# Patient Record
Sex: Male | Born: 1970
Health system: Southern US, Community
[De-identification: ages and names within clinical notes are randomized; demographics above are authoritative.]

## PROBLEM LIST (undated history)

## (undated) DIAGNOSIS — Z95810 Presence of automatic (implantable) cardiac defibrillator: Secondary | ICD-10-CM

## (undated) DIAGNOSIS — I499 Cardiac arrhythmia, unspecified: Secondary | ICD-10-CM

## (undated) DIAGNOSIS — I498 Other specified cardiac arrhythmias: Secondary | ICD-10-CM

## (undated) DIAGNOSIS — E785 Hyperlipidemia, unspecified: Secondary | ICD-10-CM

## (undated) DIAGNOSIS — R55 Syncope and collapse: Secondary | ICD-10-CM

## (undated) DIAGNOSIS — Z9289 Personal history of other medical treatment: Secondary | ICD-10-CM

## (undated) DIAGNOSIS — Z950 Presence of cardiac pacemaker: Secondary | ICD-10-CM

## (undated) DIAGNOSIS — I82409 Acute embolism and thrombosis of unspecified deep veins of unspecified lower extremity: Secondary | ICD-10-CM

## (undated) HISTORY — PX: FINGER TENDON REPAIR: SHX1640

## (undated) HISTORY — DX: Personal history of other medical treatment: Z92.89

## (undated) HISTORY — DX: Syncope and collapse: R55

## (undated) HISTORY — DX: Other specified cardiac arrhythmias: I49.8

## (undated) HISTORY — DX: Hyperlipidemia, unspecified: E78.5

## (undated) HISTORY — PX: BACK SURGERY: SHX140

---

## 1995-03-14 HISTORY — PX: LUMBAR DISC SURGERY: SHX700

## 1999-05-02 ENCOUNTER — Emergency Department (HOSPITAL_COMMUNITY): Admission: EM | Admit: 1999-05-02 | Discharge: 1999-05-02 | Payer: Self-pay

## 2000-03-29 ENCOUNTER — Ambulatory Visit (HOSPITAL_BASED_OUTPATIENT_CLINIC_OR_DEPARTMENT_OTHER): Admission: RE | Admit: 2000-03-29 | Discharge: 2000-03-29 | Payer: Self-pay | Admitting: *Deleted

## 2000-03-29 ENCOUNTER — Emergency Department (HOSPITAL_COMMUNITY): Admission: EM | Admit: 2000-03-29 | Discharge: 2000-03-29 | Payer: Self-pay | Admitting: Emergency Medicine

## 2000-03-29 ENCOUNTER — Encounter: Payer: Self-pay | Admitting: Emergency Medicine

## 2000-04-11 ENCOUNTER — Ambulatory Visit (HOSPITAL_BASED_OUTPATIENT_CLINIC_OR_DEPARTMENT_OTHER): Admission: RE | Admit: 2000-04-11 | Discharge: 2000-04-11 | Payer: Self-pay | Admitting: *Deleted

## 2000-07-16 ENCOUNTER — Emergency Department (HOSPITAL_COMMUNITY): Admission: EM | Admit: 2000-07-16 | Discharge: 2000-07-17 | Payer: Self-pay | Admitting: Emergency Medicine

## 2000-07-17 ENCOUNTER — Encounter: Payer: Self-pay | Admitting: Emergency Medicine

## 2000-08-07 ENCOUNTER — Encounter: Payer: Self-pay | Admitting: Neurosurgery

## 2000-08-07 ENCOUNTER — Ambulatory Visit (HOSPITAL_COMMUNITY): Admission: RE | Admit: 2000-08-07 | Discharge: 2000-08-07 | Payer: Self-pay | Admitting: Neurosurgery

## 2001-03-10 ENCOUNTER — Emergency Department (HOSPITAL_COMMUNITY): Admission: EM | Admit: 2001-03-10 | Discharge: 2001-03-10 | Payer: Self-pay | Admitting: Emergency Medicine

## 2001-03-10 ENCOUNTER — Encounter: Payer: Self-pay | Admitting: Emergency Medicine

## 2005-02-01 ENCOUNTER — Encounter: Admission: RE | Admit: 2005-02-01 | Discharge: 2005-05-02 | Payer: Self-pay | Admitting: Otolaryngology

## 2006-02-10 HISTORY — PX: CARDIAC DEFIBRILLATOR PLACEMENT: SHX171

## 2006-02-14 ENCOUNTER — Ambulatory Visit: Payer: Self-pay | Admitting: Cardiology

## 2006-02-15 ENCOUNTER — Ambulatory Visit: Payer: Self-pay

## 2006-02-15 ENCOUNTER — Encounter: Payer: Self-pay | Admitting: Cardiology

## 2006-02-15 ENCOUNTER — Ambulatory Visit: Payer: Self-pay | Admitting: Internal Medicine

## 2006-02-16 ENCOUNTER — Ambulatory Visit: Payer: Self-pay | Admitting: Internal Medicine

## 2006-02-16 ENCOUNTER — Inpatient Hospital Stay (HOSPITAL_COMMUNITY): Admission: RE | Admit: 2006-02-16 | Discharge: 2006-02-17 | Payer: Self-pay | Admitting: Internal Medicine

## 2006-02-27 ENCOUNTER — Ambulatory Visit: Payer: Self-pay | Admitting: Internal Medicine

## 2006-03-14 ENCOUNTER — Ambulatory Visit: Payer: Self-pay | Admitting: Internal Medicine

## 2006-04-04 ENCOUNTER — Ambulatory Visit: Payer: Self-pay | Admitting: Cardiology

## 2006-04-04 LAB — CONVERTED CEMR LAB
HCT: 42.1 % (ref 39.0–52.0)
Hemoglobin: 14.4 g/dL (ref 13.0–17.0)
MCHC: 34.2 g/dL (ref 30.0–36.0)
MCV: 92.9 fL (ref 78.0–100.0)
Platelets: 288 10*3/uL (ref 150–400)
RBC: 4.53 M/uL (ref 4.22–5.81)
RDW: 12.2 % (ref 11.5–14.6)
WBC: 7.3 10*3/uL (ref 4.5–10.5)

## 2006-05-23 ENCOUNTER — Ambulatory Visit: Payer: Self-pay | Admitting: Internal Medicine

## 2006-09-18 ENCOUNTER — Ambulatory Visit: Payer: Self-pay | Admitting: Internal Medicine

## 2006-10-16 ENCOUNTER — Ambulatory Visit: Payer: Self-pay | Admitting: Internal Medicine

## 2006-12-24 ENCOUNTER — Ambulatory Visit: Payer: Self-pay | Admitting: Internal Medicine

## 2007-03-25 ENCOUNTER — Ambulatory Visit: Payer: Self-pay | Admitting: Internal Medicine

## 2007-09-23 ENCOUNTER — Ambulatory Visit: Payer: Self-pay | Admitting: Internal Medicine

## 2007-12-23 ENCOUNTER — Ambulatory Visit: Payer: Self-pay | Admitting: Internal Medicine

## 2008-04-30 ENCOUNTER — Encounter: Payer: Self-pay | Admitting: Internal Medicine

## 2008-05-04 ENCOUNTER — Ambulatory Visit: Payer: Self-pay | Admitting: Internal Medicine

## 2008-08-03 ENCOUNTER — Ambulatory Visit: Payer: Self-pay | Admitting: Internal Medicine

## 2008-08-28 ENCOUNTER — Encounter: Payer: Self-pay | Admitting: Internal Medicine

## 2008-11-25 ENCOUNTER — Encounter: Payer: Self-pay | Admitting: Internal Medicine

## 2008-12-21 ENCOUNTER — Encounter: Payer: Self-pay | Admitting: Internal Medicine

## 2009-02-01 ENCOUNTER — Ambulatory Visit: Payer: Self-pay | Admitting: Internal Medicine

## 2009-02-01 DIAGNOSIS — Q248 Other specified congenital malformations of heart: Secondary | ICD-10-CM | POA: Insufficient documentation

## 2009-02-01 DIAGNOSIS — R55 Syncope and collapse: Secondary | ICD-10-CM | POA: Insufficient documentation

## 2009-02-22 ENCOUNTER — Telehealth: Payer: Self-pay | Admitting: Internal Medicine

## 2009-02-23 ENCOUNTER — Telehealth: Payer: Self-pay | Admitting: Internal Medicine

## 2009-03-30 ENCOUNTER — Telehealth: Payer: Self-pay | Admitting: Internal Medicine

## 2009-05-07 ENCOUNTER — Encounter: Payer: Self-pay | Admitting: Internal Medicine

## 2009-05-19 ENCOUNTER — Encounter: Payer: Self-pay | Admitting: Internal Medicine

## 2009-05-20 ENCOUNTER — Ambulatory Visit: Payer: Self-pay | Admitting: Internal Medicine

## 2009-06-02 ENCOUNTER — Telehealth: Payer: Self-pay | Admitting: Internal Medicine

## 2009-06-08 ENCOUNTER — Encounter: Payer: Self-pay | Admitting: Internal Medicine

## 2009-07-27 ENCOUNTER — Ambulatory Visit: Payer: Self-pay | Admitting: Internal Medicine

## 2009-07-27 DIAGNOSIS — Z95810 Presence of automatic (implantable) cardiac defibrillator: Secondary | ICD-10-CM | POA: Insufficient documentation

## 2009-10-29 ENCOUNTER — Encounter: Payer: Self-pay | Admitting: Internal Medicine

## 2009-11-22 ENCOUNTER — Ambulatory Visit: Payer: Self-pay | Admitting: Internal Medicine

## 2009-11-29 ENCOUNTER — Encounter: Payer: Self-pay | Admitting: Internal Medicine

## 2010-03-03 ENCOUNTER — Ambulatory Visit: Payer: Self-pay | Admitting: Internal Medicine

## 2010-03-08 ENCOUNTER — Encounter: Payer: Self-pay | Admitting: Internal Medicine

## 2010-03-22 ENCOUNTER — Encounter: Payer: Self-pay | Admitting: Internal Medicine

## 2010-04-10 ENCOUNTER — Encounter (INDEPENDENT_AMBULATORY_CARE_PROVIDER_SITE_OTHER): Payer: Self-pay | Admitting: *Deleted

## 2010-04-14 NOTE — Letter (Signed)
Summary: Device-Delinquent Phone Journalist, newspaper, Main Office  [address]   [place], [place] [address]   Phone: [phone]  Fax: [phone]     March 08, 2010 MRN: [id number]   [facility] [place] [address] [place], [place]  [address]   Dear Mr. NAZIR,  According to our records, you were scheduled for a device phone transmission on 03-03-2010.     We did not receive any results from this check.  If you transmitted on your scheduled day, please call us to help troubleshoot your system.  If you forgot to send your transmission, please send one upon receipt of this letter.  Thank you,   Architectural technologist [facility]

## 2010-04-14 NOTE — Assessment & Plan Note (Signed)
Summary: device/saf   Visit Type:  Follow-up Primary Provider:  None   History of Present Illness: Chad [name] returns today for followup.  Chad [name] is a 40 yo man with a h/o syncope and a Brugada pattern on his ECG who underwent ICD implant several years ago.  Chad [name] denies c/p, sob, or peripheral edema.  Chad [name] has had no trauma since his last clinic visit.  No intercurrent therapies from his ICD.  Chad [name] recently stopped smoking.  Current Medications (verified): 1)  Simvastatin 20 Mg Tabs (Simvastatin) .... Take One Tablet By Mouth Daily At Bedtime 2)  Omeprazole 20 Mg Cpdr (Omeprazole) .... As Needed 3)  Folic Acid 1 Mg Tabs (Folic Acid) .... Take One Tablet By Mouth Once Daily. 4)  Aspirin 81 Mg Tbec (Aspirin) .... Take One Tablet By Mouth Daily  Allergies (verified): No Known Drug Allergies  Past History:  Past Medical History: Last updated: 01/30/2008 Syncope with Brugada syndrome s/p ICD 12/07 GERD  Past Surgical History: Last updated: 01/30/2008 ICD (St. Jude Atlas) 2007  Review of Systems  Chad [name] denies chest pain, syncope, dyspnea on exertion, and peripheral edema.    Vital Signs:  [name] profile:   40 year old male Height:      67 inches Weight:      153 pounds BMI:     24.05 Pulse rate:   73 / minute BP sitting:   110 / 80  (left arm)  Vitals Entered By: Laurance Flatten CMA (Jul 27, 2009 11:51 AM)  Physical Exam  General:  Well developed, well nourished, in no acute distress.  HEENT: normal Neck: supple. No JVD. Carotids 2+ bilaterally no bruits Cor: RRR no rubs, gallops or murmur Lungs: CTA. Well healed ICD incision. Ab: soft, nontender. nondistended. No HSM. Good bowel sounds Ext: warm. no cyanosis, clubbing or edema Neuro: alert and oriented. Grossly nonfocal. affect pleasant     ICD Specifications Following MD:  Lewayne Bunting, MD     ICD Vendor:  St Jude     ICD Model Number:  [phone]     ICD Serial Number:  [id number] ICD DOI:  02/16/2006     ICD Implanting MD:   Lewayne Bunting, MD  Lead 1:    Location: RV     DOI: 02/16/2006     Model #: [id number]     Serial #: [id number]     Status: active  Indications::  BRUGADA SYNDROME   ICD Follow Up Remote Check?  No Battery Voltage:  2.97 V     Charge Time:  10.0 seconds     Underlying rhythm:  SR ICD Dependent:  No       ICD Device Measurements Right Ventricle:  Amplitude: 7.9 mV, Impedance: 465 ohms, Threshold: 4.5 V at 1.0 msec  Episodes Shock:  0     ATP:  0     Nonsustained:  0     Ventricular Pacing:  <1%  Brady Parameters Mode VVI     Lower Rate Limit:  40      Tachy Zones VF:  200     Next Remote Date:  10/28/2009     Next Cardiology Appt Due:  07/12/2010 Tech Comments:  No parameter changes.  Device function normal.  Atlas device battery 2.97.  Merlin transmissions every 3 months.  ROV 1 year with Dr. Ladona Ridgel. Altha Harm, LPN  Jul 27, 2009 11:56 AM  MD Comments:  Agree with above.  Impression & Recommendations:  Problem # 1:  AUTOMATIC  IMPLANTABLE CARDIAC DEFIBRILLATOR SITU (ICD-V45.02) His device is working normally at present.  Will recheck in several months.  Problem # 2:  OTHER SPECIFIED CONGENITAL ANOMALY HEART OTHER [id number]) Chad [name] has no ventricular arrhythmias and currently remains stable with his Brugada pattern on ECG. His updated medication list for this problem includes:    Aspirin 81 Mg Tbec (Aspirin) .Marland Kitchen... Take one tablet by mouth daily  [name] Instructions: 1)  Your physician recommends that you schedule a follow-up appointment in: 12 months with Dr Ladona Ridgel

## 2010-04-14 NOTE — Letter (Signed)
Summary: Device-Delinquent Phone Journalist, newspaper, Main Office  [address]   [place], [place] [address]   Phone: [phone]  Fax: [phone]     May 07, 2009 MRN: [id number]   [facility] [place] [address] [place], [place]  [address]   Dear Chad [name],  According to our records, you were scheduled for a device phone transmission on  May 03, 2009.     We did not receive any results from this check.  If you transmitted on your scheduled day, please call us to help troubleshoot your system.  If you forgot to send your transmission, please send one upon receipt of this letter.  Thank you,   Architectural technologist Device Clinic

## 2010-04-14 NOTE — Progress Notes (Signed)
Summary: Folic Acid refill  Phone Note Call from Patient Call back at [phone]   Caller: Patient Summary of Call: QUESTION ABOUT MEDICATION Initial call taken by: Judie Grieve,  June 02, 2009 11:45 AM    Prescriptions: FOLIC ACID 1 MG TABS (FOLIC ACID) Take one tablet by mouth once daily.  #30 x 11   Entered by:   Dennis Bast, RN, BSN   Authorized by:   Laren Boom, MD, [facility]   Signed by:   Dennis Bast, RN, BSN on 06/02/2009   Method used:   Electronically to        [facility]* (retail)       [address]       [place], [place]  [address]       Ph: [phone]       Fax: [phone]   RxID:   [id number]

## 2010-04-14 NOTE — Cardiovascular Report (Signed)
Summary: [facility]   [facility]   Imported By: Roderic Ovens 08/13/2009 15:47:33  _____________________________________________________________________  External Attachment:    Type:   Image     Comment:   External Document

## 2010-04-14 NOTE — Letter (Signed)
Summary: Device-Delinquent Phone Journalist, newspaper, Main Office  [address]   [place], [place] [address]   Phone: [phone]  Fax: [phone]     October 29, 2009 MRN: [id number]   [facility] [place] [address] [place], [place]  [address]   Dear Mr. BRUSCA,  According to our records, you were scheduled for a device phone transmission on   10-28-2009.     We did not receive any results from this check.  If you transmitted on your scheduled day, please call us to help troubleshoot your system.  If you forgot to send your transmission, please send one upon receipt of this letter.  Thank you,   Architectural technologist [facility] Clinic

## 2010-04-14 NOTE — Letter (Signed)
Summary: Remote Device Check  [employer], Main Office  [address]   [place], [place] [address]   Phone: [phone]  Fax: [phone]     June 08, 2009 MRN: [id number]   [facility] [place] [address] [place], [place]  [address]   Dear Mr. Winiarski,   Your remote transmission was recieved and reviewed by your physician.  All diagnostics were within normal limits for you.    __X____Your next office visit is scheduled for:     MAY 2011 WITH DR Ladona Ridgel. Please call our office to schedule an appointment.    Sincerely,  Proofreader

## 2010-04-14 NOTE — Letter (Signed)
Summary: Remote Device Check  [employer], Main Office  [address]   [place], [place] [address]   Phone: [phone]  Fax: [phone]     November 29, 2009 MRN: [id number]   [facility] [address] [place], [place]  [address]   Dear Chad [name],   Your remote transmission was recieved and reviewed by your physician.  All diagnostics were within normal limits for you.  __X___Your next transmission is scheduled for:  03-03-2010.  Please transmit at any time this day.  If you have a wireless device your transmission will be sent automatically.   Sincerely,  Vella Kohler

## 2010-04-14 NOTE — Cardiovascular Report (Signed)
Summary: Office Visit Remote   Office Visit Remote   Imported By: Roderic Ovens 06/10/2009 11:57:31  _____________________________________________________________________  External Attachment:    Type:   Image     Comment:   External Document

## 2010-04-14 NOTE — Progress Notes (Signed)
Summary: pt needs to pick up Rx  Phone Note Refill Request Call back at [phone] Message from:  Patient  Refills Requested: Medication #1:  FOLIC ACID 1 MG TABS Take one tablet by mouth once daily. pt needs to pick up Rx written so they can come pick it up please call whrn ready  Initial call taken by: Omer Jack,  March 30, 2009 12:11 PM Caller: Spouse    Prescriptions: FOLIC ACID 1 MG TABS (FOLIC ACID) Take one tablet by mouth once daily.  #30 x 0   Entered by:   Laurance Flatten CMA   Authorized by:   Laren Boom, MD, [facility]   Signed by:   Laurance Flatten CMA on 04/02/2009   Method used:   Electronically to        [facility]* (retail)       [address]       [place], [place]  [address]       Ph: [phone]       Fax: [phone]   RxID:   [id number]

## 2010-04-14 NOTE — Cardiovascular Report (Signed)
Summary: Office Visit Remote   Office Visit Remote   Imported By: Roderic Ovens 11/30/2009 12:44:39  _____________________________________________________________________  External Attachment:    Type:   Image     Comment:   External Document

## 2010-04-20 NOTE — Cardiovascular Report (Signed)
Summary: Office Visit Remote   Office Visit Remote   Imported By: Roderic Ovens 04/11/2010 12:20:40  _____________________________________________________________________  External Attachment:    Type:   Image     Comment:   External Document

## 2010-04-20 NOTE — Letter (Signed)
Summary: Remote Device Check  [employer], Main Office  [address]   [place], [place] [address]   Phone: [phone]  Fax: [phone]     April 10, 2010 MRN: [id number]   [facility] [place] [address] [place], [place]  [address]   Dear Mr. Cancio,   Your remote transmission was recieved and reviewed by your physician.  All diagnostics were within normal limits for you.  __X____Your next office visit is scheduled for: May 2012 with Dr Ladona Ridgel. Please call our office to schedule an appointment.    Sincerely,  Vella Kohler

## 2010-06-23 ENCOUNTER — Other Ambulatory Visit: Payer: Self-pay | Admitting: Internal Medicine

## 2010-07-26 NOTE — Assessment & Plan Note (Signed)
[facility]                         ELECTROPHYSIOLOGY OFFICE NOTE   NAME:NGOKairos, Panetta                             MRN:          [id number]  DATE:10/16/2006                            DOB:          10/07/1970    SUBJECTIVE:  Chad [name] returns today for followup.  He is a very pleasant  young man with syncope and a regatta syndrome, status post ICD  insertion.  He returns today for an unscheduled visit complaining of  feeling anxious and depressed and tired.  He denies any other specific  complaints other than the above.  He denies chest pain.  He denies  shortness of breath.  The patient states that his symptoms occur after  he has worked during the daytime, and that when he gets home he feels  fatigued and weak and has no energy.  Otherwise he had no specific  complaints today.   PHYSICAL EXAMINATION:  GENERAL:  He is a pleasant, well-appearing  [place] man, in no distress.  VITAL SIGNS:  Blood pressure 118/72, pulse 76 and regular, respirations  18, weight 168 pounds.  NECK:  No jugular venous distention.  LUNGS:  Clear bilaterally to auscultation.  No wheezes, rales or rhonchi  are present.  CARDIOVASCULAR:  A regular rate and rhythm.  Normal S1 and S2.  EXTREMITIES:  Demonstrate no edema.   Interrogation of his defibrillator demonstrates a Social research officer, government [phone].  The R-waves were 5, the impedance 415 ohm's, threshold of 1 volt at 0.5.  Battery voltage was 3.2 volts.   IMPRESSION:  1. Syncope with regatta syndrome.  2. Status post implantable cardioverter defibrillator.  3. Fatigue, weakness and feeling tired.   DISCUSSION:  I am not sure what Mr. Rosko etiology of his symptoms is  from.  I have asked that he start an exercise program, particularly when  he feels weak and tired.  He otherwise appears to be stable, and I will  plan to see him back as previously scheduled.     Doylene Canning. Ladona Ridgel, MD  Electronically Signed    GWT/MedQ  DD:  10/16/2006  DT: 10/16/2006  Job #: [id number]

## 2010-07-26 NOTE — Assessment & Plan Note (Signed)
[facility]                         ELECTROPHYSIOLOGY OFFICE NOTE   NAME:Chad [name], Chad [name]                             MRN:          [id number]  DATE:09/23/2007                            DOB:          Jan 04, 1971    Mr. Celestin returns today for followup.  He is a very pleasant [place]  male with syncope and Brugada syndrome who underwent ICD implantation  approximately 2 years ago.  He since then had no syncope.  Initially  after his device was placed, he got a shock for his sinus tachycardia.  He returns today for followup.  He denies chest pain.  He denies  shortness of breath.  Overall, he is well.   MEDICINES:  1. Aspirin 81 a day.  2. Simvastatin 20 a day.  3. Omeprazole 20 a day.  4. Folate daily.   PHYSICAL EXAMINATION:  GENERAL:  He is a pleasant well-appearing  [place] man, in no acute distress.  VITAL SIGNS:  Blood pressure was 108/80, the pulse was 85 and regular,  the respirations were 18, and the weight was 151 pounds  NECK:  No jugular venous distention.  LUNGS:  Clear bilaterally to auscultation.  No wheezes, rales, or  rhonchi were present.  CARDIOVASCULAR:  Regular rate and rhythm with normal S1 and S2.  There  were no murmurs, rubs, or gallops present.  ABDOMEN:  Soft, nontender,  and nondistended.  There was no organomegaly.  EXTREMITIES:  No edema.   Interrogation of his defibrillator demonstrates a Social research officer, government [phone].  R waves were 10, the impedance 475, a threshold of volt at 0.5  milliseconds, the battery voltage was 3.15 volts.  He was less than 1% V  paced.   IMPRESSION:  1. Probable Brugada syndrome based on EKG in a patient with a history      of syncope.  2. Status post implantable cardioverter-defibrillator insertion.   DISCUSSION:  Overall, Mr. Urenda is stable.  His defibrillator is working  normally.  He has had no intercurrent ICD therapies, and he has had no  syncope since his device was implanted.  I will plan  to see the patient  back in several months.     Doylene Canning. Ladona Ridgel, MD  Electronically Signed    GWT/MedQ  DD: 09/23/2007  DT: 09/24/2007  Job #: [id number]

## 2010-07-26 NOTE — Assessment & Plan Note (Signed)
[facility]                         ELECTROPHYSIOLOGY OFFICE NOTE   NAME:NGOHaward, [name]                             MRN:          [id number]  DATE:09/18/2006                            DOB:          1970/09/11    Chad [name] returns today for followup.  He is a very pleasant young man  with a history of syncope and Brugada EKG, who is status post ICD  implantation.  The patient is here today after receiving an ICD  discharge, which occurred several days ago while he was playing  volleyball.  He denied preceding chest pain.  He has had palpitations  previously but did not really experience any palpitations prior to this  episode.   Aspirin is his only medication.   PHYSICAL EXAMINATION:  GENERAL:  He is a pleasant young man in no acute  distress.  VITAL SIGNS:  Blood pressure was 112/78, pulse 65 and regular.  Respirations 18.  Weight was 158 pounds.  NECK:  No jugular venous distention.  LUNGS:  Clear bilaterally to auscultation.  There are no wheezes, rales  or rhonchi are present.  CARDIOVASCULAR:  Regular rate and rhythm with a normal S1 and S2.  EXTREMITIES:  No edema.   Interrogation of his defibrillator demonstrates a Social research officer, government [phone].  The R waves are 8.  The impedance is 455 ohm.  The threshold is 1 volt  at 0.5.  The battery voltage is 3.15 volts.  There was one ICD shock, as  previously noted.  He also had 26 ATPs secondary to higher rates.   Today we re-programmed his device, getting rid of the monitor zone for  VT and taking his VT rate up to 200 beats per minute.  We also increased  the number required for treatment to 16.   IMPRESSION:  1. Implantable cardioverter/defibrillator shock secondary to sinus      tachycardia.  2. Likely Brugada syndrome with syncope and a Brugada      electrocardiogram.  3. Status post implantable cardioverter/defibrillator  insertion.   Chad [name] has stable pacemaker defibrillator program.  Adjustments  have  been made, as previously outlined.  We will plan to see him back in  several months.     Doylene Canning. Ladona Ridgel, MD  Electronically Signed    GWT/MedQ  DD: 09/18/2006  DT: 09/18/2006  Job #: [id number]

## 2010-07-29 NOTE — Consult Note (Signed)
[facility]  Patient:    Chad [name], Chad [name] Visit Number: [id number] MRN: [id number]          Service Type: Attending:  Artist Pais. Mina Marble, M.D. Dictated by:   Artist Pais Mina Marble, M.D.                            Consultation Report  REQUESTING PHYSICIAN:  Osvaldo Human, M.D.  REASON FOR CONSULTATION:  Mr. 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  Got his nondominant left long finger crushed and presents today with an open injury.  He is an otherwise 40 year old male.  ALLERGIES:  No known drug allergies.  CURRENT MEDICATIONS:  None.  PAST SURGICAL HISTORY:  No recent hospitalization or surgery.  FAMILY HISTORY:  Noncontributory.  SOCIAL HISTORY:  Noncontributory.  PHYSICAL EXAMINATION  GENERAL:  Well-developed, well-nourished male, pleasant, alert and oriented x 3.  EXTREMITIES:  Long finger:  He has an obvious open injury to the distal aspect of his left long finger.  He has a fractured nail plate and open nail bed laceration and probable distal phalangeal fracture.  He has normal sensation in the other digits.  The tip of his long finger, however, is insensate secondary to the crush component.  He also has a volar laceration that extends from radial to ulnar on the distal phalanx.  He can make a composite fist.  He has 2+ radial pulse ______.  Skin is otherwise intact.  No erythema, drainage, signs of infection.  LABORATORIES:  X-rays show a nondisplaced distal phalangeal fracture.  IMPRESSION:  Approximately a 40 year old male with an open distal phalangeal fracture, nail plate fracture, nail bed laceration.  Patient was anesthetized with 1% plain lidocaine.  After adequate anesthesia was obtained he was prepped and draped in the usual sterile fashion.  The fractured nail plate was carefully removed from underneath the eponychial fold.  The open fracture  was debrided of clot and nonviable material including large amounts of grease.  It was copiously irrigated with Betadine and normal saline until the majority of the grease was removed.  At this point in time the skin was approximated using 5-0 nylon.  The nail bed was repaired with 6-0 undyed Vicryl and after this was done the patient was placed in a sterile dressing of xeroform 4 x 4 Plus and a Coban wrap as well as a volar splint.  Patient tolerated procedure well. Was discharged from the emergency department with Keflex 500 mg one p.o. q.i.d. for a week, Vicodin #30 for pain.  Follow up in my office in five to seven days. Dictated by:   Artist Pais Mina Marble, M.D. Attending:  Artist Pais Mina Marble, M.D. DD:  03/10/01 TD:  03/10/01 Job: [id number]

## 2010-07-29 NOTE — Op Note (Signed)
. [facility]  Patient:    Chad [name], Chad [name]                             MRN: [id number] Proc. Date: 03/29/00 Adm. Date:  [id number] Attending:  Kendell Bane CC:         Harrel Lemon. Merla Riches, M.D.   Operative Report  PREOPERATIVE DIAGNOSIS:  Amputation of pulp, left index finger.  POSTOPERATIVE DIAGNOSIS:  Amputation of pulp, left index finger.  PROCEDURES: 1. Cross-finger flap from left middle finger to left index finger. 2. Full-thickness skin graft from left arm to left middle finger.  SURGEON:  Lowell Bouton, M.D.  ANESTHESIA:  General.  OPERATIVE FINDINGS:  The patient had a 2 x 1.5 cm defect that included the entire pulp of the left index finger.  DESCRIPTION OF PROCEDURE:  Under general anesthesia with a tourniquet on the left arm, the left hand was prepped and draped in the usual fashion, and bleeding was controlled on the pulp with electrocautery.  After bleeding was controlled, the flap was marked out with a marking pen on the dorsum of the middle finger, and the full-thickness graft was marked out on the medial side of the distal arm.  The limb was then exsanguinated and the tourniquet inflated to 225 mmHg.  An incision was made on the dorsum of the middle finger, forming a radially-based flap between the PIP and DIP joints.  It was elevated ulnarly and brought over to cross onto the pulp of the index finger. The flap fit well and was taken down to the extensor mechanism, leaving the paratenon on the tendon.  After elevating the flap, it was wrapped in saline-soaked gauze and was perfused radially.  A full-thickness skin graft was then taken from the medial distal arm measuring 2 x 1.5 cm in an oval shape.  This was taken free-hand with a knife, and the graft was placed in a saline-soaked sponge.  The donor site on the medial arm was then undermined with a knife.  Bleeding was controlled with electrocautery.  The  wound was irrigated, and the donor site was closed with a 4-0 Vicryl in the subcutaneous tissues and a 4-0 subcuticular Prolene in the skin.  Steri-Strips were applied.  The skin graft was then divided in half and was applied to the rectangular-shaped defect on the dorsum of the middle finger middle phalanx. It was held in place with 4-0 nylon sutures, and multiple puncture wounds were made for drainage underneath the graft.  A cotton ball bolster was applied and sewn into place with 4-0 nylon sutures on the dorsum of the middle finger overlying the full-thickness skin graft.  The cross-finger flap was then attached to the tip of the index finger, connecting the two digits using a 4-0 nylon suture.  The fingers were placed in a comfortable position.  The wounds were dressed with Adaptic and sterile dressings, along with fluffs in the palm and the hand.  The hand was protected with a dorsal protective splint of plaster.  The tourniquet was released with good circulation of the hand.  The patient went to the recovery room awake and stable, in good condition. DD:  03/29/00 TD:  03/30/00 Job: [id number]

## 2010-07-29 NOTE — Assessment & Plan Note (Signed)
[facility]                            CARDIOLOGY OFFICE NOTE   Chad [name], Chad [name]                             MRN:          [id number]  DATE:02/14/2006                            DOB:          1970/04/05    The patient is a 40 year old [place] male, who I am asked to evaluate  for syncope.  The patient has no prior cardiac history by report.  However, for the past five to six years, he has had episodes of sudden  onset chest tightness, dyspnea, followed by palpitations.  There is also  dizziness.  This typically lasts approximately 10-15 minutes and  resolves spontaneously.  These are not related to exertion and can occur  at any time.  They are approximately five to six times per year.  He  recently traveled to [place] and, when he arrived in the airport there,  in early October, he had one of these episodes in the airport and had a  frank syncopal episode.  He apparently was seen and was told he had an  abnormal electrocardiogram.  He was also placed on a medication, but I  do not know what that is and he is not taking it at present.  Dr. Cleta Alberts  saw him after he returned home and noticed that he had an abnormal EKG.  We were, therefore, asked to further evaluate for his abnormal  electrocardiogram and syncope.  Note:  He otherwise does have fatigue at  times, but he typically does not have significant dyspnea on exertion,  orthopnea, PND, pedal edema or exertional chest pain.   MEDICATIONS INCLUDE:  Aspirin 81 mg p.o. daily and Metanx.   ALLERGIES:  He has no known drug allergies.   SOCIAL HISTORY:  He does smoke and he also consumes approximately six  alcoholic beverages per weekend.  He denies any drug use.   FAMILY HISTORY:  Significant for a valve problem in his mother.  However, there is no sudden death by his report and no coronary disease.   PAST MEDICAL HISTORY:  There is no diabetes mellitus or hypertension,  but there apparently has  been mild hyperlipidemia.  There is also a  history of gastroesophageal reflux disease.  He has had prior back  surgery and also hand surgery.  He had surgery on his throat for voice  problems.   REVIEW OF SYSTEMS:  He denies any headaches, fevers or chills.  There is  no productive cough or hemoptysis.  There is no dysphagia, odynophagia,  melena, or hematochezia.  There is no dysuria or hematuria.  There is no  rash or seizure activity.  There is no orthopnea, PND or pedal edema.  Remaining systems are negative, other than fatigue.   PHYSICAL EXAMINATION:  VITAL SIGNS:  His physical exam today shows a  blood pressure of 112/74 and his pulse is 75.  He weighs 156 pounds.  GENERAL:  He is well-developed, well-nourished, and in no acute  distress.  SKIN:  His skin is warm and dry.  PSYCHIATRIC:  He does not appear to be  depressed.  There is no peripheral clubbing.  His back is normal.  LYMPH:  I cannot appreciate any adenopathy on exam.  HEENT:  Unremarkable with normal eyelids.  NECK:  His neck is supple with normal upstroke bilaterally.  I cannot  appreciate bruits.  There is no jugular venous distention and no  thyromegaly is noted.  CHEST:  His chest is clear to auscultation __________  his  cardiovascular exam reveals a regular rate and rhythm, normal S1 and S2.  There are no murmurs, rubs or gallops noted.  ABDOMINAL EXAM:  Nontender, nondistended.  Positive bowel sounds, no  hepatosplenomegaly, no masses appreciated.  There is no abdominal bruit.  He has 2+ femoral pulses bilaterally, no bruits.  EXTREMITIES:  Show no edema and I can palpate no cords.  He has 2+  dorsalis pedis pulses bilaterally.  NEUROLOGIC EXAM:  Grossly intact.   His electrocardiogram shows a sinus rhythm at a rate of 75.  There is a  RSR prime in V1 and V2 with ST changes in V1 and V2.  His EKG is  concerning for Brugada syndrome.   DIAGNOSES:  1. Probable Brugada syndrome.  2. Recent syncopal  episode.  3. History of gastroesophageal reflux disease.  4. Tobacco abuse.   PLAN:  [name] presents with episodes of palpitations and presyncope and  now a syncopal episode.  His electrocardiogram is concerning for Brugada  syndrome.  We will plan to proceed with an echocardiogram to quantify  his left ventricular function, as well as a TSH, CMET and CBC.  I will  have one of our electrophysiologists evaluate the patient tomorrow  morning for consideration of ICD placement and further workup.  I have  explained this to him and his wife in detail and I have explained the  potential serious nature of this illness.  They seem to understand and  are scheduled to see Dr. Graciela Husbands tomorrow morning.     Chad Frieze Jens Som, MD, [facility]  Electronically Signed    BSC/MedQ  DD: 02/14/2006  DT: 02/14/2006  Job #: [id number]   cc:   Brett Canales A. Cleta Alberts, M.D.  Robert P. Merla Riches, M.D.

## 2010-07-29 NOTE — Assessment & Plan Note (Signed)
[facility]                         ELECTROPHYSIOLOGY OFFICE NOTE   NAME:NGOBraxley, Balandran                             MRN:          [id number]  DATE:05/23/2006                            DOB:          Sep 19, 1970    Mr. Chad [name] is a very pleasant [place] man with a history of syncope and  EKG consistent with Brugada syndrome who underwent ICD implantation back  in December.  He returns today for followup.  His only complaint today  is that of itching sensations under the device pocket itself.  He denies  palpitations, chest pain or shortness of breath.  He has had no syncope  since his defibrillator was implanted. On exam, he is a pleasant, well-  appearing young man in no distress.  Blood pressure today was 140/88,  the pulse 66 and regular, the respirations were 18, the weight was 165  pounds, up 4 pounds from his visit back in January.  When I saw him back  then, the patient complained of left arm and left shoulder discomfort.  This has resolved.  The rest of his exam is notable for a blood pressure  of 140/88, the pulse 66 and regular, the respirations were 18, the  weight was 165 pounds.  The neck revealed no jugular venous distention.  The lungs were clear bilaterally to auscultation.  There were no  wheezes, rales or rhonchi.  The cardiovascular exam revealed a regular  rate and rhythm with normal S1 and S2.  Extremities demonstrated no  cyanosis, clubbing or edema.   Interrogation of his defibrillator demonstrates a [employer] research officer, government [phone].  R waves are 7, the impedance 550 ohms, the threshold 0.075 volts at 0.5  milliseconds.  The battery voltage was 3.2 volts.   IMPRESSION:  1. Brugada syndrome.  2. Syncope.  3. Status post implantable cardioverter-defibrillator insertion.   DISCUSSION:  Overall, Mr. Chad [name] is stable.  His defibrillator is working  normally.  We will see him back in 4 months for followup.     Doylene Canning. Ladona Ridgel, MD  Electronically  Signed    GWT/MedQ  DD: 05/23/2006  DT: 05/25/2006  Job #: [id number]

## 2010-07-29 NOTE — Discharge Summary (Signed)
NAME:  Chad [name], Chad [name] NO.:  [id number]   MEDICAL RECORD NO.:  [id number]          PATIENT TYPE:  INP   LOCATION:  2041                         FACILITY:  [facility]   PHYSICIAN:  Luis Abed, MD, FACCDATE OF BIRTH:  12/17/1970   DATE OF ADMISSION:  02/16/2006  DATE OF DISCHARGE:  02/17/2006                               DISCHARGE SUMMARY   PRIMARY CARDIOLOGIST:  Dr. Lewayne Bunting.   PRINCIPAL DIAGNOSIS:  1. Recurrent syncope.      a.     Brugada syndrome.      b.     Status post St. Jude single chamber defibrillator       implantation this admission.   SECONDARY DIAGNOSES:  1. Tobacco.  2. Gastroesophageal reflux disease.   REASON FOR ADMISSION:  Chad [name] is a 40 year old male, recently referred  to Dr. Olga Millers in the clinic for further evaluation of syncope.  Electrocardiogram was concerning for Brugada syndrome, and he was  subsequently referred for electrophysiology evaluation.   HOSPITAL COURSE:  The patient was admitted for elective placement of a  defibrillator, successfully implanted by Dr. Lewayne Bunting on date of  admission, with placement of a St. Jude Atlas Plus VR Model V-193 single  chamber defibrillator.  There were no noted complications and the  patient was cleared for discharge the following morning, in  hemodynamically stable condition.   The patient is to resume previous home medication regimen.   DISCHARGE LABORATORIES:  None.   OUTSTANDING LABORATORIES:  Normal hemoglobin/hematocrit and platelets,  WBC 10.9.  Sodium 138, potassium 3.5, glucose 127, BUN 11, creatinine  0.8 with normal liver enzymes.  TSH 0.51, ALT 51 (preadmission).   POSTPROCEDURE CHEST X-RAY:  No pneumothorax.   DISCHARGE MEDICATIONS:  1. Coated aspirin 81 mg daily.  2. Metanx as previously directed.   INSTRUCTIONS:  The patient is referred to standard post-pacemaker  discharge sheet for full instructions.   FOLLOWUP:  Arrangements will be made for the  patient to follow up with  Dr. Lewayne Bunting.   DISPOSITION:  Stable.      Gene Serpe, PA-C      Luis Abed, MD, [facility]  Electronically Signed    GS/MEDQ  D:  02/17/2006  T:  02/17/2006  Job:  [id number]   cc:   Brett Canales A. Cleta Alberts, M.D.

## 2010-07-29 NOTE — Assessment & Plan Note (Signed)
[facility]                         ELECTROPHYSIOLOGY OFFICE NOTE   NAME:Chad [name], Chad [name]                             MRN:          [id number]  DATE:02/27/2006                            DOB:          April 22, 1970    Chad [name] is a very pleasant [place] male with syncope secondary to  Brugada syndrome who is status post recent ICD implantation on December  7.  He returns today for followup.  The patient's complaint today is of  left arm pain since his ICD was placed.  He states that he has had  difficulty sleeping at night time.  The pain is worse when he moves his  arm posteriorly and when he extends it.  He denies swelling at the  insertion. He denies fevers or chills.   PHYSICAL EXAMINATION:  GENERAL:  He is a pleasant, well appearing young  man in no distress.  VITAL SIGNS:  Blood pressure today 124/88, pulse 70 and regular,  respirations 18.  Weight 157 pounds.  NECK:  Revealed no jugular venous distension.  LUNGS:  Clear bilaterally to auscultation.  CARDIOVASCULAR:  Regular rate and rhythm with normal S1 and S2.  The  insertion site was healing nicely with no significant erythema.  Evaluation of the left shoulder demonstrated no crepitus. There was no  effusion noted.  There was minimal amounts of tenderness over the  trapezius muscle and over the deltoid.   Otherwise his exam was normal.   IMPRESSION:  1. Left arm pain, status post implantable cardioverter defibrillator      insertion.  Discussing the pain, it appears that of bursitis in      nature.  I have given him a prescription today for Toradol after he      comes back to see me two weeks.  I have recommended he keep moving      the arm and shoulder joint but not do any heavy lifting with it.      __________.     Doylene Canning. Ladona Ridgel, MD     GWT/MedQ  DD: 02/27/2006  DT: 02/27/2006  Job #: [id number]

## 2010-07-29 NOTE — Op Note (Signed)
[place]  Patient:    Chad [name], Chad [name]                             MRN: [id number] Proc. Date: 04/11/00 Adm. Date:  [id number] Attending:  MeyerdierksEmelda Fear                           Operative Report  PREOPERATIVE DIAGNOSIS:  Status post cross finger flap, left middle to index fingertip.  POSTOPERATIVE DIAGNOSIS:  Status post cross finger flap, left middle to index fingertip.  OPERATION PERFORMED:  Division of cross finger flap, left middle to index finger tip.  SURGEON:  Lowell Bouton, M.D.  ANESTHESIA:  General.  OPERATIVE FINDINGS:  The patient had a viable flap and the wounds closed easily after dividing it.  DESCRIPTION OF PROCEDURE:  Under general anesthesia, the left hand was prepped and draped in the usual fashion and the previous cotton bolster was removed. The full thickness skin graft appeared viable on the dorsum of the middle finger.  The flap was divided and pressure was applied to the bleeding edges. The laceration was repaired on the index finger and also on the middle finger. After closing the wounds at the edges of the flap, sterile dressings were applied.  The patient went to the recovery room awake and stable in good condition.DD:  04/11/00 TD:  04/11/00 Job: [id number]

## 2010-07-29 NOTE — Op Note (Signed)
NAME:  DARRIK, RICHMAN NO.:  [id number]   MEDICAL RECORD NO.:  [id number]          PATIENT TYPE:  INP   LOCATION:  2807                         FACILITY:  [facility]   PHYSICIAN:  Doylene Canning. Ladona Ridgel, MD    DATE OF BIRTH:  10-07-1970   DATE OF PROCEDURE:  02/16/2006  DATE OF DISCHARGE:                               OPERATIVE REPORT   PROCEDURE PERFORMED:  Implantation of a single chamber defibrillator.   INDICATION:  Syncope with Brugada syndrome.   INTRODUCTION:  The patient is a 40 year old [place] man who has had a  history of syncope in the past.  He was subsequently found at medical  attention to have any EKG diagnostic of Brugada syndrome.  Because of  syncope and documented EKG demonstrating Brugada syndrome and his marked  increased risk for sudden cardiac death the patient is referred now for  ICD implantation.   PROCEDURE:  After informed was obtained, the patient was taken to the  diagnostic EP lab in the fasting state.  After the usual preparation and  draping, intravenous fentanyl and midazolam was given for sedation.  We  infiltrated 30 mL lidocaine into the left infraclavicular region.  A 7  cm incision was carried out over this region.  Electrocautery utilized  to dissect down to the fascial plane.  We injected 10 mL of contrast  into the left upper extremity venous system demonstrating the patent  left subclavian vein.  It was subsequently punctured x1 and the St. Jude  model 7001 65 cm defibrillation lead, serial number [id number] was  advanced into the right ventricle.  Mapping was carried out in the right  ventricle and at the final site the R-waves were initially 10 mV and the  impedance was 896 ohms.  There was a nice entry current, the pacing  threshold 0.9 volts at 0.5 milliseconds.  With these satisfactory  parameters and with lead actively fixed, 10 volt pacing was carried out  demonstrating no diaphragmatic stimulation.  At this point, the  lead was  fixed to the pectoralis fascia with a figure-of-eight silk suture and  the sewing sleeve was also secured with silk suture.  At this point, the  electrocautery was used to make a subcutaneous pocket and kanamycin  irrigation was utilized to irrigate the pocket.  The St. Jude Atlas Plus  VR model V - 193 single chamber defibrillator, serial number [id number] was  connected to the defibrillation lead and placed back in the subcutaneous  pocket.  Generator was secured with silk suture.  At this point, the  patient was more deeply sedated and defibrillation threshold testing  carried out.   After the patient was more deeply sedated with fentanyl and Versed, VF  was induced with a T-wave shock and a 15 joule shock was initially  delivered which failed to terminate VF.  A second 25 joule shock was  then delivered and this terminated VF and restored sinus rhythm.  Five  minutes was allowed to elapse and the second defibrillation threshold  testing was carried out.  VF was  again induced with T-wave shock again a  25 joule shock was delivered which terminated VF and restored sinus  rhythm.  At this point, no additional defibrillation threshold testing  was carried out and the incision was closed with a layer of 2-0 Vicryl,  followed by a layer of 3-0 Vicryl, followed by a layer of 4-0 Vicryl.  Benzoin was painted on the skin and Steri-Strips were applied and  pressure dressing was placed and the patient was returned to his room in  satisfactory condition.   COMPLICATIONS:  There were no immediate procedure complications.   RESULTS:  This demonstrates successful implantation of a St. Jude single  chamber defibrillator in a patient with syncope and EKG demonstrable of  Brugada syndrome.      Doylene Canning. Ladona Ridgel, MD  Electronically Signed     GWT/MEDQ  D:  02/16/2006  T:  02/16/2006  Job:  [id number]   cc:   Jonita Albee, M.D.  Madolyn Frieze Jens Som, MD, [facility]  Duke Salvia, MD, [facility]

## 2010-07-29 NOTE — Letter (Signed)
February 15, 2006    Madolyn Frieze. Jens Som, MD, [facility]  [address]  [address]  [place], [place] [address]   RE:  PAARTH, CROPPER  MRN:  [id number]  /  DOB:  1970/09/27   Dear Arlys John:   It was a pleasure to see Mr. Aungst today in consultation regarding his  abnormal electrocardiogram and syncope.   Mr. Mantione is a 40 year old [place] man who has a recurrent history of  palpitations associated with chest tightness and shortness of breath.  He also had an episode of syncope when he arrived in [place] to visit his  family.  He went to a [place] there where they told him his  electrocardiogram was very worrisome.  He came back, sought cardiology  consultation, which prompted the referral to you and you very astutely  picked up the electrocardiogram consistent with Brugada syndrome.   His past related history is quite interesting in that he is the son of a  [place] woman and an African-American soldier.  This resulted in him  having to be raised by his aunt from a cultural point of view.   He also describes episodes of anxiety, anger, shortness of breath, and  diaphoresis when he is involved in crowds.  He is currently unemployed.   His family history, as you can gather from the above, is sparse with  only half-brothers and -sisters and there is a family history of sudden  death in one of his half-brothers that occurred in the time that he was  48, although there is some comment that he was ill around that time.   His past medical history in addition to the above is broadly negative.   His prior surgeries include index finger surgery, lumbar displacement  surgery, and throat surgery.   He smokes currently a half a pack per day.  He does use alcohol.  He  does not use recreational drugs.   He has no known drug allergies.   He is married.  He has a 56-year-old daughter.   He takes no medications.   On examination, he is a middle-aged Asian male.  His blood pressure is  102/82,  his pulse was 76, his weight was 156.  His HEENT exam  demonstrated no icterus or xanthomata.  The neck veins were flat.  The  carotids were brisk and full bilaterally without bruits.  The back was  without kyphosis or scoliosis.  The lungs were clear.  Heart sounds were  regular without murmurs or gallops.  The abdomen was soft with active  bowel sounds without midline pulsation or hepatomegaly.  Femoral pulses  were 2+, distal pulses were intact, and there was no cyanosis, clubbing  or edema.  Neurologic exam was grossly normal.   Electrocardiogram has a typical Brugada appearance of a type 1  electrocardiogram.  There is a series of electrocardiograms.  There is 2-  3 mm of ST segment elevation in leads V1 to V2 and this is noted as  variable over electrocardiograms dating back 2 years.   IMPRESSION:  1. Brugada type 1 electrocardiogram.  2. Syncope associated with the above, although I am not sure the      syncope is related.  3. Family history.  4. Anxiety disorder, question agoraphobia.   Arlys John, I think Mr. Dungan has enough to justify ICD implantation with his  family history, syncope, and his type 1 electrocardiogram.  I am not  sure that it will take care of all  of his symptoms and I will plan to  talk to Dr. Cleta Alberts about the anxiety portion of this, because I think this  is a significant portion.  I do not know whether the psychosocial stress  of his birth and having to live in the stress of his origin has an  impact or not.  They were agreeable to me addressing this with him.   In any case, I had a lengthy discussion with them also regarding ICD  therapy, the potential benefits as well as the potential risks,  including but not limited to death, perforation, infection,  lead dislodgement, and device malfunction.  They understand these risks  and would like to proceed.   This will plan to be done tomorrow by Dr. Ladona Ridgel.    Sincerely,      Duke Salvia, MD, [facility]   Electronically Signed    SCK/MedQ  DD: 02/15/2006  DT: 02/15/2006  Job #: [id number]   CC:    Harrel Lemon. Merla Riches, M.D.

## 2010-07-29 NOTE — Assessment & Plan Note (Signed)
[facility]                         ELECTROPHYSIOLOGY OFFICE NOTE   NAME:NGOJru, Pense                             MRN:          [id number]  DATE:03/14/2005                            DOB:          02-Feb-1971    Mr. Heinle is a very pleasant [place] man with a history of syncope and  Brugada syndrome who is status post ICD insertion back one month ago.  He was seen in our office on the 18th of December, complaining of left  arm soreness following his device implant.  This pain at that time was  localized to the deltoid region, and the posterior part of the scapula.  He had no significant swelling at the insertion site, and was otherwise  stable.  He was given a prescription for Toradol, which improved his  symptoms, though did not relieve them completely.  He returns today for  followup.  The patient has no fevers or chills and has otherwise been  stable.  His wife does note that he had one episode where he coughed up  a small amount of blood.  He denies fevers or chills or other symptoms.  He otherwise denies nausea or vomiting.   PHYSICAL EXAMINATION:  GENERAL:  On exam, he is a pleasant, well-  appearing young man in no distress.  VITAL SIGNS:  Blood pressure today was 100/80.  The pulse was 70 and  regular.  Respirations were 18.  NECK:  Revealed no jugular venous distention.  There is no thyromegaly.  Trachea was midline.  The lungs were clear bilaterally to auscultation.  No wheezes, rales or rhonchi.  CARDIOVASCULAR:  Revealed a regular rate and rhythm with normal S1 and  S2.  EXTREMITIES:  Demonstrated no edema.  The ICD insertion site was healing  nicely.  There was a piece of stitch hanging out from the incision, and  I clipped this.  Exam of the left shoulder demonstrates no hematomas and  no tenderness.  There is no fluctuance.  There is full range of motion  of the left shoulder joint.   IMPRESSION:  1. Left shoulder pain and left back  pain following implantable cardiac      defibrillator insertion.  2. Brugada syndrome with syncope.  3. One episode of hemoptysis in the absence of others, symptomatic and      systemic illness and symptoms.   DISCUSSION:  I have asked Mr. Czerwinski to continue his Toradol.  There is a  question about having some antibiotics prior to dental work, and I have  recommended that he go ahead and take his antibiotics (penicillin).  If  he has any recurrent hemoptysis, (he notes only a very trace amount of  blood and one episode of coughing), and he is instructed to call his  primary physician for additional evaluation and followup.     Doylene Canning. Ladona Ridgel, MD  Electronically Signed    GWT/MedQ  DD: 03/14/2006  DT: 03/14/2006  Job #: [id number]

## 2010-09-12 ENCOUNTER — Encounter: Payer: Self-pay | Admitting: Internal Medicine

## 2010-09-13 ENCOUNTER — Encounter: Payer: Self-pay | Admitting: Internal Medicine

## 2010-09-13 ENCOUNTER — Ambulatory Visit (INDEPENDENT_AMBULATORY_CARE_PROVIDER_SITE_OTHER): Payer: Commercial Managed Care - PPO | Admitting: Internal Medicine

## 2010-09-13 DIAGNOSIS — E785 Hyperlipidemia, unspecified: Secondary | ICD-10-CM | POA: Insufficient documentation

## 2010-09-13 DIAGNOSIS — E782 Mixed hyperlipidemia: Secondary | ICD-10-CM

## 2010-09-13 DIAGNOSIS — Z95810 Presence of automatic (implantable) cardiac defibrillator: Secondary | ICD-10-CM

## 2010-09-13 DIAGNOSIS — I428 Other cardiomyopathies: Secondary | ICD-10-CM

## 2010-09-13 DIAGNOSIS — Q248 Other specified congenital malformations of heart: Secondary | ICD-10-CM

## 2010-09-13 LAB — ICD DEVICE OBSERVATION
BRDY-0002RV: 40 {beats}/min
DEV-0020ICD: NEGATIVE
HV IMPEDENCE: 50 Ohm
RV LEAD IMPEDENCE ICD: 455 Ohm
TOT-0006: 20120703000000
TOT-0007: 2
TOT-0010: 32
VENTRICULAR PACING ICD: 0 pct

## 2010-09-13 MED ORDER — FOLIC ACID 1 MG PO TABS: 1.0000 mg | ORAL_TABLET | Freq: Every day | ORAL | Status: DC

## 2010-09-13 MED ORDER — SIMVASTATIN 20 MG PO TABS: 20.0000 mg | ORAL_TABLET | Freq: Every day | ORAL | Status: DC

## 2010-09-13 MED ORDER — OMEPRAZOLE 20 MG PO CPDR: 20.0000 mg | DELAYED_RELEASE_CAPSULE | ORAL | Status: DC | PRN

## 2010-09-13 NOTE — Assessment & Plan Note (Signed)
He has had no ICD discharges. His device is working normally. We'll recheck in several months.

## 2010-09-13 NOTE — Assessment & Plan Note (Signed)
He will continue his current medications. A low-fat diet is recommended.

## 2010-09-13 NOTE — Progress Notes (Signed)
HPI Chad [name] returns today for followup. He is a pleasant 40 year old man with a history of syncope and a Brugrada pattern on his EKG. He is status post ICD implantation. He denies ICD shock. He has no chest pain or shortness of breath. He has fleeting stinging sensations around his ICD site which lasts a second or 2. No Known Allergies   Current Outpatient Prescriptions  Medication Sig Dispense Refill  . aspirin 81 MG EC tablet Take 81 mg by mouth daily.        . folic acid (FOLVITE) 1 MG tablet TAKE 1 TABLET BY MOUTH EVERY DAY  30 tablet  0  . omeprazole (PRILOSEC) 20 MG capsule Take 20 mg by mouth as needed.        . simvastatin (ZOCOR) 20 MG tablet Take 20 mg by mouth at bedtime.           Past Medical History  Diagnosis Date  . Syncope     s/p ICD 12/07  . Brugada syndrome     s/p ICD 12/07  . GERD (gastroesophageal reflux disease)     ROS:   All systems reviewed and negative except as noted in the HPI.   Past Surgical History  Procedure Date  . Cardiac defibrillator placement 02/2006    St. Jude Atlas     No family history on file.   History   Social History  . Marital Status: Married    Spouse Name: N/A    Number of Children: N/A  . Years of Education: N/A   Occupational History  . Not on file.   Social History Main Topics  . Smoking status: Smoker, Current Status Unknown  . Smokeless tobacco: Not on file   Comment: Ongoing tobacco abuse  . Alcohol Use: No  . Drug Use: Not on file  . Sexually Active: Not on file   Other Topics Concern  . Not on file   Social History Narrative   Married     BP 110/78  Pulse 70  Ht 5\' 7"  (1.702 m)  Wt 163 lb (73.936 kg)  BMI 25.53 kg/m2  Physical Exam:  Well appearing NAD HEENT: Unremarkable Neck:  No JVD, no thyromegally Lymphatics:  No adenopathy Back:  No CVA tenderness Lungs:  Clear. Well-healed ICD incision. HEART:  Regular rate rhythm, no murmurs, no rubs, no clicks Abd:  Flat, positive bowel  sounds, no organomegally, no rebound, no guarding Ext:  2 plus pulses, no edema, no cyanosis, no clubbing Skin:  No rashes no nodules Neuro:  CN II through XII intact, motor grossly intact  DEVICE  Normal device function.  See PaceArt for details.   Assess/Plan:

## 2010-09-13 NOTE — Patient Instructions (Signed)
Your physician wants you to follow-up in:12 months with Dr.taylor You will receive a reminder letter in the mail two months in advance. If you don't receive a letter, please call our office to schedule the follow-up appointment.

## 2010-09-13 NOTE — Assessment & Plan Note (Signed)
His Brugada syndrome is asymptomatic at this point in time. He will call us if he has syncope or an ICD shock.

## 2010-11-25 ENCOUNTER — Telehealth: Payer: Self-pay | Admitting: *Deleted

## 2010-11-25 DIAGNOSIS — T82198A Other mechanical complication of other cardiac electronic device, initial encounter: Secondary | ICD-10-CM

## 2010-11-25 NOTE — Telephone Encounter (Signed)
Checking lead 

## 2010-12-15 ENCOUNTER — Encounter: Payer: Commercial Managed Care - PPO | Admitting: *Deleted

## 2010-12-19 ENCOUNTER — Encounter: Payer: Self-pay | Admitting: *Deleted

## 2010-12-30 ENCOUNTER — Ambulatory Visit (INDEPENDENT_AMBULATORY_CARE_PROVIDER_SITE_OTHER)
Admission: RE | Admit: 2010-12-30 | Discharge: 2010-12-30 | Disposition: A | Discharge status: Home / Self Care | Payer: Commercial Managed Care - PPO | Source: Ambulatory Visit | Attending: Internal Medicine | Admitting: Internal Medicine

## 2010-12-30 DIAGNOSIS — T82198A Other mechanical complication of other cardiac electronic device, initial encounter: Secondary | ICD-10-CM

## 2011-02-09 ENCOUNTER — Ambulatory Visit (INDEPENDENT_AMBULATORY_CARE_PROVIDER_SITE_OTHER): Payer: Commercial Managed Care - PPO | Admitting: *Deleted

## 2011-02-09 DIAGNOSIS — Z95810 Presence of automatic (implantable) cardiac defibrillator: Secondary | ICD-10-CM

## 2011-02-09 DIAGNOSIS — I428 Other cardiomyopathies: Secondary | ICD-10-CM

## 2011-02-10 ENCOUNTER — Encounter: Payer: Self-pay | Admitting: Internal Medicine

## 2011-02-10 ENCOUNTER — Other Ambulatory Visit: Payer: Self-pay | Admitting: Internal Medicine

## 2011-02-10 LAB — REMOTE ICD DEVICE
BRDY-0002RV: 40 {beats}/min
DEV-0020ICD: NEGATIVE
RV LEAD AMPLITUDE: 10.9 mv

## 2011-02-14 NOTE — Progress Notes (Signed)
Remote icd check  

## 2011-02-17 ENCOUNTER — Encounter: Payer: Self-pay | Admitting: *Deleted

## 2011-05-18 ENCOUNTER — Encounter: Payer: Commercial Managed Care - PPO | Admitting: *Deleted

## 2011-05-22 ENCOUNTER — Encounter: Payer: Self-pay | Admitting: *Deleted

## 2011-06-01 ENCOUNTER — Encounter: Payer: Commercial Managed Care - PPO | Admitting: *Deleted

## 2011-06-01 ENCOUNTER — Encounter: Payer: Self-pay | Admitting: Internal Medicine

## 2011-06-01 DIAGNOSIS — R55 Syncope and collapse: Secondary | ICD-10-CM

## 2011-06-02 LAB — REMOTE ICD DEVICE
BATTERY VOLTAGE: 2.59 V
DEV-0020ICD: NEGATIVE
RV LEAD AMPLITUDE: 9.7 mv
RV LEAD IMPEDENCE ICD: 450 Ohm

## 2011-06-12 ENCOUNTER — Encounter: Payer: Self-pay | Admitting: *Deleted

## 2011-10-31 ENCOUNTER — Encounter: Payer: Commercial Managed Care - PPO | Admitting: Internal Medicine

## 2011-12-12 ENCOUNTER — Ambulatory Visit (INDEPENDENT_AMBULATORY_CARE_PROVIDER_SITE_OTHER): Payer: Commercial Managed Care - PPO | Admitting: Internal Medicine

## 2011-12-12 ENCOUNTER — Encounter: Payer: Self-pay | Admitting: Internal Medicine

## 2011-12-12 VITALS — BP 123/81 | HR 71 | Ht 67.0 in | Wt 171.0 lb

## 2011-12-12 DIAGNOSIS — Z95810 Presence of automatic (implantable) cardiac defibrillator: Secondary | ICD-10-CM

## 2011-12-12 DIAGNOSIS — Q248 Other specified congenital malformations of heart: Secondary | ICD-10-CM

## 2011-12-12 DIAGNOSIS — E782 Mixed hyperlipidemia: Secondary | ICD-10-CM

## 2011-12-12 DIAGNOSIS — E785 Hyperlipidemia, unspecified: Secondary | ICD-10-CM

## 2011-12-12 DIAGNOSIS — R55 Syncope and collapse: Secondary | ICD-10-CM

## 2011-12-12 LAB — ICD DEVICE OBSERVATION
BATTERY VOLTAGE: 2.57 V
BRDY-0002RV: 40 {beats}/min
CHARGE TIME: 12 s
DEV-0020ICD: NEGATIVE
DEVICE MODEL ICD: 317209
RV LEAD IMPEDENCE ICD: 410 Ohm

## 2011-12-12 MED ORDER — SIMVASTATIN 20 MG PO TABS: 20.0000 mg | ORAL_TABLET | Freq: Every day | ORAL | Status: DC

## 2011-12-12 MED ORDER — OMEPRAZOLE 20 MG PO CPDR: 20.0000 mg | DELAYED_RELEASE_CAPSULE | Freq: Every day | ORAL | Status: DC

## 2011-12-12 NOTE — Addendum Note (Signed)
Addended by: Dennis Bast F on: 12/12/2011 05:35 PM   Modules accepted: Orders

## 2011-12-12 NOTE — Assessment & Plan Note (Signed)
He has had no recurrent syncope since his device was placed.

## 2011-12-12 NOTE — Progress Notes (Signed)
HPI Mr. Casebolt returns today for followup. He is a very pleasant 41 year old man with a history of syncope and EKG consistent with Brugada syndrome, who underwent insertion of a prophylactic defibrillator in 2007. Since then, he has done well. He has had no ICD shock. He denies chest pain or shortness of breath. No syncope. No Known Allergies   Current Outpatient Prescriptions  Medication Sig Dispense Refill  . aspirin 81 MG EC tablet Take 81 mg by mouth daily.        . folic acid (FOLVITE) 1 MG tablet Take 1 tablet (1 mg total) by mouth daily.  90 tablet  11  . omeprazole (PRILOSEC) 20 MG capsule Take 1 capsule (20 mg total) by mouth daily.  90 capsule  3  . simvastatin (ZOCOR) 20 MG tablet Take 1 tablet (20 mg total) by mouth at bedtime.  90 tablet  3  . DISCONTD: omeprazole (PRILOSEC) 20 MG capsule Take 1 capsule (20 mg total) by mouth as needed.  90 capsule  3  . DISCONTD: omeprazole (PRILOSEC) 20 MG capsule Take 20 mg by mouth daily.      Marland Kitchen DISCONTD: simvastatin (ZOCOR) 20 MG tablet Take 1 tablet (20 mg total) by mouth at bedtime.  90 tablet  3     Past Medical History  Diagnosis Date  . Syncope     s/p ICD 12/07  . Brugada syndrome     s/p ICD 12/07  . GERD (gastroesophageal reflux disease)     ROS:   All systems reviewed and negative except as noted in the HPI.   Past Surgical History  Procedure Date  . Cardiac defibrillator placement 02/2006    St. Jude Atlas     No family history on file.   History   Social History  . Marital Status: Married    Spouse Name: N/A    Number of Children: N/A  . Years of Education: N/A   Occupational History  . Not on file.   Social History Main Topics  . Smoking status: Former Games developer  . Smokeless tobacco: Not on file   Comment: Ongoing tobacco abuse  . Alcohol Use: No  . Drug Use: Not on file  . Sexually Active: Not on file   Other Topics Concern  . Not on file   Social History Narrative   Married     BP 123/81   Pulse 71  Ht 5\' 7"  (1.702 m)  Wt 171 lb (77.565 kg)  BMI 26.78 kg/m2  SpO2 100%  Physical Exam:  Well appearing 41 year old man, NAD HEENT: Unremarkable Neck:  No JVD, no thyromegally Lungs:  Clear with no wheezes, rales, or rhonchi. Well-healed ICD incision HEART:  Regular rate rhythm, no murmurs, no rubs, no clicks Abd:  soft, positive bowel sounds, no organomegally, no rebound, no guarding Ext:  2 plus pulses, no edema, no cyanosis, no clubbing Skin:  No rashes no nodules Neuro:  CN II through XII intact, motor grossly intact  DEVICE  Normal device function.  See PaceArt for details.   Assess/Plan:

## 2011-12-12 NOTE — Assessment & Plan Note (Signed)
His device is working normally. He is approaching elective replacement may well be scheduled 2 years before he reaches ERI.

## 2011-12-12 NOTE — Assessment & Plan Note (Signed)
He will continue simvastatin and a low-fat diet.

## 2011-12-19 ENCOUNTER — Ambulatory Visit (INDEPENDENT_AMBULATORY_CARE_PROVIDER_SITE_OTHER): Payer: Commercial Managed Care - PPO | Admitting: Physician Assistant

## 2011-12-19 VITALS — BP 101/75 | HR 81 | Temp 98.7°F | Resp 18 | Wt 172.0 lb

## 2011-12-19 DIAGNOSIS — E785 Hyperlipidemia, unspecified: Secondary | ICD-10-CM

## 2011-12-19 DIAGNOSIS — Z23 Encounter for immunization: Secondary | ICD-10-CM

## 2011-12-19 LAB — POCT CBC
Granulocyte percent: 62.1 %G (ref 37–80)
HCT, POC: 48.1 % (ref 43.5–53.7)
Hemoglobin: 14.9 g/dL (ref 14.1–18.1)
Lymph, poc: 2.9 (ref 0.6–3.4)
MCH, POC: 30.4 pg (ref 27–31.2)
MCHC: 31 g/dL — AB (ref 31.8–35.4)
MCV: 98.1 fL — AB (ref 80–97)
MID (cbc): 0.6 (ref 0–0.9)
MPV: 8.8 fL (ref 0–99.8)
POC Granulocyte: 5.7 (ref 2–6.9)
POC LYMPH PERCENT: 31.8 %L (ref 10–50)
POC MID %: 6.1 %M (ref 0–12)
Platelet Count, POC: 381 10*3/uL (ref 142–424)
RBC: 4.9 M/uL (ref 4.69–6.13)
RDW, POC: 12.6 %
WBC: 9.2 10*3/uL (ref 4.6–10.2)

## 2011-12-19 MED ORDER — FOLIC ACID 1 MG PO TABS: 1.0000 mg | ORAL_TABLET | Freq: Every day | ORAL | Status: DC

## 2011-12-19 MED ORDER — L-METHYLFOLATE-B6-B12 2.8-25-2 MG PO TABS: 1.0000 | ORAL_TABLET | Freq: Every day | ORAL | Status: DC

## 2011-12-19 NOTE — Progress Notes (Signed)
  Subjective:    Patient ID: Chad [name], male    DOB: 10/18/1970, 41 y.o.   MRN: [id number]  HPI 41 year old male presents for hyperlipidemia recheck and flu shot. Admits that he does need a refill on his medications.  No complaints today.  Does admit that he went to see his Cardiologist last week and had some medications filled at that time. Unsure of what was done and which medications he is supposed to be on.  Also requesting refills on the vitamins that he has been on in the past, but not sure of the names. States they are for his heart.     Review of Systems  Respiratory: Negative for cough and shortness of breath.   Cardiovascular: Negative for chest pain.  Gastrointestinal: Negative for abdominal pain.  Neurological: Negative for dizziness.  All other systems reviewed and are negative.       Objective:   Physical Exam  Constitutional: He is oriented to person, place, and time. He appears well-developed and well-nourished.  HENT:  Head: Normocephalic and atraumatic.  Right Ear: External ear normal.  Left Ear: External ear normal.  Eyes: Conjunctivae normal are normal.  Neck: Normal range of motion.  Cardiovascular: Normal rate and normal heart sounds.   Pulmonary/Chest: Effort normal and breath sounds normal.  Neurological: He is alert and oriented to person, place, and time.  Psychiatric: He has a normal mood and affect. His behavior is normal. Judgment and thought content normal.     Results for orders placed in visit on 12/19/11  POCT CBC      Component Value Range   WBC 9.2  4.6 - 10.2 K/uL   Lymph, poc 2.9  0.6 - 3.4   POC LYMPH PERCENT 31.8  10 - 50 %L   MID (cbc) 0.6  0 - 0.9   POC MID % 6.1  0 - 12 %M   POC Granulocyte 5.7  2 - 6.9   Granulocyte percent 62.1  37 - 80 %G   RBC 4.90  4.69 - 6.13 M/uL   Hemoglobin 14.9  14.1 - 18.1 g/dL   HCT, POC 11.9  14.7 - 53.7 %   MCV 98.1 (*) 80 - 97 fL   MCH, POC 30.4  27 - 31.2 pg   MCHC 31.0 (*) 31.8 - 35.4 g/dL   RDW, POC 82.9     Platelet Count, POC 381  142 - 424 K/uL   MPV 8.8  0 - 99.8 fL        Assessment & Plan:   1. Hyperlipidemia  POCT CBC, Comprehensive metabolic panel, Lipid panel  2. Need for influenza vaccination  Flu vaccine greater than or equal to 3yo preservative free IM   Simvastatin 20 mg tablet refilled #90 with 3 refills on 10/1 by his Cardiologist D/C atorvastatin.   Refilled Metanx and Folic acid Labs ordered.  Follow up in 6 months

## 2011-12-20 ENCOUNTER — Other Ambulatory Visit: Payer: Self-pay | Admitting: *Deleted

## 2011-12-20 LAB — COMPREHENSIVE METABOLIC PANEL
ALT: 61 U/L — ABNORMAL HIGH (ref 0–53)
AST: 37 U/L (ref 0–37)
Albumin: 4.7 g/dL (ref 3.5–5.2)
Alkaline Phosphatase: 51 U/L (ref 39–117)
BUN: 17 mg/dL (ref 6–23)
CO2: 29 mEq/L (ref 19–32)
Calcium: 9.9 mg/dL (ref 8.4–10.5)
Chloride: 103 mEq/L (ref 96–112)
Creat: 0.81 mg/dL (ref 0.50–1.35)
Glucose, Bld: 92 mg/dL (ref 70–99)
Potassium: 4.9 mEq/L (ref 3.5–5.3)
Sodium: 139 mEq/L (ref 135–145)
Total Bilirubin: 0.7 mg/dL (ref 0.3–1.2)
Total Protein: 7.5 g/dL (ref 6.0–8.3)

## 2011-12-20 LAB — LIPID PANEL
Cholesterol: 175 mg/dL (ref 0–200)
HDL: 44 mg/dL (ref >39)
LDL Cholesterol: 62 mg/dL (ref 0–99)
Total CHOL/HDL Ratio: 4 Ratio
Triglycerides: 347 mg/dL — ABNORMAL HIGH (ref <150)
VLDL: 69 mg/dL — ABNORMAL HIGH (ref 0–40)

## 2011-12-20 MED ORDER — L-METHYLFOLATE-B6-B12 2.8-25-2 MG PO TABS: 1.0000 | ORAL_TABLET | Freq: Every day | ORAL | Status: DC

## 2011-12-21 ENCOUNTER — Other Ambulatory Visit: Payer: Self-pay | Admitting: *Deleted

## 2011-12-21 ENCOUNTER — Other Ambulatory Visit: Payer: Self-pay | Admitting: Physician Assistant

## 2011-12-21 MED ORDER — NIACIN 250 MG PO TABS: 250.0000 mg | ORAL_TABLET | Freq: Every day | ORAL | Status: DC

## 2011-12-21 MED ORDER — L-METHYLFOLATE-B6-B12 2.8-25-2 MG PO TABS: 1.0000 | ORAL_TABLET | Freq: Every day | ORAL | Status: DC

## 2011-12-22 ENCOUNTER — Other Ambulatory Visit: Payer: Self-pay | Admitting: *Deleted

## 2011-12-22 MED ORDER — L-METHYLFOLATE-B6-B12 2.8-25-2 MG PO TABS: 1.0000 | ORAL_TABLET | Freq: Every day | ORAL | Status: DC

## 2012-03-18 ENCOUNTER — Encounter: Payer: Commercial Managed Care - PPO | Admitting: *Deleted

## 2012-03-18 ENCOUNTER — Telehealth: Payer: Self-pay | Admitting: Internal Medicine

## 2012-03-18 NOTE — Telephone Encounter (Signed)
Called pt re not doing remote today, pt overseas and will be gone a month, will do when he gets back

## 2012-03-20 ENCOUNTER — Encounter: Payer: Self-pay | Admitting: *Deleted

## 2012-06-18 ENCOUNTER — Ambulatory Visit (INDEPENDENT_AMBULATORY_CARE_PROVIDER_SITE_OTHER): Payer: Commercial Managed Care - PPO | Admitting: *Deleted

## 2012-06-18 ENCOUNTER — Other Ambulatory Visit: Payer: Self-pay | Admitting: Internal Medicine

## 2012-06-18 DIAGNOSIS — I428 Other cardiomyopathies: Secondary | ICD-10-CM

## 2012-06-18 DIAGNOSIS — Z95810 Presence of automatic (implantable) cardiac defibrillator: Secondary | ICD-10-CM

## 2012-06-18 LAB — REMOTE ICD DEVICE
BATTERY VOLTAGE: 2.55 V
RV LEAD AMPLITUDE: 11 mv
RV LEAD IMPEDENCE ICD: 430 Ohm
VENTRICULAR PACING ICD: 1 pct

## 2012-07-09 ENCOUNTER — Encounter: Payer: Self-pay | Admitting: *Deleted

## 2012-07-12 ENCOUNTER — Encounter: Payer: Self-pay | Admitting: Internal Medicine

## 2012-07-22 ENCOUNTER — Ambulatory Visit: Payer: Commercial Managed Care - PPO | Admitting: *Deleted

## 2012-07-23 ENCOUNTER — Encounter: Payer: Self-pay | Admitting: Internal Medicine

## 2012-07-24 LAB — REMOTE ICD DEVICE
DEV-0020ICD: NEGATIVE
DEVICE MODEL ICD: 317209
RV LEAD AMPLITUDE: 11.6 mv

## 2012-08-01 ENCOUNTER — Encounter: Payer: Self-pay | Admitting: *Deleted

## 2012-09-01 ENCOUNTER — Ambulatory Visit (INDEPENDENT_AMBULATORY_CARE_PROVIDER_SITE_OTHER): Payer: Commercial Managed Care - PPO | Admitting: Emergency Medicine

## 2012-09-01 VITALS — BP 118/74 | HR 78 | Temp 97.9°F | Resp 18 | Ht 67.0 in | Wt 170.0 lb

## 2012-09-01 DIAGNOSIS — E782 Mixed hyperlipidemia: Secondary | ICD-10-CM

## 2012-09-01 DIAGNOSIS — Q249 Congenital malformation of heart, unspecified: Secondary | ICD-10-CM

## 2012-09-01 DIAGNOSIS — Z79899 Other long term (current) drug therapy: Secondary | ICD-10-CM

## 2012-09-01 DIAGNOSIS — I498 Other specified cardiac arrhythmias: Secondary | ICD-10-CM | POA: Insufficient documentation

## 2012-09-01 DIAGNOSIS — R251 Tremor, unspecified: Secondary | ICD-10-CM

## 2012-09-01 DIAGNOSIS — R259 Unspecified abnormal involuntary movements: Secondary | ICD-10-CM

## 2012-09-01 DIAGNOSIS — E785 Hyperlipidemia, unspecified: Secondary | ICD-10-CM

## 2012-09-01 LAB — LIPID PANEL
HDL: 42 mg/dL (ref >39)
Total CHOL/HDL Ratio: 5 Ratio

## 2012-09-01 LAB — POCT CBC
Granulocyte percent: 64.2 %G (ref 37–80)
HCT, POC: 41.7 % — AB (ref 43.5–53.7)
MCV: 98.2 fL — AB (ref 80–97)
POC LYMPH PERCENT: 30.1 %L (ref 10–50)
RDW, POC: 12.4 %

## 2012-09-01 LAB — POCT UA - MICROSCOPIC ONLY: Yeast, UA: NEGATIVE

## 2012-09-01 LAB — POCT URINALYSIS DIPSTICK
Bilirubin, UA: NEGATIVE
Glucose, UA: NEGATIVE
Leukocytes, UA: NEGATIVE
Nitrite, UA: NEGATIVE

## 2012-09-01 LAB — COMPREHENSIVE METABOLIC PANEL
ALT: 44 U/L (ref 0–53)
AST: 29 U/L (ref 0–37)
Calcium: 9.7 mg/dL (ref 8.4–10.5)
Chloride: 103 mEq/L (ref 96–112)
Creat: 0.94 mg/dL (ref 0.50–1.35)
Total Bilirubin: 0.7 mg/dL (ref 0.3–1.2)

## 2012-09-01 MED ORDER — SIMVASTATIN 20 MG PO TABS: 20.0000 mg | ORAL_TABLET | Freq: Every day | ORAL | Status: DC

## 2012-09-01 NOTE — Progress Notes (Signed)
Subjective:    Patient ID: Chad [name], male    DOB: [birth year], 42 y.o.   MRN: [id number]  HPI Pt states he is here for a check up on his cholesterol. Also wants his sugar checked. He is on simvastatin 20mg . Also states his head has been "shaking". This has been going on 5-6 years now, but is worsening now. He says his mother does not have this. He has a history of heart attack. He has a Armed forces technical officer. He still sees the heart doctor. Has been doing fine since then.    Review of Systems     Objective:   Physical Exam patient is alert and cooperative in no distress. His neck is supple. His chest is clear. Cardiac reveals a regular rate and rhythm. There is a defibrillator in the left anterior chest. Abdomen is soft nontender.  Results for orders placed in visit on 09/01/12  POCT CBC      Result Value Range   WBC 8.9  4.6 - 10.2 K/uL   Lymph, poc 2.7  0.6 - 3.4   POC LYMPH PERCENT 30.1  10 - 50 %L   MID (cbc) 0.5  0 - 0.9   POC MID % 5.7  0 - 12 %M   POC Granulocyte 5.7  2 - 6.9   Granulocyte percent 64.2  37 - 80 %G   RBC 4.25 (*) 4.69 - 6.13 M/uL   Hemoglobin 13.1 (*) 14.1 - 18.1 g/dL   HCT, POC 40.9 (*) 81.1 - 53.7 %   MCV 98.2 (*) 80 - 97 fL   MCH, POC 30.8  27 - 31.2 pg   MCHC 31.4 (*) 31.8 - 35.4 g/dL   RDW, POC 91.4     Platelet Count, POC 261  142 - 424 K/uL   MPV 8.7  0 - 99.8 fL   Results for orders placed in visit on 09/01/12  POCT CBC      Result Value Range   WBC 8.9  4.6 - 10.2 K/uL   Lymph, poc 2.7  0.6 - 3.4   POC LYMPH PERCENT 30.1  10 - 50 %L   MID (cbc) 0.5  0 - 0.9   POC MID % 5.7  0 - 12 %M   POC Granulocyte 5.7  2 - 6.9   Granulocyte percent 64.2  37 - 80 %G   RBC 4.25 (*) 4.69 - 6.13 M/uL   Hemoglobin 13.1 (*) 14.1 - 18.1 g/dL   HCT, POC 78.2 (*) 95.6 - 53.7 %   MCV 98.2 (*) 80 - 97 fL   MCH, POC 30.8  27 - 31.2 pg   MCHC 31.4 (*) 31.8 - 35.4 g/dL   RDW, POC 21.3     Platelet Count, POC 261  142 - 424 K/uL   MPV 8.7  0 - 99.8 fL  POCT UA -  MICROSCOPIC ONLY      Result Value Range   WBC, Ur, HPF, POC 0-2     RBC, urine, microscopic 0-1     Bacteria, U Microscopic trace     Mucus, UA neg     Epithelial cells, urine per micros 1-2     Crystals, Ur, HPF, POC neg     Casts, Ur, LPF, POC neg     Yeast, UA neg    POCT URINALYSIS DIPSTICK      Result Value Range   Color, UA yellow     Clarity, UA clear     Glucose, UA  neg     Bilirubin, UA neg     Ketones, UA neg     Spec Grav, UA 1.025     Blood, UA neg     pH, UA 6.5     Protein, UA neg     Urobilinogen, UA 0.2     Nitrite, UA neg     Leukocytes, UA Negative         Assessment & Plan:  Patient referred to neurology. I refilled his cholesterol medications. Routine labs were done to. He has a healthy lifestyle I and cursed him to continue exercise and

## 2012-09-05 ENCOUNTER — Telehealth: Payer: Self-pay

## 2012-09-05 NOTE — Telephone Encounter (Signed)
Pt states he called yesterday ( i dont see a record of that). States theres something wrong with his cholesterol rx. Language barrier so difficult to understand the issue pt was trying to explain.   [facility] [address]  Please call pt to clarify  bf

## 2012-09-06 MED ORDER — ATORVASTATIN CALCIUM 20 MG PO TABS: 20.0000 mg | ORAL_TABLET | Freq: Every day | ORAL | Status: DC

## 2012-09-06 NOTE — Telephone Encounter (Signed)
zocor 20 mg. Changed to lipitor 20, but lipitor not sent, have sent. [name]

## 2012-09-19 ENCOUNTER — Ambulatory Visit (INDEPENDENT_AMBULATORY_CARE_PROVIDER_SITE_OTHER): Payer: Commercial Managed Care - PPO | Admitting: Neurology

## 2012-09-19 ENCOUNTER — Encounter: Payer: Self-pay | Admitting: Neurology

## 2012-09-19 VITALS — BP 119/90 | HR 63 | Ht 66.5 in | Wt 168.5 lb

## 2012-09-19 DIAGNOSIS — G250 Essential tremor: Secondary | ICD-10-CM

## 2012-09-19 NOTE — Progress Notes (Signed)
[facility]  PATIENT: Chad [name] DOB: 1970/07/23  HISTORICAL  Chad [name] is a 42 years old right-handed [place] male, referred by his primary care physician Dr. Elgie Congo for evaluation of head shaking,  He had past medical history of low back surgery, hyperlipidemia, since 2007, he began to notice head titubation, he had no-no head shaking, there was no significant cane streaking, he noticed his head shaking when he leans backwards,  there was no limitation on his daily function.  He denies gait difficulty, no weakness, there was no significant family history of similar disease    REVIEW OF SYSTEMS: Full 14 system review of systems performed and notable only for memory loss, snoring,   ALLERGIES: No Known Allergies  HOME MEDICATIONS: Outpatient Prescriptions Prior to Visit  Medication Sig Dispense Refill  . atorvastatin (LIPITOR) 20 MG tablet Take 1 tablet (20 mg total) by mouth daily.  90 tablet  3  . folic acid (FOLVITE) 1 MG tablet Take 1 tablet (1 mg total) by mouth daily.  90 tablet  11  . niacin 250 MG tablet Take 1 tablet (250 mg total) by mouth daily with breakfast.  90 tablet  0  . aspirin 81 MG EC tablet Take 81 mg by mouth daily.        . folic acid (FOLVITE) 1 MG tablet Take 1 tablet (1 mg total) by mouth daily.  90 tablet  1  . L-Methylfolate-B6-B12 2.8-25-2 MG TABS Take 1 tablet by mouth daily.  90 each  1  . omeprazole (PRILOSEC) 20 MG capsule Take 1 capsule (20 mg total) by mouth daily.  90 capsule  3   No facility-administered medications prior to visit.    PAST MEDICAL HISTORY: Past Medical History  Diagnosis Date  . Syncope     s/p ICD 12/07  . Brugada syndrome     s/p ICD 12/07  . GERD (gastroesophageal reflux disease)   . Hyperlipidemia     PAST SURGICAL HISTORY: Past Surgical History  Procedure Laterality Date  . Cardiac defibrillator placement  02/2006    St. Jude Atlas    FAMILY HISTORY: Family History  Problem  Relation Age of Onset  . Hyperlipidemia Mother   . Heart disease Mother   . Diabetes Mother     SOCIAL HISTORY:  History   Social History  . Marital Status: Married    Spouse Name: N/A    Number of Children: 2, age 65, 46  . Years of Education: GED   Occupational History  . Nail technician    Social History Main Topics  . Smoking status: Former Games developer  . Smokeless tobacco: Not on file     Comment: Ongoing tobacco abuse,quit smoking 2013  . Alcohol Use: Yes     Comment: 12 weekly  . Drug Use: No  . Sexually Active: Not on file    PHYSICAL EXAM  Filed Vitals:   09/19/12 0917  BP: 119/90  Pulse: 63  Height: 5' 6.5" (1.689 m)  Weight: 168 lb 8 oz (76.431 kg)   Body mass index is 26.79 kg/(m^2).   Generalized: In no acute distress  Neck: Supple, no carotid bruits   Cardiac: Regular rate rhythm  Pulmonary: Clear to auscultation bilaterally  Musculoskeletal: No deformity  Neurological examination  Mentation: Alert oriented to time, place, history taking, and causual conversation  Cranial nerve II-XII: Pupils were equal round reactive to light extraocular movements were full, visual field were full on confrontational test. facial  sensation and strength were normal. hearing was intact to finger rubbing bilaterally. Uvula tongue midline.  head turning and shoulder shrug and were normal and symmetric.Tongue protrusion into cheek strength was normal.  Motor: normal tone, bulk and strength.  Sensory: Intact to fine touch, pinprick, preserved vibratory sensation, and proprioception at toes.  Coordination: Normal finger to nose, heel-to-shin bilaterally there was no truncal ataxia  Gait: Rising up from seated position without assistance, normal stance, without trunk ataxia, moderate stride, good arm swing, smooth turning, able to perform tiptoe, and heel walking without difficulty.   Romberg signs: Negative  Deep tendon reflexes: Brachioradialis 2/2, biceps 2/2,  triceps 2/2, patellar 2/2, Achilles 2/2, plantar responses were flexor bilaterally.   DIAGNOSTIC DATA (LABS, IMAGING, TESTING) - I reviewed patient records, labs, notes, testing and imaging myself where available.  Lab Results  Component Value Date   WBC 8.9 09/01/2012   HGB 13.1* 09/01/2012   HCT 41.7* 09/01/2012   MCV 98.2* 09/01/2012   PLT 288 04/04/2006      Component Value Date/Time   NA 137 09/01/2012 0816   K 4.0 09/01/2012 0816   CL 103 09/01/2012 0816   CO2 29 09/01/2012 0816   GLUCOSE 95 09/01/2012 0816   BUN 16 09/01/2012 0816   CREATININE 0.94 09/01/2012 0816   CALCIUM 9.7 09/01/2012 0816   PROT 6.9 09/01/2012 0816   ALBUMIN 4.3 09/01/2012 0816   AST 29 09/01/2012 0816   ALT 44 09/01/2012 0816   ALKPHOS 45 09/01/2012 0816   BILITOT 0.7 09/01/2012 0816   Lab Results  Component Value Date   CHOL 212* 09/01/2012   HDL 42 09/01/2012   LDLCALC Comment:   Not calculated due to Triglyceride >400. Suggest ordering Direct LDL (Unit Code: 09811).   Total Cholesterol/HDL Ratio:CHD Risk                        Coronary Heart Disease Risk Table                                        Men       Women          1/2 Average Risk              3.4        3.3              Average Risk              5.0        4.4           2X Average Risk              9.6        7.1           3X Average Risk             23.4       11.0 Use the calculated Patient Ratio above and the CHD Risk table  to determine the patient's CHD Risk. ATP III Classification (LDL):       < 100        mg/dL         Optimal      914 - 129     mg/dL         Near or Above Optimal      130 -  159     mg/dL         Borderline High      160 - 189     mg/dL         High       > 161        mg/dL         Very High   0/96/0454   TRIG 412* 09/01/2012   CHOLHDL 5.0 09/01/2012    Lab Results  Component Value Date   TSH 1.894 09/01/2012    ASSESSMENT AND PLAN 42 years old Caucasian male, with occasionally head titubation, consistent with essential tremor,  return to clinic as needed,   Levert Feinstein, M.D. Ph.D.  [facility] [address], [address] [place], [place] [address] [phone]

## 2012-09-23 ENCOUNTER — Encounter: Payer: Commercial Managed Care - PPO | Admitting: *Deleted

## 2012-09-23 ENCOUNTER — Encounter: Payer: Self-pay | Admitting: Neurology

## 2012-09-24 ENCOUNTER — Encounter: Payer: Self-pay | Admitting: *Deleted

## 2012-10-01 ENCOUNTER — Ambulatory Visit (INDEPENDENT_AMBULATORY_CARE_PROVIDER_SITE_OTHER): Payer: Commercial Managed Care - PPO | Admitting: *Deleted

## 2012-10-01 DIAGNOSIS — I428 Other cardiomyopathies: Secondary | ICD-10-CM

## 2012-10-03 LAB — REMOTE ICD DEVICE
BATTERY VOLTAGE: 2.55 V
BRDY-0002RV: 40 {beats}/min
DEVICE MODEL ICD: 317209
RV LEAD IMPEDENCE ICD: 450 Ohm
VENTRICULAR PACING ICD: 1 pct

## 2012-10-14 NOTE — Addendum Note (Signed)
Addended by: Marily Lente on: 10/14/2012 08:37 AM   Modules accepted: Level of Service

## 2012-11-01 ENCOUNTER — Encounter: Payer: Self-pay | Admitting: *Deleted

## 2012-11-04 ENCOUNTER — Encounter: Payer: Commercial Managed Care - PPO | Admitting: *Deleted

## 2012-11-06 ENCOUNTER — Encounter: Payer: Self-pay | Admitting: *Deleted

## 2012-11-14 ENCOUNTER — Encounter: Payer: Commercial Managed Care - PPO | Admitting: *Deleted

## 2012-11-14 LAB — REMOTE ICD DEVICE
BATTERY VOLTAGE: 2.55 V
BRDY-0002RV: 40 {beats}/min
DEV-0020ICD: NEGATIVE
DEVICE MODEL ICD: 317209

## 2012-12-04 ENCOUNTER — Encounter: Payer: Self-pay | Admitting: *Deleted

## 2012-12-13 ENCOUNTER — Encounter: Payer: Self-pay | Admitting: Internal Medicine

## 2012-12-16 ENCOUNTER — Encounter: Payer: Self-pay | Admitting: Internal Medicine

## 2012-12-23 ENCOUNTER — Ambulatory Visit (INDEPENDENT_AMBULATORY_CARE_PROVIDER_SITE_OTHER): Payer: Commercial Managed Care - PPO | Admitting: Physician Assistant

## 2012-12-23 VITALS — BP 122/74 | HR 62 | Temp 98.0°F | Resp 18 | Ht 67.5 in | Wt 163.0 lb

## 2012-12-23 DIAGNOSIS — E781 Pure hyperglyceridemia: Secondary | ICD-10-CM

## 2012-12-23 DIAGNOSIS — E785 Hyperlipidemia, unspecified: Secondary | ICD-10-CM

## 2012-12-23 LAB — POCT CBC
Granulocyte percent: 60.8 %G (ref 37–80)
MCV: 98.5 fL — AB (ref 80–97)
MID (cbc): 0.4 (ref 0–0.9)
POC Granulocyte: 4.1 (ref 2–6.9)
POC LYMPH PERCENT: 33 %L (ref 10–50)
Platelet Count, POC: 299 10*3/uL (ref 142–424)
RDW, POC: 12.5 %

## 2012-12-23 LAB — LIPID PANEL
Cholesterol: 171 mg/dL (ref 0–200)
LDL Cholesterol: 77 mg/dL (ref 0–99)
Triglycerides: 175 mg/dL — ABNORMAL HIGH (ref <150)

## 2012-12-23 NOTE — Progress Notes (Signed)
  Subjective:    Patient ID: Chad [name], male    DOB: 12-10-1970, 43 y.o.   MRN: [id number]  HPI   Chad [name] is a pleasant 42 yr old male here for recheck of cholesterol.  Last lipid panel in June 2014 with elevated trigs, unable to calculated LDL.  Pt was taking atorvastatin 20mg , but has run out in the last two days.  Taking left over simvastatin instead.  Pt rx'd niacin at some point, but unclear if he is actually taking it.  Reports that he eats lots of steamed rice, chicken, vegetables.  Occ fast food -fried chicken.  Has been exercising for 2-3 months now, works out at home, lifting weights, 3-4x/wk.  He takes several otc supplements, one of which is fish oil.    Of note, pt with ICD in 2006 for brugada.  Follow with Dr. Ladona Ridgel   Review of Systems  Constitutional: Negative.   HENT: Negative.   Respiratory: Negative.   Cardiovascular: Negative.   Gastrointestinal: Negative.   Musculoskeletal: Negative.   Skin: Negative.   Neurological: Negative.        Objective:   Physical Exam  Vitals reviewed. Constitutional: He is oriented to person, place, and time. He appears well-developed and well-nourished. No distress.  HENT:  Head: Normocephalic and atraumatic.  Eyes: Conjunctivae are normal. No scleral icterus.  Cardiovascular: Normal rate, regular rhythm and normal heart sounds.   Pulmonary/Chest: Effort normal and breath sounds normal. He has no wheezes. He has no rales.  Neurological: He is alert and oriented to person, place, and time.  Skin: Skin is warm and dry.  Psychiatric: He has a normal mood and affect. His behavior is normal.        Assessment & Plan:  Hyperlipidemia - Plan: POCT CBC, Lipid panel, LDL Cholesterol, Direct  Hypertriglyceridemia - Plan: POCT CBC, Lipid panel, LDL Cholesterol, Direct   Chad [name] is a very pleasant 42 yr old male here for follow up on cholesterol, triglycerides.  Fasting labs drawn today.  Will follow up on results and send meds at that  time.  Suspect we may need to change his current regimen.  Discussed dietary choices.  Encouraged him to continue exercising.  Will contact pt tomorrow about labs and plan for him to follow up in about 3 months

## 2012-12-23 NOTE — Patient Instructions (Signed)
I will let you know when your labs are back and will refill your medicines at that time.  We may need to add a medicine or increase the dose to get your triglycerides under control.  Continue exercising as much as possible.  Limit alcohol.  Limit carbohydrates and foods that contain high fructose corn syrup.  Make sure your are eating plenty of vegetables, whole grains, lean meats, and fish.    Continue taking the two pill supplements.  Do NOT use the powder supplement.   Hypertriglyceridemia  Diet for High blood levels of Triglycerides Most fats in food are triglycerides. Triglycerides in your blood are stored as fat in your body. High levels of triglycerides in your blood may put you at a greater risk for heart disease and stroke.  Normal triglyceride levels are less than 150 mg/dL. Borderline high levels are 150-199 mg/dl. High levels are 200 - 499 mg/dL, and very high triglyceride levels are greater than 500 mg/dL. The decision to treat high triglycerides is generally based on the level. For people with borderline or high triglyceride levels, treatment includes weight loss and exercise. Drugs are recommended for people with very high triglyceride levels. Many people who need treatment for high triglyceride levels have metabolic syndrome. This syndrome is a collection of disorders that often include: insulin resistance, high blood pressure, blood clotting problems, high cholesterol and triglycerides. TESTING PROCEDURE FOR TRIGLYCERIDES  You should not eat 4 hours before getting your triglycerides measured. The normal range of triglycerides is between 10 and 250 milligrams per deciliter (mg/dl). Some people may have extreme levels (1000 or above), but your triglyceride level may be too high if it is above 150 mg/dl, depending on what other risk factors you have for heart disease.  People with high blood triglycerides may also have high blood cholesterol levels. If you have high blood cholesterol as  well as high blood triglycerides, your risk for heart disease is probably greater than if you only had high triglycerides. High blood cholesterol is one of the main risk factors for heart disease. CHANGING YOUR DIET  Your weight can affect your blood triglyceride level. If you are more than 20% above your ideal body weight, you may be able to lower your blood triglycerides by losing weight. Eating less and exercising regularly is the best way to combat this. Fat provides more calories than any other food. The best way to lose weight is to eat less fat. Only 30% of your total calories should come from fat. Less than 7% of your diet should come from saturated fat. A diet low in fat and saturated fat is the same as a diet to decrease blood cholesterol. By eating a diet lower in fat, you may lose weight, lower your blood cholesterol, and lower your blood triglyceride level.  Eating a diet low in fat, especially saturated fat, may also help you lower your blood triglyceride level. Ask your dietitian to help you figure how much fat you can eat based on the number of calories your caregiver has prescribed for you.  Exercise, in addition to helping with weight loss may also help lower triglyceride levels.   Alcohol can increase blood triglycerides. You may need to stop drinking alcoholic beverages.  Too much carbohydrate in your diet may also increase your blood triglycerides. Some complex carbohydrates are necessary in your diet. These may include bread, rice, potatoes, other starchy vegetables and cereals.  Reduce "simple" carbohydrates. These may include pure sugars, candy, honey, and jelly  without losing other nutrients. If you have the kind of high blood triglycerides that is affected by the amount of carbohydrates in your diet, you will need to eat less sugar and less high-sugar foods. Your caregiver can help you with this.  Adding 2-4 grams of fish oil (EPA+ DHA) may also help lower triglycerides. Speak  with your caregiver before adding any supplements to your regimen. Following the Diet  Maintain your ideal weight. Your caregivers can help you with a diet. Generally, eating less food and getting more exercise will help you lose weight. Joining a weight control group may also help. Ask your caregivers for a good weight control group in your area.  Eat low-fat foods instead of high-fat foods. This can help you lose weight too.  These foods are lower in fat. Eat MORE of these:   Dried beans, peas, and lentils.  Egg whites.  Low-fat cottage cheese.  Fish.  Lean cuts of meat, such as round, sirloin, rump, and flank (cut extra fat off meat you fix).  Whole grain breads, cereals and pasta.  Skim and nonfat dry milk.  Low-fat yogurt.  Poultry without the skin.  Cheese made with skim or part-skim milk, such as mozzarella, parmesan, farmers', ricotta, or pot cheese. These are higher fat foods. Eat LESS of these:   Whole milk and foods made from whole milk, such as American, blue, cheddar, monterey jack, and swiss cheese  High-fat meats, such as luncheon meats, sausages, knockwurst, bratwurst, hot dogs, ribs, corned beef, ground pork, and regular ground beef.  Fried foods. Limit saturated fats in your diet. Substituting unsaturated fat for saturated fat may decrease your blood triglyceride level. You will need to read package labels to know which products contain saturated fats.  These foods are high in saturated fat. Eat LESS of these:   Fried pork skins.  Whole milk.  Skin and fat from poultry.  Palm oil.  Butter.  Shortening.  Cream cheese.  Tomasa Blase.  Margarines and baked goods made from listed oils.  Vegetable shortenings.  Chitterlings.  Fat from meats.  Coconut oil.  Palm kernel oil.  Lard.  Cream.  Sour cream.  Fatback.  Coffee whiteners and non-dairy creamers made with these oils.  Cheese made from whole milk. Use unsaturated fats (both  polyunsaturated and monounsaturated) moderately. Remember, even though unsaturated fats are better than saturated fats; you still want a diet low in total fat.  These foods are high in unsaturated fat:   Canola oil.  Sunflower oil.  Mayonnaise.  Almonds.  Peanuts.  Pine nuts.  Margarines made with these oils.  Safflower oil.  Olive oil.  Avocados.  Cashews.  Peanut butter.  Sunflower seeds.  Soybean oil.  Peanut oil.  Olives.  Pecans.  Walnuts.  Pumpkin seeds. Avoid sugar and other high-sugar foods. This will decrease carbohydrates without decreasing other nutrients. Sugar in your food goes rapidly to your blood. When there is excess sugar in your blood, your liver may use it to make more triglycerides. Sugar also contains calories without other important nutrients.  Eat LESS of these:   Sugar, brown sugar, powdered sugar, jam, jelly, preserves, honey, syrup, molasses, pies, candy, cakes, cookies, frosting, pastries, colas, soft drinks, punches, fruit drinks, and regular gelatin.  Avoid alcohol. Alcohol, even more than sugar, may increase blood triglycerides. In addition, alcohol is high in calories and low in nutrients. Ask for sparkling water, or a diet soft drink instead of an alcoholic beverage. Suggestions for planning and  preparing meals   Bake, broil, grill or roast meats instead of frying.  Remove fat from meats and skin from poultry before cooking.  Add spices, herbs, lemon juice or vinegar to vegetables instead of salt, rich sauces or gravies.  Use a non-stick skillet without fat or use no-stick sprays.  Cool and refrigerate stews and broth. Then remove the hardened fat floating on the surface before serving.  Refrigerate meat drippings and skim off fat to make low-fat gravies.  Serve more fish.  Use less butter, margarine and other high-fat spreads on bread or vegetables.  Use skim or reconstituted non-fat dry milk for cooking.  Cook with  low-fat cheeses.  Substitute low-fat yogurt or cottage cheese for all or part of the sour cream in recipes for sauces, dips or congealed salads.  Use half yogurt/half mayonnaise in salad recipes.  Substitute evaporated skim milk for cream. Evaporated skim milk or reconstituted non-fat dry milk can be whipped and substituted for whipped cream in certain recipes.  Choose fresh fruits for dessert instead of high-fat foods such as pies or cakes. Fruits are naturally low in fat. When Dining Out   Order low-fat appetizers such as fruit or vegetable juice, pasta with vegetables or tomato sauce.  Select clear, rather than cream soups.  Ask that dressings and gravies be served on the side. Then use less of them.  Order foods that are baked, broiled, poached, steamed, stir-fried, or roasted.  Ask for margarine instead of butter, and use only a small amount.  Drink sparkling water, unsweetened tea or coffee, or diet soft drinks instead of alcohol or other sweet beverages. QUESTIONS AND ANSWERS ABOUT OTHER FATS IN THE BLOOD: SATURATED FAT, TRANS FAT, AND CHOLESTEROL What is trans fat? Trans fat is a type of fat that is formed when vegetable oil is hardened through a process called hydrogenation. This process helps makes foods more solid, gives them shape, and prolongs their shelf life. Trans fats are also called hydrogenated or partially hydrogenated oils.  What do saturated fat, trans fat, and cholesterol in foods have to do with heart disease? Saturated fat, trans fat, and cholesterol in the diet all raise the level of LDL "bad" cholesterol in the blood. The higher the LDL cholesterol, the greater the risk for coronary heart disease (CHD). Saturated fat and trans fat raise LDL similarly.  What foods contain saturated fat, trans fat, and cholesterol? High amounts of saturated fat are found in animal products, such as fatty cuts of meat, chicken skin, and full-fat dairy products like butter, whole  milk, cream, and cheese, and in tropical vegetable oils such as palm, palm kernel, and coconut oil. Trans fat is found in some of the same foods as saturated fat, such as vegetable shortening, some margarines (especially hard or stick margarine), crackers, cookies, baked goods, fried foods, salad dressings, and other processed foods made with partially hydrogenated vegetable oils. Small amounts of trans fat also occur naturally in some animal products, such as milk products, beef, and lamb. Foods high in cholesterol include liver, other organ meats, egg yolks, shrimp, and full-fat dairy products. How can I use the new food label to make heart-healthy food choices? Check the Nutrition Facts panel of the food label. Choose foods lower in saturated fat, trans fat, and cholesterol. For saturated fat and cholesterol, you can also use the Percent Daily Value (%DV): 5% DV or less is low, and 20% DV or more is high. (There is no %DV for trans fat.) Use  the Nutrition Facts panel to choose foods low in saturated fat and cholesterol, and if the trans fat is not listed, read the ingredients and limit products that list shortening or hydrogenated or partially hydrogenated vegetable oil, which tend to be high in trans fat. POINTS TO REMEMBER:   Discuss your risk for heart disease with your caregivers, and take steps to reduce risk factors.  Change your diet. Choose foods that are low in saturated fat, trans fat, and cholesterol.  Add exercise to your daily routine if it is not already being done. Participate in physical activity of moderate intensity, like brisk walking, for at least 30 minutes on most, and preferably all days of the week. No time? Break the 30 minutes into three, 10-minute segments during the day.  Stop smoking. If you do smoke, contact your caregiver to discuss ways in which they can help you quit.  Do not use street drugs.  Maintain a normal weight.  Maintain a healthy blood pressure.  Keep  up with your blood work for checking the fats in your blood as directed by your caregiver. Document Released: 12/16/2003 Document Revised: 08/29/2011 Document Reviewed: 07/13/2008 [facility] Patient Information 2014 [place] [facility] [place], [place].

## 2012-12-24 ENCOUNTER — Other Ambulatory Visit: Payer: Self-pay | Admitting: Physician Assistant

## 2012-12-24 MED ORDER — ATORVASTATIN CALCIUM 20 MG PO TABS: 20.0000 mg | ORAL_TABLET | Freq: Every day | ORAL | Status: DC

## 2012-12-27 ENCOUNTER — Encounter: Payer: Self-pay | Admitting: Internal Medicine

## 2012-12-27 ENCOUNTER — Ambulatory Visit (INDEPENDENT_AMBULATORY_CARE_PROVIDER_SITE_OTHER): Payer: Commercial Managed Care - PPO | Admitting: Internal Medicine

## 2012-12-27 VITALS — BP 109/70 | HR 55 | Ht 67.5 in | Wt 163.4 lb

## 2012-12-27 DIAGNOSIS — I498 Other specified cardiac arrhythmias: Secondary | ICD-10-CM

## 2012-12-27 DIAGNOSIS — Q248 Other specified congenital malformations of heart: Secondary | ICD-10-CM

## 2012-12-27 DIAGNOSIS — R55 Syncope and collapse: Secondary | ICD-10-CM

## 2012-12-27 DIAGNOSIS — Z95810 Presence of automatic (implantable) cardiac defibrillator: Secondary | ICD-10-CM

## 2012-12-27 LAB — ICD DEVICE OBSERVATION
BRDY-0002RV: 40 {beats}/min
CHARGE TIME: 12.3 s
DEVICE MODEL ICD: 317209
RV LEAD AMPLITUDE: 9.6 mv
RV LEAD THRESHOLD: 3.5 V
VENTRICULAR PACING ICD: 1 pct

## 2012-12-27 MED ORDER — FOLIC ACID 1 MG PO TABS: 1.0000 mg | ORAL_TABLET | Freq: Every day | ORAL | Status: DC

## 2012-12-27 NOTE — Assessment & Plan Note (Signed)
His device is still not at elective replacement. We discussed the indications for placement new device versus removing the device and removed and the leads. He will reflect on all the above. I suspect she will not have a new device placed. He is considering having his lead removed as well.

## 2012-12-27 NOTE — Patient Instructions (Signed)
Your physician wants you to follow-up in: 12 months with Dr Court Joy will receive a reminder letter in the mail two months in advance. If you don't receive a letter, please call our office to schedule the follow-up appointment.   Remote monitoring is used to monitor your Pacemaker orICD from home. This monitoring reduces the number of office visits required to check your device to one time per year. It allows [place] to keep an eye on the functioning of your device to ensure it is working properly. You are scheduled for a device check from home on 03/31/13. You may send your transmission at any time that day. If you have a wireless device, the transmission will be sent automatically. After your physician reviews your transmission, you will receive a postcard with your next transmission date.

## 2012-12-27 NOTE — Progress Notes (Signed)
HPI Mr. Domeier returns today for followup. He is a very pleasant 42 year-old man with a history of syncope and EKG consistent with Brugada syndrome, who underwent insertion of a prophylactic defibrillator in 2007. Since then, he has done well. He has had no ICD shock. He denies chest pain or shortness of breath. No syncope. He appears to be over a year out from the need for ICD generator change. We discussed whether or not this would be necessary as he is gone 7 years with no ICD therapies, and no ventricular arrhythmias. While he has previously had an electrocardiogram consistent with Brugada syndrome, this diagnosis is in doubt.  No Known Allergies   Current Outpatient Prescriptions  Medication Sig Dispense Refill  . atorvastatin (LIPITOR) 20 MG tablet Take 1 tablet (20 mg total) by mouth daily.  90 tablet  1  . folic acid (FOLVITE) 1 MG tablet Take 1 tablet (1 mg total) by mouth daily.  90 tablet  3  . omeprazole (PRILOSEC) 20 MG capsule Take 20 mg by mouth daily. As needed      . OVER THE COUNTER MEDICATION gnc mega men joint      . OVER THE COUNTER MEDICATION gnc amp men powered       No current facility-administered medications for this visit.     Past Medical History  Diagnosis Date  . Syncope     s/p ICD 12/07  . Brugada syndrome     s/p ICD 12/07  . GERD (gastroesophageal reflux disease)   . Hyperlipidemia     ROS:   All systems reviewed and negative except as noted in the HPI.   Past Surgical History  Procedure Laterality Date  . Cardiac defibrillator placement  02/2006    St. Jude Atlas  . Back surgery  1997     Family History  Problem Relation Age of Onset  . Hyperlipidemia Mother   . Heart disease Mother   . Diabetes Mother      History   Social History  . Marital Status: Married    Spouse Name: N/A    Number of Children: 2  . Years of Education: GED   Occupational History  .      [employer]   Social History Main Topics  . Smoking status: Former  Games developer  . Smokeless tobacco: Not on file     Comment: Ongoing tobacco abuse,quit smoking 2013  . Alcohol Use: Yes     Comment: 12 weekly  . Drug Use: No  . Sexual Activity: Not on file   Other Topics Concern  . Not on file   Social History Narrative   Married     BP 109/70  Pulse 55  Ht 5' 7.5" (1.715 m)  Wt 163 lb 6.4 oz (74.118 kg)  BMI 25.2 kg/m2  Physical Exam:  Well appearing 42 year old man, NAD HEENT: Unremarkable Neck:  No JVD, no thyromegally Lungs:  Clear with no wheezes, rales, or rhonchi. Well-healed ICD incision HEART:  Regular rate rhythm, no murmurs, no rubs, no clicks Abd:  soft, positive bowel sounds, no organomegally, no rebound, no guarding Ext:  2 plus pulses, no edema, no cyanosis, no clubbing Skin:  No rashes no nodules Neuro:  CN II through XII intact, motor grossly intact  DEVICE  Normal device function.  See PaceArt for details.   Assess/Plan:

## 2012-12-27 NOTE — Assessment & Plan Note (Signed)
He has had no recurrent syncope in 7 years. He will undergo watchful waiting.

## 2013-01-07 ENCOUNTER — Encounter: Payer: Self-pay | Admitting: Internal Medicine

## 2013-03-23 ENCOUNTER — Ambulatory Visit (INDEPENDENT_AMBULATORY_CARE_PROVIDER_SITE_OTHER): Payer: Commercial Managed Care - PPO | Admitting: *Deleted

## 2013-03-23 DIAGNOSIS — Z23 Encounter for immunization: Secondary | ICD-10-CM

## 2013-03-31 ENCOUNTER — Encounter: Payer: Commercial Managed Care - PPO | Admitting: *Deleted

## 2013-04-08 ENCOUNTER — Encounter: Payer: Self-pay | Admitting: *Deleted

## 2013-06-17 ENCOUNTER — Encounter: Payer: Self-pay | Admitting: *Deleted

## 2013-06-23 ENCOUNTER — Other Ambulatory Visit: Payer: Self-pay | Admitting: Physician Assistant

## 2013-08-12 ENCOUNTER — Encounter: Payer: Self-pay | Admitting: Cardiology

## 2013-09-23 ENCOUNTER — Ambulatory Visit (INDEPENDENT_AMBULATORY_CARE_PROVIDER_SITE_OTHER): Payer: No Typology Code available for payment source | Admitting: Family Medicine

## 2013-09-23 VITALS — BP 118/90 | HR 63 | Temp 97.7°F | Resp 18 | Ht 67.5 in | Wt 162.0 lb

## 2013-09-23 DIAGNOSIS — Q248 Other specified congenital malformations of heart: Secondary | ICD-10-CM

## 2013-09-23 DIAGNOSIS — I498 Other specified cardiac arrhythmias: Secondary | ICD-10-CM

## 2013-09-23 DIAGNOSIS — E785 Hyperlipidemia, unspecified: Secondary | ICD-10-CM

## 2013-09-23 DIAGNOSIS — R259 Unspecified abnormal involuntary movements: Secondary | ICD-10-CM

## 2013-09-23 DIAGNOSIS — R251 Tremor, unspecified: Secondary | ICD-10-CM

## 2013-09-23 DIAGNOSIS — Z0000 Encounter for general adult medical examination without abnormal findings: Secondary | ICD-10-CM

## 2013-09-23 LAB — POCT URINALYSIS DIPSTICK
Bilirubin, UA: NEGATIVE
Glucose, UA: NEGATIVE
Ketones, UA: NEGATIVE
Leukocytes, UA: NEGATIVE
Nitrite, UA: NEGATIVE
Protein, UA: NEGATIVE
Spec Grav, UA: 1.02
Urobilinogen, UA: 0.2
pH, UA: 5.5

## 2013-09-23 LAB — COMPLETE METABOLIC PANEL WITHOUT GFR
ALT: 65 U/L — ABNORMAL HIGH (ref 0–53)
Alkaline Phosphatase: 49 U/L (ref 39–117)
Creat: 0.86 mg/dL (ref 0.50–1.35)
GFR, Est Non African American: >89 mL/min
Total Bilirubin: 1 mg/dL (ref 0.2–1.2)
Total Protein: 7.6 g/dL (ref 6.0–8.3)

## 2013-09-23 LAB — LIPID PANEL
Cholesterol: 193 mg/dL (ref 0–200)
HDL: 70 mg/dL (ref >39)
LDL Cholesterol: 82 mg/dL (ref 0–99)
Total CHOL/HDL Ratio: 2.8 ratio
Triglycerides: 207 mg/dL — ABNORMAL HIGH (ref <150)
VLDL: 41 mg/dL — ABNORMAL HIGH (ref 0–40)

## 2013-09-23 LAB — CBC
HCT: 45.1 % (ref 39.0–52.0)
Hemoglobin: 15.6 g/dL (ref 13.0–17.0)
MCH: 32.2 pg (ref 26.0–34.0)
MCHC: 34.6 g/dL (ref 30.0–36.0)
MCV: 93 fL (ref 78.0–100.0)
Platelets: 255 10*3/uL (ref 150–400)
RBC: 4.85 MIL/uL (ref 4.22–5.81)
RDW: 13.2 % (ref 11.5–15.5)
WBC: 7.7 10*3/uL (ref 4.0–10.5)

## 2013-09-23 LAB — COMPLETE METABOLIC PANEL WITH GFR
AST: 46 U/L — ABNORMAL HIGH (ref 0–37)
Albumin: 4.9 g/dL (ref 3.5–5.2)
BUN: 12 mg/dL (ref 6–23)
CO2: 30 mEq/L (ref 19–32)
Calcium: 9.7 mg/dL (ref 8.4–10.5)
Chloride: 100 mEq/L (ref 96–112)
GFR, Est African American: >89 mL/min
Glucose, Bld: 100 mg/dL — ABNORMAL HIGH (ref 70–99)
Potassium: 4.2 mEq/L (ref 3.5–5.3)
Sodium: 140 mEq/L (ref 135–145)

## 2013-09-23 LAB — TSH: TSH: 0.938 u[IU]/mL (ref 0.350–4.500)

## 2013-09-23 MED ORDER — OMEPRAZOLE 20 MG PO CPDR: 20.0000 mg | DELAYED_RELEASE_CAPSULE | Freq: Every day | ORAL | Status: DC

## 2013-09-23 MED ORDER — ATORVASTATIN CALCIUM 20 MG PO TABS: 20.0000 mg | ORAL_TABLET | Freq: Every day | ORAL | Status: DC

## 2013-09-23 NOTE — Progress Notes (Signed)
Chief Complaint:  Chief Complaint  Patient presents with  . Annual Exam    HPI: Chad [name] is a 43 y.o. male who is here for annual exam. He has a h/o hyperlipidemia, h. Brugada syndrom on ekg and underwent a defribrillator placement in 2007, and also has intermittent tremors of his head which he has been seen by neurology for one time.  He has had a history of twtiching of his head , tremors in his neck/head and has increased in the last year, he feels it most when his head is still ie when he is talking to someone or when he is watchin TV or sleeping.  No shakes of his hands or feet or a family history of essential tremors. HE was evaluated by [facility] neurology Dr Chad [name] and would like a second opinion.  Please Dr Zannie Cove in epic from 09/2012, she attributed his tremors to essential tremors and told him to follw-up prn.   No prior neck injuries. No family hisotry of Parkinsons or MS.  No HA or dizziness Would like blood work for annual exam. HE ahs not eaten.   Last OV from  Dr Ladona Ridgel, cardiology was in 12/2012:  Chad [name] returns today for followup. He is a very pleasant 43 year-old man with a history of syncope and EKG consistent with Brugada syndrome, who underwent insertion of a prophylactic defibrillator in 2007. Since then, he has done well. He has had no ICD shock. He denies chest pain or shortness of breath. No syncope. He appears to be over a year out from the need for ICD generator change. We discussed whether or not this would be necessary as he is gone 7 years with no ICD therapies, and no ventricular arrhythmias. While he has previously had an electrocardiogram consistent with Brugada syndrome, this diagnosis is in doubt.  No Known Allergies   Past Medical History  Diagnosis Date  . Syncope     s/p ICD 12/07  . Brugada syndrome     s/p ICD 12/07  . GERD (gastroesophageal reflux disease)   . Hyperlipidemia    Past Surgical History  Procedure Laterality Date  . Cardiac  defibrillator placement  02/2006    St. Jude Atlas  . Back surgery  1997   History   Social History  . Marital Status: Married    Spouse Name: N/A    Number of Children: 2  . Years of Education: GED   Occupational History  .      [employer]   Social History Main Topics  . Smoking status: Former Games developer  . Smokeless tobacco: None     Comment: Ongoing tobacco abuse,quit smoking 2013  . Alcohol Use: Yes     Comment: 12 weekly  . Drug Use: No  . Sexual Activity: None   Other Topics Concern  . None   Social History Narrative   Married   Family History  Problem Relation Age of Onset  . Hyperlipidemia Mother   . Heart disease Mother   . Diabetes Mother    No Known Allergies Prior to Admission medications   Medication Sig Start Date End Date Taking? Authorizing Provider  atorvastatin (LIPITOR) 20 MG tablet Take 1 tablet (20 mg total) by mouth daily. 12/24/12  Yes Godfrey Pick, PA-C  folic acid (FOLVITE) 1 MG tablet Take 1 tablet (1 mg total) by mouth daily. 12/27/12  Yes Marinus Maw, MD  omeprazole (PRILOSEC) 20 MG capsule Take 20 mg  by mouth daily. As needed   Yes Historical Provider, MD     ROS: The patient denies fevers, chills, night sweats, unintentional weight loss, chest pain, palpitations, wheezing, dyspnea on exertion, nausea, vomiting, abdominal pain, dysuria, hematuria, melena, numbness, weakness, or tingling. + head tremors  All other systems have been reviewed and were otherwise negative with the exception of those mentioned in the HPI and as above.    PHYSICAL EXAM: Filed Vitals:   09/23/13 1001  BP: 118/90  Pulse: 63  Temp: 97.7 F (36.5 C)  Resp: 18   Filed Vitals:   09/23/13 1001  Height: 5' 7.5" (1.715 m)  Weight: 162 lb (73.483 kg)   Body mass index is 24.98 kg/(m^2).  General: Alert, no acute distress HEENT:  Normocephalic, atraumatic, oropharynx patent. EOMI, PERRLA, TM nl, fundo exam normal Cardiovascular:  Regular rate and rhythm,  no rubs murmurs or gallops.  No Carotid bruits, radial pulse intact. No pedal edema.  Respiratory: Clear to auscultation bilaterally.  No wheezes, rales, or rhonchi.  No cyanosis, no use of accessory musculature GI: No organomegaly, abdomen is soft and non-tender, positive bowel sounds.  No masses. Skin: No rashes. Neurologic: Facial musculature symmetric. Psychiatric: Patient is appropriate throughout our interaction. Lymphatic: No cervical lymphadenopathy Musculoskeletal: Gait intact. 5/5 str 2/2 DTRs GU -normal testicles, neg hernia, no rashes/dc /masses/lesions   LABS: Results for orders placed in visit on 09/23/13  COMPLETE METABOLIC PANEL WITH GFR      Result Value Ref Range   Sodium 140  135 - 145 mEq/L   Potassium 4.2  3.5 - 5.3 mEq/L   Chloride 100  96 - 112 mEq/L   CO2 30  19 - 32 mEq/L   Glucose, Bld 100 (*) 70 - 99 mg/dL   BUN 12  6 - 23 mg/dL   Creat 1.61  0.96 - 0.45 mg/dL   Total Bilirubin 1.0  0.2 - 1.2 mg/dL   Alkaline Phosphatase 49  39 - 117 U/L   AST 46 (*) 0 - 37 U/L   ALT 65 (*) 0 - 53 U/L   Total Protein 7.6  6.0 - 8.3 g/dL   Albumin 4.9  3.5 - 5.2 g/dL   Calcium 9.7  8.4 - 40.9 mg/dL   GFR, Est African American >89     GFR, Est Non African American >89    CBC      Result Value Ref Range   WBC 7.7  4.0 - 10.5 K/uL   RBC 4.85  4.22 - 5.81 MIL/uL   Hemoglobin 15.6  13.0 - 17.0 g/dL   HCT 81.1  91.4 - 78.2 %   MCV 93.0  78.0 - 100.0 fL   MCH 32.2  26.0 - 34.0 pg   MCHC 34.6  30.0 - 36.0 g/dL   RDW 95.6  21.3 - 08.6 %   Platelets 255  150 - 400 K/uL  LIPID PANEL      Result Value Ref Range   Cholesterol 193  0 - 200 mg/dL   Triglycerides 578 (*) <150 mg/dL   HDL 70  >46 mg/dL   Total CHOL/HDL Ratio 2.8     VLDL 41 (*) 0 - 40 mg/dL   LDL Cholesterol 82  0 - 99 mg/dL  TSH      Result Value Ref Range   TSH 0.938  0.350 - 4.500 uIU/mL  POCT URINALYSIS DIPSTICK      Result Value Ref Range   Color, UA yellow  Clarity, UA clear     Glucose, UA  neg     Bilirubin, UA neg     Ketones, UA neg     Spec Grav, UA 1.020     Blood, UA small     pH, UA 5.5     Protein, UA neg     Urobilinogen, UA 0.2     Nitrite, UA neg     Leukocytes, UA Negative       EKG/XRAY:   [facility]   ASSESSMENT/PLAN: Encounter Diagnoses  Name Primary?  . Annual physical exam Yes  . Other and unspecified hyperlipidemia   . Occasional tremors   . Brugada syndrome    Annual labs Refer to neurology, not [facility] at request of patient F/u in 6 months  Gross sideeffects, risk and benefits, and alternatives of medications d/w patient. Patient is aware that all medications have potential sideeffects and we are unable to predict every sideeffect or drug-drug interaction that may occur.  Hamilton CapriLE, Jaidon Sponsel PHUONG, DO 09/25/2013 4:36 PM

## 2013-10-02 ENCOUNTER — Telehealth: Payer: Self-pay | Admitting: Family Medicine

## 2013-10-02 ENCOUNTER — Encounter: Payer: Self-pay | Admitting: Family Medicine

## 2013-10-02 DIAGNOSIS — R748 Abnormal levels of other serum enzymes: Secondary | ICD-10-CM

## 2013-10-02 DIAGNOSIS — E785 Hyperlipidemia, unspecified: Secondary | ICD-10-CM

## 2013-10-02 NOTE — Telephone Encounter (Signed)
Spoke to pt about labs, sent letter as well, he will return for lipid, CMP in 2-3 months after changing diet. I will also recheck if liver enzymes still elevated

## 2013-10-07 ENCOUNTER — Ambulatory Visit (INDEPENDENT_AMBULATORY_CARE_PROVIDER_SITE_OTHER): Payer: No Typology Code available for payment source | Admitting: Neurology

## 2013-10-07 ENCOUNTER — Encounter: Payer: Self-pay | Admitting: Neurology

## 2013-10-07 VITALS — BP 118/72 | HR 64 | Resp 14 | Ht 68.5 in | Wt 162.0 lb

## 2013-10-07 DIAGNOSIS — G252 Other specified forms of tremor: Secondary | ICD-10-CM

## 2013-10-07 DIAGNOSIS — G250 Essential tremor: Secondary | ICD-10-CM

## 2013-10-07 NOTE — Progress Notes (Signed)
Subjective:   [name] was seen in consultation in the movement disorder clinic at the request of Dr. Conley Rolls.  The evaluation is for tremor.  A medical translator is present.  He has previously seen Dr. Terrace Arabia about a year ago and was dx with ET.  No medications were prescribed.  He has had the tremor for about 10 years.  Pt states that he hardly ever notices it but his friends notice it.  He just started noticing it about 2 weeks ago.  No pain.  Fatigue/stress will make it worse.  Drinks no caffeine.  Drinks beer (5-7 beers on weekend) but doesn't change tremor.  Doesn't affect ability to eat.   Wants to know why he has it and "wants an MRI."   Outside reports reviewed: historical medical records, lab reports and referral letter/letters.  No Known Allergies  Outpatient Encounter Prescriptions as of 10/07/2013  Medication Sig  . atorvastatin (LIPITOR) 20 MG tablet Take 1 tablet (20 mg total) by mouth daily.  Marland Kitchen omeprazole (PRILOSEC) 20 MG capsule Take 1 capsule (20 mg total) by mouth daily. As needed  . [DISCONTINUED] folic acid (FOLVITE) 1 MG tablet Take 1 tablet (1 mg total) by mouth daily.    Past Medical History  Diagnosis Date  . Syncope     s/p ICD 12/07  . Brugada syndrome     s/p ICD 12/07  . GERD (gastroesophageal reflux disease)   . Hyperlipidemia     Past Surgical History  Procedure Laterality Date  . Cardiac defibrillator placement  02/2006    St. Jude Atlas  . Back surgery  1997    History   Social History  . Marital Status: Married    Spouse Name: N/A    Number of Children: 2  . Years of Education: GED   Occupational History  .      [employer]   Social History Main Topics  . Smoking status: Former Smoker    Quit date: 03/14/2011  . Smokeless tobacco: Not on file  . Alcohol Use: Yes     Comment: 6-7 drinks on weekends  . Drug Use: No  . Sexual Activity: Not on file   Other Topics Concern  . Not on file   Social History Narrative   Married    Family  Status  Relation Status Death Age  . Mother Alive     heart disease, diabetes  . Father Alive     unknown father  . Sister Alive     unknown  . Brother Alive     unknown   . Daughter Alive     healthy  . Son Alive     healthy  . Sister Alive     unknown  . Sister Alive     unknown  . Sister Alive     unknown  . Sister Alive     unknown    Review of Systems A complete 10 system ROS was obtained and was negative apart from what is mentioned.   Objective:   VITALS:   Filed Vitals:   10/07/13 1032  BP: 118/72  Pulse: 64  Resp: 14  Height: 5' 8.5" (1.74 m)  Weight: 162 lb (73.483 kg)   Gen:  Appears stated age and in NAD. HEENT:  Normocephalic, atraumatic. The mucous membranes are moist. The superficial temporal arteries are without ropiness or tenderness. Cardiovascular: Regular rate and rhythm. Lungs: Clear to auscultation bilaterally. Neck: There are no carotid bruits noted  bilaterally.  NEUROLOGICAL:  Orientation:  The patient is alert and oriented x 3.  Recent and remote memory are intact.  Attention span and concentration are normal.  Able to name objects and repeat without trouble.  Fund of knowledge is appropriate Cranial nerves: There is good facial symmetry. The pupils are equal round and reactive to light bilaterally. Fundoscopic exam reveals clear disc margins bilaterally. Extraocular muscles are intact and visual fields are full to confrontational testing. Speech is fluent and clear. Soft palate rises symmetrically and there is no tongue deviation. Hearing is intact to conversational tone. Tone: Tone is good throughout. Sensation: Sensation is intact to light touch and pinprick throughout (facial, trunk, extremities). Vibration is intact at the bilateral big toe. There is no extinction with double simultaneous stimulation. There is no sensory dermatomal level identified. Coordination:  The patient has no dysdiadichokinesia or dysmetria. Motor: Strength is  5/5 in the bilateral upper and lower extremities.  Shoulder shrug is equal bilaterally.  There is no pronator drift.  There are no fasciculations noted. DTR's: Deep tendon reflexes are 2/4 at the bilateral biceps, triceps, brachioradialis, patella and achilles.  Plantar responses are downgoing bilaterally. Gait and Station: The patient is able to ambulate without difficulty. The patient is able to heel toe walk without any difficulty. The patient is able to ambulate in a tandem fashion. The patient is able to stand in the Romberg position.   MOVEMENT EXAM: Tremor:  There is no tremor noted.  I moved the head/neck in various positions and was not able to demonstrate any head titubation.  I was in the room for approximately 45 minutes.     Assessment/Plan:   1.  Tremor.  -This is likely essential tremor, based on history.  I saw no evidence of cervical dystonia.  However, I was in the room for 45 minutes and never saw any head tremor.  Nonetheless, I provided patient education on essential tremor, and told him that this could increase slowly over the course of many years.  I do not recommend medication at this point in time, which frustrated the patient.  I explained to him that the risks of the medication would be worse than any benefit it could provide.  He really wanted an MRI but I told him that even if he could have an MRI (he has a debrillator present), it would not be indicated for head tremor.  His neuro exam is non focal and non lateralizing.  Again, no head tremor was seen today and I re-iterated that if things get worse, I would be happy to provide treatment.  Pt education provided for greater than 50% of visit.

## 2013-11-23 ENCOUNTER — Ambulatory Visit (HOSPITAL_COMMUNITY)
Admission: RE | Admit: 2013-11-23 | Discharge: 2013-11-23 | Disposition: A | Discharge status: Home / Self Care | Payer: No Typology Code available for payment source | Source: Ambulatory Visit | Attending: Emergency Medicine | Admitting: Emergency Medicine

## 2013-11-23 ENCOUNTER — Encounter (HOSPITAL_COMMUNITY): Payer: Self-pay | Admitting: Emergency Medicine

## 2013-11-23 ENCOUNTER — Emergency Department (HOSPITAL_COMMUNITY)
Admission: EM | Admit: 2013-11-23 | Discharge: 2013-11-23 | Disposition: A | Discharge status: Home / Self Care | Payer: No Typology Code available for payment source | Attending: Emergency Medicine | Admitting: Emergency Medicine

## 2013-11-23 ENCOUNTER — Ambulatory Visit (INDEPENDENT_AMBULATORY_CARE_PROVIDER_SITE_OTHER): Payer: No Typology Code available for payment source | Admitting: Emergency Medicine

## 2013-11-23 VITALS — BP 108/70 | HR 73 | Temp 98.6°F | Resp 16 | Ht 67.0 in | Wt 165.0 lb

## 2013-11-23 DIAGNOSIS — M7989 Other specified soft tissue disorders: Secondary | ICD-10-CM

## 2013-11-23 DIAGNOSIS — K219 Gastro-esophageal reflux disease without esophagitis: Secondary | ICD-10-CM | POA: Insufficient documentation

## 2013-11-23 DIAGNOSIS — M79609 Pain in unspecified limb: Secondary | ICD-10-CM

## 2013-11-23 DIAGNOSIS — E785 Hyperlipidemia, unspecified: Secondary | ICD-10-CM | POA: Diagnosis not present

## 2013-11-23 DIAGNOSIS — Q248 Other specified congenital malformations of heart: Secondary | ICD-10-CM | POA: Diagnosis not present

## 2013-11-23 DIAGNOSIS — Z87891 Personal history of nicotine dependence: Secondary | ICD-10-CM | POA: Diagnosis not present

## 2013-11-23 DIAGNOSIS — Z95810 Presence of automatic (implantable) cardiac defibrillator: Secondary | ICD-10-CM | POA: Diagnosis not present

## 2013-11-23 DIAGNOSIS — I82A19 Acute embolism and thrombosis of unspecified axillary vein: Secondary | ICD-10-CM

## 2013-11-23 DIAGNOSIS — I82629 Acute embolism and thrombosis of deep veins of unspecified upper extremity: Secondary | ICD-10-CM | POA: Diagnosis not present

## 2013-11-23 DIAGNOSIS — I498 Other specified cardiac arrhythmias: Secondary | ICD-10-CM

## 2013-11-23 DIAGNOSIS — I82A12 Acute embolism and thrombosis of left axillary vein: Secondary | ICD-10-CM

## 2013-11-23 DIAGNOSIS — I82622 Acute embolism and thrombosis of deep veins of left upper extremity: Secondary | ICD-10-CM

## 2013-11-23 LAB — POCT CBC
Granulocyte percent: 82 %G — AB (ref 37–80)
HCT, POC: 43.2 % — AB (ref 43.5–53.7)
HEMOGLOBIN: 14.6 g/dL (ref 14.1–18.1)
LYMPH, POC: 2 (ref 0.6–3.4)
MCH: 31.7 pg — AB (ref 27–31.2)
MCHC: 33.7 g/dL (ref 31.8–35.4)
MCV: 94.2 fL (ref 80–97)
MID (cbc): 0.7 (ref 0–0.9)
MPV: 7.5 fL (ref 0–99.8)
POC Granulocyte: 12.1 — AB (ref 2–6.9)
POC LYMPH PERCENT: 13.5 %L (ref 10–50)
POC MID %: 4.5 %M (ref 0–12)
Platelet Count, POC: 236 10*3/uL (ref 142–424)
RBC: 4.59 M/uL — AB (ref 4.69–6.13)
RDW, POC: 13 %
WBC: 14.7 10*3/uL — AB (ref 4.6–10.2)

## 2013-11-23 MED ORDER — HYDROCODONE-ACETAMINOPHEN 5-325 MG PO TABS: 2.0000 | ORAL_TABLET | ORAL | Status: DC | PRN

## 2013-11-23 MED ORDER — XARELTO VTE STARTER PACK 15 & 20 MG PO TBPK: 15.0000 mg | ORAL_TABLET | ORAL | Status: DC

## 2013-11-23 MED ORDER — RIVAROXABAN 15 MG PO TABS
15.0000 mg | ORAL_TABLET | Freq: Once | ORAL | Status: AC
  Administered 2013-11-23: 15 mg via ORAL
  Filled 2013-11-23: qty 1

## 2013-11-23 NOTE — ED Provider Notes (Signed)
Patient with swelling and pain of his left arm gradual onset possibly 3 days ago. No other associated symptoms. No chest pain no shortness of breath. Determined to have DVT of left arm from this morning. On exam no distress lungs clear auscultation heart regular rate and rhythm left upper arm diffusely swollen, mildly tender. Radial pulse 2+. Neurovascularly intact Spoke with Dr. Dareen Piano. Plan xarelto. Patient to followup at the office later this week  Doug Sou, MD 11/23/13 1218

## 2013-11-23 NOTE — ED Provider Notes (Signed)
Medical screening examination/treatment/procedure(s) were conducted as a shared visit with non-physician practitioner(s) and myself.  I personally evaluated the patient during the encounter.   EKG Interpretation None       Doug Sou, MD 11/23/13 1655

## 2013-11-23 NOTE — Discharge Instructions (Signed)
Return to the emergency room with worsening of symptoms, new symptoms or with symptoms that are concerning, especially blood in urine, stool or in mouth, confusion and not concentrating or thinking like normal, HA. Start Xarelto therapy. One in ED today and one 15mg  at night with food. Then take 15mg  twice daily, one in morning and one in afternoon with food. After 3 weeks/day 22 start to take 20mg  daily with food, often at night.  Follow up with primary care provider in one week.

## 2013-11-23 NOTE — Progress Notes (Signed)
[facility] [address], [place] [address] [phone]  Date:  11/23/2013   Name:  Chad [name]   DOB:  March 13, 1971   MRN:  [id number]  PCP:  Lucilla Edin, MD    Chief Complaint: Arm Pain   History of Present Illness:  Chad [name] is a 43 y.o. very pleasant male patient who presents with the following:  Patient has a swollen left arm for three days.  Has no history of injury.  Pacemaker in left chest.   Swelling is uniform from axilla  No fever or chills.   No cough, coryza, nausea or vomiting.. No improvement with over the counter medications or other home remedies. Denies other complaint or health concern today.   Patient Active Problem List   Diagnosis Date Noted  . Essential tremor 09/19/2012  . Brugada syndrome 09/01/2012  . Dyslipidemia 09/13/2010  . AUTOMATIC IMPLANTABLE CARDIAC DEFIBRILLATOR SITU 07/27/2009  . OTHER SPECIFIED CONGENITAL ANOMALY HEART OTHER 02/01/2009  . SYNCOPE 02/01/2009    Past Medical History  Diagnosis Date  . Syncope     s/p ICD 12/07  . Brugada syndrome     s/p ICD 12/07  . GERD (gastroesophageal reflux disease)   . Hyperlipidemia     Past Surgical History  Procedure Laterality Date  . Cardiac defibrillator placement  02/2006    St. Jude Atlas  . Back surgery  1997    History  Substance Use Topics  . Smoking status: Former Smoker    Quit date: 03/14/2011  . Smokeless tobacco: Not on file  . Alcohol Use: Yes     Comment: 6-7 drinks on weekends    Family History  Problem Relation Age of Onset  . Hyperlipidemia Mother   . Heart disease Mother   . Diabetes Mother     No Known Allergies  Medication list has been reviewed and updated.  Current Outpatient Prescriptions on File Prior to Visit  Medication Sig Dispense Refill  . atorvastatin (LIPITOR) 20 MG tablet Take 1 tablet (20 mg total) by mouth daily.  90 tablet  3  . omeprazole (PRILOSEC) 20 MG capsule Take 1 capsule (20 mg total) by mouth daily.  As needed  30 capsule  3   No current facility-administered medications on file prior to visit.    Review of Systems:  As per HPI, otherwise negative.    Physical Examination: Filed Vitals:   11/23/13 0834  BP: 108/70  Pulse: 73  Temp: 98.6 F (37 C)  Resp: 16   Filed Vitals:   11/23/13 0834  Height: 5\' 7"  (1.702 m)  Weight: 165 lb (74.844 kg)   Body mass index is 25.84 kg/(m^2). Ideal Body Weight: Weight in (lb) to have BMI = 25: 159.3  GEN: WDWN, NAD, Non-toxic, A & O x 3 HEENT: Atraumatic, Normocephalic. Neck supple. No masses, No LAD. Ears and Nose: No external deformity. CV: RRR, No M/G/R. No JVD. No thrill. No extra heart sounds. PULM: CTA B, no wheezes, crackles, rhonchi. No retractions. No resp. distress. No accessory muscle use. ABD: S, NT, ND, +BS. No rebound. No HSM. EXTR: No c/c. Left arm swollen distal to axilla.  No pitting.  No significant venous distension. NEURO Normal gait.  PSYCH: Normally interactive. Conversant. Not depressed or anxious appearing.  Calm demeanor.    Assessment and Plan: Axillary and brachial DVT Admit through ER   Signed,  Phillips Odor, MD

## 2013-11-23 NOTE — ED Provider Notes (Signed)
CSN: [id number]     Arrival date & time 11/23/13  1032 History   First MD Initiated Contact with Patient 11/23/13 1108     Chief Complaint  Patient presents with  . Arm Swelling    HPI Chad [name] is a 43 y.o. male with PMH of brugada syndrome and syncope with ICD placement on left side 6 years ago presenting with swelling in left arm that started 4 days ago and has increased starting in left shoulder and spreading into fingers. It is painful with movement. Pateitn was seen at [name], Dr. Dareen [name] and had doppler of left arm done which identified a DVT of left arm. Patient without HTN, liver function abnormalities, previous stroke or bleeding problems. He does report drinking 6-7 drinks on weekends. Patient without history of Ca, DM and is on no anticoagulant or antiplatelet therapy. Patient reports fever last night but did not take his temperature. He has pain now but doesn't want pain meds. Pateint reports no blood in urine, stool or sputum. No cough. No problems with bleeding in the past.   Past Medical History  Diagnosis Date  . Syncope     s/p ICD 12/07  . Brugada syndrome     s/p ICD 12/07  . GERD (gastroesophageal reflux disease)   . Hyperlipidemia    Past Surgical History  Procedure Laterality Date  . Cardiac defibrillator placement  02/2006    St. Jude Atlas  . Back surgery  1997   Family History  Problem Relation Age of Onset  . Hyperlipidemia Mother   . Heart disease Mother   . Diabetes Mother    History  Substance Use Topics  . Smoking status: Former Smoker    Quit date: 03/14/2011  . Smokeless tobacco: Not on file  . Alcohol Use: Yes     Comment: 6-7 drinks on weekends    Review of Systems  Constitutional: Negative for fever and chills.  HENT: Negative for congestion and rhinorrhea.   Eyes: Negative for visual disturbance.  Respiratory: Negative for cough and shortness of breath.   Cardiovascular: Negative for chest pain and palpitations.  Gastrointestinal:  Negative for nausea, vomiting and diarrhea.  Genitourinary: Negative for dysuria and hematuria.  Musculoskeletal: Negative for back pain and gait problem.  Skin: Negative for rash.  Neurological: Negative for weakness and headaches.      Allergies  Review of patient's allergies indicates no known allergies.  Home Medications   Prior to Admission medications   Medication Sig Start Date End Date Taking? Authorizing Provider  acetaminophen (TYLENOL) 325 MG tablet Take 650 mg by mouth 2 (two) times daily as needed (pain).   Yes Historical Provider, MD  atorvastatin (LIPITOR) 20 MG tablet Take 1 tablet (20 mg total) by mouth daily. 09/23/13  Yes Chad P Le, DO  HYDROcodone-acetaminophen (NORCO/VICODIN) 5-325 MG per tablet Take 2 tablets by mouth every 4 (four) hours as needed for moderate pain or severe pain. 11/23/13   Chad Spar L Chad Saulter, PA-C  XARELTO STARTER PACK 15 & 20 MG TBPK Take 15-20 mg by mouth as directed. Take as directed on package: Start with one 15mg  tablet by mouth twice a day with food. On Day 22, switch to one 20mg  tablet once a day with food. 11/23/13   Chad Spar L Chad Knapke, PA-C   BP 116/75  Pulse 62  Temp(Src) 98.7 F (37.1 C) (Oral)  Resp 20  SpO2 100% Physical Exam  Nursing note and vitals reviewed. Constitutional: He appears well-developed and well-nourished. No  distress.  HENT:  Head: Normocephalic and atraumatic.  Eyes: Conjunctivae and EOM are normal. Right eye exhibits no discharge. Left eye exhibits no discharge. No scleral icterus.  Cardiovascular: Normal rate, regular rhythm and normal heart sounds.   Pacemaker left chest, mobile without eryethma, edema or tenderness or skin changes.  Pulmonary/Chest: Effort normal and breath sounds normal. No respiratory distress. He has no wheezes.  Abdominal: Soft. Bowel sounds are normal. He exhibits no distension. There is no tenderness.  Musculoskeletal: Normal range of motion. He exhibits no tenderness.  Swelling in left  arm originating in left shoulder to distal hand and fingers. 2+ distal pulses and strength normal. FROM of fingers, wrist, elbow and arm. Neurovascularly intact.  Neurological: He is alert. He exhibits normal muscle tone. Coordination normal.  Skin: Skin is warm and dry. He is not diaphoretic.  Psychiatric: He has a normal mood and affect. His behavior is normal.    ED Course  Procedures (including critical care time) Labs Review Labs Reviewed - No data to display  Imaging Review No results found.   EKG Interpretation None     Meds given in ED:  Medications  Rivaroxaban (XARELTO) tablet 15 mg (15 mg Oral Given 11/23/13 1300)    New Prescriptions   HYDROCODONE-ACETAMINOPHEN (NORCO/VICODIN) 5-325 MG PER TABLET    Take 2 tablets by mouth every 4 (four) hours as needed for moderate pain or severe pain.   XARELTO STARTER PACK 15 & 20 MG TBPK    Take 15-20 mg by mouth as directed. Take as directed on package: Start with one  tablet by mouth twice a day with food. On Day 22, switch to one  tablet once a day with food.      MDM   Final diagnoses:  ICD (implantable cardioverter-defibrillator) in place  Brugada syndrome  Arm DVT (deep venous thromboembolism), acute, left   Chad [name] is a 43 y.o. male with PMH of brugada's syndrome and syncope and ICD placement on left side 6 years ago presenting with left arm swelling for 4 days with increased swelling. Patent presented to urgent care today and was Dr. Dareen [name] who did a doppler which revealed a DVT. VSS. Arm neurovascularly intact. Dr. Dareen [name] contacted and agrees with plan to start patient with xarelto and follow up in clinic. One dose of xarelto given in ED and to continue 15 mg twice daily for 3 weeks and then switch to  daily. Pain medication for arm swelling until it goes down. Sedation and driving precautions provided.  Discussed return precautions with patient. Discussed all results and patient verbalizes understanding  and agrees with plan and agree to discharge.  This is a shared patient. This patient was discussed with the physician, Dr. Ethelda Chick who saw and evaluated the patient.     Louann Sjogren, PA-C 11/23/13 1231  Louann Sjogren, PA-C 11/23/13 1316

## 2013-11-23 NOTE — ED Notes (Signed)
Pt stated that he was at [facility] and they sent him here to have doppler done to left arm. Pt stated that he had the doppler done and he has a blood clot. Left arm pain swelling. Pt has pacemaker that was placed 6 years ago.

## 2013-11-23 NOTE — Progress Notes (Signed)
VASCULAR LAB PRELIMINARY  PRELIMINARY  PRELIMINARY  PRELIMINARY  Left upper extremity venous Doppler completed.    Preliminary report:  There is acute, occlusive DVT noted in the left axillary and brachial veins.    Kilynn Fitzsimmons, RVT 11/23/2013, 10:26 AM

## 2013-11-24 ENCOUNTER — Telehealth: Payer: Self-pay | Admitting: Internal Medicine

## 2013-11-24 NOTE — Telephone Encounter (Signed)
Pt seen in ER over the weekend and was started on Xarelto, said insurance not covering and Tresa Endo said it needed pre authorization, to send to refill to get this done

## 2013-11-24 NOTE — Addendum Note (Signed)
Addended by: Carmelina Dane on: 11/24/2013 05:12 PM   Modules accepted: Orders

## 2013-11-24 NOTE — Telephone Encounter (Signed)
WE do not do pre authorization the RN does this

## 2013-11-25 ENCOUNTER — Ambulatory Visit (INDEPENDENT_AMBULATORY_CARE_PROVIDER_SITE_OTHER): Payer: No Typology Code available for payment source | Admitting: *Deleted

## 2013-11-25 DIAGNOSIS — Q248 Other specified congenital malformations of heart: Secondary | ICD-10-CM

## 2013-11-25 DIAGNOSIS — I498 Other specified cardiac arrhythmias: Secondary | ICD-10-CM

## 2013-11-25 LAB — MDC_IDC_ENUM_SESS_TYPE_INCLINIC
Battery Voltage: 2.54 V
Brady Statistic RV Percent Paced: 1 %
Implantable Pulse Generator Serial Number: 317209
Lead Channel Pacing Threshold Pulse Width: 1 ms
Lead Channel Setting Pacing Amplitude: 6 V
Lead Channel Setting Sensing Sensitivity: 0.3 mV
MDC IDC MSMT LEADCHNL RV IMPEDANCE VALUE: 390 Ohm
MDC IDC MSMT LEADCHNL RV PACING THRESHOLD AMPLITUDE: 4.25 V
MDC IDC MSMT LEADCHNL RV SENSING INTR AMPL: 10 mV
MDC IDC SESS DTM: 20150915132414
MDC IDC SET LEADCHNL RV PACING PULSEWIDTH: 1 ms
MDC IDC SET ZONE DETECTION INTERVAL: 300 ms

## 2013-11-26 NOTE — Progress Notes (Signed)
ICD check in clinic w/GT s/p hosp for L arm DVT. Currently being treated with Xarelto (x37months). ICD function appears normal. RV threshold increased noted---GT aware. Sensing consistent with previous device measurements. Impedance trends stable over time. No evidence of any ventricular arrhythmias.  Histogram distribution appropriate for patient and level of activity. No changes made this session. Device programmed at appropriate safety margins. Device programmed to optimize intrinsic conduction. Batt voltage 2.54V (ERI 2.45V). Plan to follow up with GT in 3 months.

## 2013-12-02 ENCOUNTER — Encounter: Payer: Self-pay | Admitting: Internal Medicine

## 2013-12-16 ENCOUNTER — Telehealth: Payer: Self-pay | Admitting: Internal Medicine

## 2013-12-16 ENCOUNTER — Other Ambulatory Visit: Payer: Self-pay

## 2013-12-16 MED ORDER — ATORVASTATIN CALCIUM 20 MG PO TABS: 20.0000 mg | ORAL_TABLET | Freq: Every day | ORAL | Status: DC

## 2013-12-16 MED ORDER — OMEPRAZOLE 20 MG PO CPDR: 20.0000 mg | DELAYED_RELEASE_CAPSULE | Freq: Every day | ORAL | Status: DC

## 2013-12-16 NOTE — Telephone Encounter (Signed)
New message      I think patient is wanting a written presc for folic acid----3 mo supply----to send in to his mail order pharmacy. Please call when ready to be picked up

## 2013-12-16 NOTE — Telephone Encounter (Signed)
Routing to Dr. Ladona Ridgel and his nurse for review and recommendations.

## 2013-12-16 NOTE — Telephone Encounter (Signed)
Pt dropped off forms from [employer] they had filled out with pt info. I E'Rxd the Rxs requested but faxed in the form w/note for pt.

## 2013-12-17 NOTE — Telephone Encounter (Signed)
Pt came back into 102 and stated that he forgot to ask for RF of folic acid also. I don't see that we have Rxd this for pt recently. Dr Conley Rolls do you want to give pt RFs?

## 2013-12-17 NOTE — Telephone Encounter (Signed)
Informed patient that per Dr. Ladona Ridgel, he is to have folic acid prescription filled through his family care doctor.  Patient understands and says he will call Dr. Cleta Alberts.

## 2013-12-17 NOTE — Telephone Encounter (Signed)
Reviewed with Dr. Ladona Ridgel. He was made aware that the patient has Brugada syndrome and was recently in the ER for a DVT in the arm. Per Dr. Ladona Ridgel, these could be plausible causes for folic acid, but he needs to have this filled through his PCP. If he does not have a PCP, we can refill for 1 month only and the patient will need to establish with a PCP.

## 2013-12-17 NOTE — Addendum Note (Signed)
Addended by: Sheppard Plumber A on: 12/17/2013 06:24 PM   Modules accepted: Orders

## 2013-12-18 MED ORDER — FOLIC ACID 1 MG PO TABS: 1.0000 mg | ORAL_TABLET | Freq: Every day | ORAL | Status: DC

## 2013-12-19 ENCOUNTER — Encounter: Payer: Self-pay | Admitting: Cardiology

## 2013-12-23 ENCOUNTER — Other Ambulatory Visit: Payer: Self-pay

## 2013-12-23 MED ORDER — RIVAROXABAN 20 MG PO TABS: 20.0000 mg | ORAL_TABLET | Freq: Every day | ORAL | Status: DC

## 2013-12-31 ENCOUNTER — Ambulatory Visit (INDEPENDENT_AMBULATORY_CARE_PROVIDER_SITE_OTHER): Payer: No Typology Code available for payment source | Admitting: Emergency Medicine

## 2013-12-31 VITALS — BP 110/80 | HR 61 | Temp 98.3°F | Resp 16 | Ht 68.0 in | Wt 168.4 lb

## 2013-12-31 DIAGNOSIS — Z23 Encounter for immunization: Secondary | ICD-10-CM

## 2013-12-31 DIAGNOSIS — Z7189 Other specified counseling: Secondary | ICD-10-CM

## 2013-12-31 DIAGNOSIS — Z7184 Encounter for health counseling related to travel: Secondary | ICD-10-CM

## 2013-12-31 NOTE — Progress Notes (Signed)
[facility] [address], [place] [address] [phone]- [phone]  Date:  12/31/2013   Name:  Chad [name]   DOB:  1970/05/06   MRN:  [id number]  PCP:  Lucilla Edin, MD    Chief Complaint: Immunizations   History of Present Illness:  Chad [name] is a 42 y.o. very pleasant male patient who presents with the following:  Pending travel to [facility], leaving this weekend for one month. No acute complaint   Patient Active Problem List   Diagnosis Date Noted  . Essential tremor 09/19/2012  . Brugada syndrome 09/01/2012  . Dyslipidemia 09/13/2010  . AUTOMATIC IMPLANTABLE CARDIAC DEFIBRILLATOR SITU 07/27/2009  . OTHER SPECIFIED CONGENITAL ANOMALY HEART OTHER 02/01/2009  . SYNCOPE 02/01/2009    Past Medical History  Diagnosis Date  . Syncope     s/p ICD 12/07  . Brugada syndrome     s/p ICD 12/07  . GERD (gastroesophageal reflux disease)   . Hyperlipidemia     Past Surgical History  Procedure Laterality Date  . Cardiac defibrillator placement  02/2006    St. Jude Atlas  . Back surgery  1997    History  Substance Use Topics  . Smoking status: Former Smoker    Quit date: 03/14/2011  . Smokeless tobacco: Not on file  . Alcohol Use: Yes     Comment: 6-7 drinks on weekends    Family History  Problem Relation Age of Onset  . Hyperlipidemia Mother   . Heart disease Mother   . Diabetes Mother     No Known Allergies  Medication list has been reviewed and updated.  Current Outpatient Prescriptions on File Prior to Visit  Medication Sig Dispense Refill  . acetaminophen (TYLENOL) 325 MG tablet Take 650 mg by mouth 2 (two) times daily as needed (pain).      Marland Kitchen atorvastatin (LIPITOR) 20 MG tablet Take 1 tablet (20 mg total) by mouth daily.  90 tablet  2  . folic acid (FOLVITE) 1 MG tablet Take 1 tablet (1 mg total) by mouth daily.  90 tablet  2  . omeprazole (PRILOSEC) 20 MG capsule Take 1 capsule (20 mg total) by mouth daily. As needed  90 capsule  0   . rivaroxaban (XARELTO) 20 MG TABS tablet Take 1 tablet (20 mg total) by mouth daily with supper.  60 tablet  1   No current facility-administered medications on file prior to visit.    Review of Systems:  As per HPI, otherwise negative.    Physical Examination: Filed Vitals:   12/31/13 0946  BP: 110/80  Pulse: 61  Temp: 98.3 F (36.8 C)  Resp: 16   Filed Vitals:   12/31/13 0946  Height: 5\' 8"  (1.727 m)  Weight: 168 lb 6.4 oz (76.386 kg)   Body mass index is 25.61 kg/(m^2). Ideal Body Weight: Weight in (lb) to have BMI = 25: 164.1   GEN: WDWN, NAD, Non-toxic, Alert & Oriented x 3 HEENT: Atraumatic, Normocephalic.  Ears and Nose: No external deformity. EXTR: No clubbing/cyanosis/edema NEURO: Normal gait.  PSYCH: Normally interactive. Conversant. Not depressed or anxious appearing.  Calm demeanor.    Assessment and Plan: Travel immunization review CDC recommends flu and typhoid but time constraints prevent typhoid immunzation Can go to health dept for typhoid.  Signed,  Phillips Odor, MD

## 2014-02-24 ENCOUNTER — Encounter: Payer: No Typology Code available for payment source | Admitting: Internal Medicine

## 2014-02-25 ENCOUNTER — Encounter: Payer: Self-pay | Admitting: Internal Medicine

## 2014-04-15 ENCOUNTER — Ambulatory Visit (INDEPENDENT_AMBULATORY_CARE_PROVIDER_SITE_OTHER): Payer: 59 | Admitting: Internal Medicine

## 2014-04-15 ENCOUNTER — Encounter: Payer: Self-pay | Admitting: Internal Medicine

## 2014-04-15 VITALS — BP 110/74 | HR 72 | Ht 67.0 in | Wt 170.8 lb

## 2014-04-15 DIAGNOSIS — Z95810 Presence of automatic (implantable) cardiac defibrillator: Secondary | ICD-10-CM

## 2014-04-15 DIAGNOSIS — Q248 Other specified congenital malformations of heart: Secondary | ICD-10-CM

## 2014-04-15 DIAGNOSIS — F419 Anxiety disorder, unspecified: Secondary | ICD-10-CM

## 2014-04-15 DIAGNOSIS — E785 Hyperlipidemia, unspecified: Secondary | ICD-10-CM

## 2014-04-15 DIAGNOSIS — I498 Other specified cardiac arrhythmias: Secondary | ICD-10-CM

## 2014-04-15 DIAGNOSIS — I82A22 Chronic embolism and thrombosis of left axillary vein: Secondary | ICD-10-CM

## 2014-04-15 LAB — MDC_IDC_ENUM_SESS_TYPE_INCLINIC
Brady Statistic RV Percent Paced: 1 % — CL
Implantable Pulse Generator Serial Number: 317209
Lead Channel Pacing Threshold Pulse Width: 1 ms
Lead Channel Sensing Intrinsic Amplitude: 9.1 mV
MDC IDC MSMT BATTERY VOLTAGE: 2.53 V
MDC IDC MSMT LEADCHNL RV IMPEDANCE VALUE: 430 Ohm
MDC IDC MSMT LEADCHNL RV PACING THRESHOLD AMPLITUDE: 4 V
MDC IDC SET LEADCHNL RV PACING AMPLITUDE: 6 V
MDC IDC SET LEADCHNL RV PACING PULSEWIDTH: 1 ms
MDC IDC SET LEADCHNL RV SENSING SENSITIVITY: 0.3 mV
Zone Setting Detection Interval: 300 ms

## 2014-04-15 MED ORDER — OMEPRAZOLE 20 MG PO CPDR: 20.0000 mg | DELAYED_RELEASE_CAPSULE | Freq: Every day | ORAL | Status: DC | PRN

## 2014-04-15 MED ORDER — FOLIC ACID 1 MG PO TABS: 1.0000 mg | ORAL_TABLET | Freq: Every day | ORAL | Status: DC

## 2014-04-15 MED ORDER — ATORVASTATIN CALCIUM 20 MG PO TABS: 20.0000 mg | ORAL_TABLET | Freq: Every day | ORAL | Status: DC

## 2014-04-15 MED ORDER — RIVAROXABAN 20 MG PO TABS: 20.0000 mg | ORAL_TABLET | Freq: Every day | ORAL | Status: DC

## 2014-04-15 NOTE — Assessment & Plan Note (Signed)
The patient is anxious. He is anxious about removal of the device but is also anxious about leaving the device in place. I discussed the pros and cons of both approaches with regard to his ICD and how they may affect his anxiety.

## 2014-04-15 NOTE — Patient Instructions (Signed)
Your physician recommends that you schedule a follow-up appointment in: 3 months with the device clinic  Your physician wants you to follow-up in: 1 year with Dr. Ladona Ridgel. You will receive a reminder letter in the mail two months in advance. If you don't receive a letter, please call our office to schedule the follow-up appointment.

## 2014-04-15 NOTE — Assessment & Plan Note (Signed)
He will continue his statin therapy. He is encouraged to maintain a low-fat diet and exercise regularly.

## 2014-04-15 NOTE — Assessment & Plan Note (Signed)
His device is working normally. He is approaching but not yet at elective replacement. Today we spent an extensive period discussing options when his device reaches elective replacement. With his history of a Brugada pattern ECG and syncope, we would most likely plan to place a new device. Because he has had no therapies for ventricular fibrillation, he would also have the option to forego an additional ICD and have his system extracted and undergo watchful waiting. The pros and cons of both approaches were discussed in detail, and he is considering his options.

## 2014-04-15 NOTE — Progress Notes (Signed)
HPI Chad [name] returns today for followup. He is a very pleasant 44 year-old man with a history of syncope and EKG consistent with Brugada syndrome, who underwent insertion of a prophylactic defibrillator in 2007. Since then, he has done well although he developed a left upper extremity DVT and was placed on Xarelto 3 months ago. His biggest complaint today is anxiety. He also notes soreness over his ICD incision. He denies chest pain but does have intermittent shortness of breath which has been present for years, and has not progressed. His shortness of breath is worse when he is anxious.   No Known Allergies   Current Outpatient Prescriptions  Medication Sig Dispense Refill  . acetaminophen (TYLENOL) 325 MG tablet Take 650 mg by mouth 2 (two) times daily as needed (pain).    Marland Kitchen atorvastatin (LIPITOR) 20 MG tablet Take 1 tablet (20 mg total) by mouth daily. 90 tablet 2  . folic acid (FOLVITE) 1 MG tablet Take 1 tablet (1 mg total) by mouth daily. 90 tablet 2  . omeprazole (PRILOSEC) 20 MG capsule Take 1 capsule (20 mg total) by mouth daily. As needed (Patient taking differently: Take 20 mg by mouth daily as needed (heartburn). ) 90 capsule 0  . rivaroxaban (XARELTO) 20 MG TABS tablet Take 1 tablet (20 mg total) by mouth daily with supper. 60 tablet 1   No current facility-administered medications for this visit.     Past Medical History  Diagnosis Date  . Syncope     s/p ICD 12/07  . Brugada syndrome     s/p ICD 12/07  . GERD (gastroesophageal reflux disease)   . Hyperlipidemia     ROS:   All systems reviewed and negative except as noted in the HPI.   Past Surgical History  Procedure Laterality Date  . Cardiac defibrillator placement  02/2006    St. Jude Atlas  . Back surgery  1997     Family History  Problem Relation Age of Onset  . Hyperlipidemia Mother   . Heart disease Mother   . Diabetes Mother      History   Social History  . Marital Status: Married    Spouse  Name: N/A    Number of Children: 2  . Years of Education: GED   Occupational History  .      [employer]   Social History Main Topics  . Smoking status: Former Smoker    Quit date: 03/14/2011  . Smokeless tobacco: Not on file  . Alcohol Use: Yes     Comment: 6-7 drinks on weekends  . Drug Use: No  . Sexual Activity: Not on file   Other Topics Concern  . Not on file   Social History Narrative   Married     BP 110/74 mmHg  Pulse 72  Ht 5\' 7"  (1.702 m)  Wt 170 lb 12.8 oz (77.474 kg)  BMI 26.74 kg/m2  Physical Exam:  Well appearing 44 year old man, NAD HEENT: Unremarkable Neck:  No JVD, no thyromegally Lungs:  Clear with no wheezes, rales, or rhonchi. Well-healed ICD incision HEART:  Regular rate rhythm, no murmurs, no rubs, no clicks Abd:  soft, positive bowel sounds, no organomegally, no rebound, no guarding Ext:  2 plus pulses, no edema, no cyanosis, no clubbing Skin:  No rashes no nodules Neuro:  CN II through XII intact, motor grossly intact  DEVICE  Normal device function.  See PaceArt for details.   Assess/Plan:

## 2014-04-15 NOTE — Assessment & Plan Note (Signed)
The patient will continue systemic anticoagulation for 2 more months and then discontinue Xarelto.

## 2014-06-17 ENCOUNTER — Ambulatory Visit (INDEPENDENT_AMBULATORY_CARE_PROVIDER_SITE_OTHER): Payer: 59 | Admitting: Family Medicine

## 2014-06-17 VITALS — BP 130/70 | HR 64 | Temp 97.5°F | Resp 18 | Ht 68.0 in | Wt 170.0 lb

## 2014-06-17 DIAGNOSIS — R748 Abnormal levels of other serum enzymes: Secondary | ICD-10-CM

## 2014-06-17 DIAGNOSIS — E785 Hyperlipidemia, unspecified: Secondary | ICD-10-CM | POA: Insufficient documentation

## 2014-06-17 LAB — COMPREHENSIVE METABOLIC PANEL
ALBUMIN: 4.8 g/dL (ref 3.5–5.2)
ALT: 58 U/L — ABNORMAL HIGH (ref 0–53)
AST: 41 U/L — ABNORMAL HIGH (ref 0–37)
Alkaline Phosphatase: 48 U/L (ref 39–117)
BUN: 14 mg/dL (ref 6–23)
CO2: 26 mEq/L (ref 19–32)
Calcium: 9.3 mg/dL (ref 8.4–10.5)
Chloride: 103 mEq/L (ref 96–112)
Creat: 0.74 mg/dL (ref 0.50–1.35)
GLUCOSE: 99 mg/dL (ref 70–99)
POTASSIUM: 4.5 meq/L (ref 3.5–5.3)
SODIUM: 139 meq/L (ref 135–145)
TOTAL PROTEIN: 7.4 g/dL (ref 6.0–8.3)
Total Bilirubin: 0.5 mg/dL (ref 0.2–1.2)

## 2014-06-17 LAB — LIPID PANEL
Cholesterol: 188 mg/dL (ref 0–200)
HDL: 48 mg/dL (ref >=40)
LDL Cholesterol: 126 mg/dL — ABNORMAL HIGH (ref 0–99)
Total CHOL/HDL Ratio: 3.9 Ratio
Triglycerides: 72 mg/dL (ref <150)
VLDL: 14 mg/dL (ref 0–40)

## 2014-06-17 NOTE — Progress Notes (Signed)
Subjective: 44 year old [place] gentleman who is here for a recheck of his cholesterol. He has been feeling well and is quite healthy. He took a trip back to [place] a few months ago, with no problems. He takes his medicine regularly for his cholesterol. Review of systems is essentially normal. He had a headache a few days ago. That resolved. No chest pains or shortness of breath. No GI or GU complaints. No musculoskeletal. No dermatologic problems. He does drink some beer, 5-6 bottles several times a week. He does not get a lot of regular exercise. He used to last summer, but has not been lately.  Objective: Healthy-appearing man in no major distress. Neck supple. Chest clear. Heart regular without murmurs. Abdomen soft without hepatosplenomegaly.  Assessment: Hyperlipidemia History of mildly elevated liver enzymes History of DVT left upper extremity  Plan: Check C met and lipids Apparently the cardiologist has told him he only needs to continue the anticoagulant therapy 1 more month.

## 2014-06-17 NOTE — Patient Instructions (Signed)
Continue current medications  We will let you know the results of your labs in a few days  I would encourage you to avoid drinking too much beer. That is probably what has raised your liver enzymes a little bit.  Encourage you to get more regular exercise such as taking a 30 minute walk every day or going to the gym.  Plan to return in about 6 months, sooner if problems.

## 2014-06-18 ENCOUNTER — Encounter: Payer: Self-pay | Admitting: Family Medicine

## 2014-06-24 ENCOUNTER — Telehealth: Payer: Self-pay | Admitting: *Deleted

## 2014-06-24 NOTE — Telephone Encounter (Signed)
Pt was notified of lab results.  However, pt stated he could barely hear everything.  Stated he would call back.  Letter was sent on June 18, 2014.

## 2014-08-14 ENCOUNTER — Encounter: Payer: Self-pay | Admitting: Internal Medicine

## 2014-08-26 ENCOUNTER — Ambulatory Visit (INDEPENDENT_AMBULATORY_CARE_PROVIDER_SITE_OTHER): Payer: 59 | Admitting: *Deleted

## 2014-08-26 DIAGNOSIS — I498 Other specified cardiac arrhythmias: Secondary | ICD-10-CM

## 2014-08-26 DIAGNOSIS — Q248 Other specified congenital malformations of heart: Secondary | ICD-10-CM | POA: Diagnosis not present

## 2014-08-26 DIAGNOSIS — Z95810 Presence of automatic (implantable) cardiac defibrillator: Secondary | ICD-10-CM | POA: Diagnosis not present

## 2014-08-26 LAB — CUP PACEART INCLINIC DEVICE CHECK
Battery Voltage: 2.5 V
Brady Statistic RV Percent Paced: 1 % — CL
Date Time Interrogation Session: 20160615093923
Lead Channel Impedance Value: 440 Ohm
Lead Channel Pacing Threshold Amplitude: 3.5 V
Lead Channel Pacing Threshold Pulse Width: 1 ms
Lead Channel Sensing Intrinsic Amplitude: 7.1 mV
Lead Channel Setting Sensing Sensitivity: 0.3 mV
MDC IDC SET LEADCHNL RV PACING AMPLITUDE: 6 V
MDC IDC SET LEADCHNL RV PACING PULSEWIDTH: 1 ms
MDC IDC SET ZONE DETECTION INTERVAL: 300 ms
Pulse Gen Serial Number: 317209

## 2014-08-26 NOTE — Progress Notes (Signed)
ICD check in clinic. Normal device function. Threshold and sensing consistent with previous device measurements. Impedance trends stable over time. No evidence of any ventricular arrhythmias. No changes made this session. Device programmed at appropriate safety margins. Device programmed to optimize intrinsic conduction. Remaining power 2.5V, ERI=2.45V. Battery check 10/26/14, next billable Sept.

## 2014-09-14 ENCOUNTER — Ambulatory Visit (INDEPENDENT_AMBULATORY_CARE_PROVIDER_SITE_OTHER): Payer: 59 | Admitting: Physician Assistant

## 2014-09-14 VITALS — BP 118/76 | HR 57 | Temp 97.9°F | Resp 16 | Ht 68.0 in | Wt 170.0 lb

## 2014-09-14 DIAGNOSIS — R748 Abnormal levels of other serum enzymes: Secondary | ICD-10-CM

## 2014-09-14 DIAGNOSIS — E785 Hyperlipidemia, unspecified: Secondary | ICD-10-CM | POA: Diagnosis not present

## 2014-09-14 NOTE — Progress Notes (Signed)
[facility] [address] [place] [address] [phone]  Date:  09/14/2014   Name:  Chad [name]   DOB:  09-08-1970   MRN:  [id number]  PCP:  Lucilla Edin, MD    History of Present Illness:  Chad [name] is a 44 y.o. male with PMH of hyperlipidemia and elevated liver enzymes who presents to [facility] for follow-up of his cholesterol. He was seen about 3 months ago where his LDL was elevated at 126, and AST/ALT 41/58 elevated.  He was concerned that he needed to follow-up on this and have it rechecked. He currently has not made any lifestyle modifications. He is not exercising, and has no dietary restrictions.  He continues to take his atorvastatin at 20 mg once per day.  He denies chest pains, palpitations, or leg swelling.   Patient Active Problem List   Diagnosis Date Noted  . Hyperlipidemia 06/17/2014  . DVT of axillary vein, chronic left 04/15/2014  . Anxiety 04/15/2014  . Essential tremor 09/19/2012  . Brugada syndrome 09/01/2012  . Dyslipidemia 09/13/2010  . Automatic implantable cardioverter-defibrillator in situ 07/27/2009  . OTHER SPECIFIED CONGENITAL ANOMALY HEART OTHER 02/01/2009  . SYNCOPE 02/01/2009    Past Medical History  Diagnosis Date  . Syncope     s/p ICD 12/07  . Brugada syndrome     s/p ICD 12/07  . GERD (gastroesophageal reflux disease)   . Hyperlipidemia     Past Surgical History  Procedure Laterality Date  . Cardiac defibrillator placement  02/2006    St. Jude Atlas  . Back surgery  1997    History  Substance Use Topics  . Smoking status: Former Smoker    Quit date: 03/14/2011  . Smokeless tobacco: Not on file  . Alcohol Use: Yes     Comment: 6-7 drinks on weekends    Family History  Problem Relation Age of Onset  . Hyperlipidemia Mother   . Heart disease Mother   . Diabetes Mother     No Known Allergies  Medication list has been reviewed and updated.  Current Outpatient Prescriptions on File Prior to Visit   Medication Sig Dispense Refill  . acetaminophen (TYLENOL) 325 MG tablet Take 650 mg by mouth 2 (two) times daily as needed (pain).    Marland Kitchen atorvastatin (LIPITOR) 20 MG tablet Take 1 tablet (20 mg total) by mouth daily. 30 tablet 6  . folic acid (FOLVITE) 1 MG tablet Take 1 tablet (1 mg total) by mouth daily. (Patient taking differently: Take 1 mg by mouth as needed. ) 30 tablet 6  . omeprazole (PRILOSEC) 20 MG capsule Take 1 capsule (20 mg total) by mouth daily as needed (heartburn). (Patient not taking: Reported on 08/26/2014) 30 capsule 3  . rivaroxaban (XARELTO) 20 MG TABS tablet Take 1 tablet (20 mg total) by mouth daily with supper. (Patient not taking: Reported on 08/26/2014) 30 tablet 1   No current facility-administered medications on file prior to visit.    ROS ROS otherwise unremarkable unless listed above.  Physical Examination: BP 118/76 mmHg  Pulse 57  Temp(Src) 97.9 F (36.6 C) (Oral)  Resp 16  Ht  (1.727 m)  Wt 170 lb (77.111 kg)  BMI 25.85 kg/m2  SpO2 98% Ideal Body Weight: Weight in (lb) to have BMI = 25: 164.1 Wt Readings from Last 3 Encounters:  09/14/14 170 lb (77.111 kg)  06/17/14 170 lb (77.111 kg)  04/15/14 170 lb 12.8 oz (77.474 kg)  Physical Exam  Constitutional: He is oriented to person, place, and time. He appears well-developed and well-nourished. No distress.  Eyes: Pupils are equal, round, and reactive to light.  Cardiovascular: Exam reveals no friction rub.   No murmur heard. Pulmonary/Chest: Effort normal. No respiratory distress. He has no wheezes.  Neurological: He is alert and oriented to person, place, and time.  Skin: Skin is warm and dry. He is not diaphoretic.  Psychiatric: He has a normal mood and affect. His behavior is normal.     Assessment and Plan: 44 year old male with PMH listed above is here today to discuss labs and to have retest.  At this time his labs are not significant for recheck at this time.  Specifically without  lifestyle mods.  This mild AST/ALT elevation with LDL elevation, is likely fatty liver disease, without other symptoms or abnormalities.  The liver enzymes improved from previous labs (8 months from recent test).  I have advised him to continue the atorvastatin.  He should increase his exercise regimen which is none at all at this time.  Also discussed appropriate foods at this time.  He will return in 6 months to 1 year for recheck.  Hyperlipidemia  Elevated liver enzymes  Trena Platt, PA-C [facility] 09/14/2014 12:58 PM

## 2014-09-14 NOTE — Patient Instructions (Signed)
You can return in 6 months to a year for your liver and lipid to be rechecked.  They are not significant, but I do want you to exercise 4x per week or more for at least 30 minutes of constant movement to get the heart pumping.  Avoid fried fatty foods, and fake sugars such as artificial fruit juices, sweet teas, and desserts. Weight loss will help your LDL, though the LDL is not horrible.  As well as weight loss will be helpful to the liver enzymes (AST and ALT) too.  You want to also monitor alcohol intake.   No more than 1 drink...12 oz of beer, or 5 oz of wine.

## 2014-09-15 ENCOUNTER — Encounter: Payer: Self-pay | Admitting: Internal Medicine

## 2014-09-16 ENCOUNTER — Encounter: Payer: Self-pay | Admitting: Physician Assistant

## 2014-10-26 ENCOUNTER — Ambulatory Visit (INDEPENDENT_AMBULATORY_CARE_PROVIDER_SITE_OTHER): Payer: 59 | Admitting: *Deleted

## 2014-10-26 DIAGNOSIS — I498 Other specified cardiac arrhythmias: Secondary | ICD-10-CM

## 2014-10-26 DIAGNOSIS — Q248 Other specified congenital malformations of heart: Secondary | ICD-10-CM

## 2014-10-26 LAB — CUP PACEART INCLINIC DEVICE CHECK
Battery Voltage: 2.46 V
Brady Statistic RV Percent Paced: 1 %
Date Time Interrogation Session: 20160815100232
Lead Channel Setting Pacing Amplitude: 6 V
Lead Channel Setting Pacing Pulse Width: 1 ms
MDC IDC MSMT LEADCHNL RV IMPEDANCE VALUE: 435 Ohm
MDC IDC MSMT LEADCHNL RV SENSING INTR AMPL: 9.8 mV
MDC IDC SET LEADCHNL RV SENSING SENSITIVITY: 0.3 mV
MDC IDC SET ZONE DETECTION INTERVAL: 300 ms
Pulse Gen Serial Number: 317209

## 2014-10-26 NOTE — Progress Notes (Signed)
ICD check in clinic for batt only (N/C). No evidence of any ventricular arrhythmias. No changes made this session. Batt voltage 2.46V (ERI 2.45V). Plan to follow up with the Device Clinic on 10/17 @ 0900 (patient prefers Q64mos) and with GT in 04-2015.

## 2014-11-13 ENCOUNTER — Encounter: Payer: Self-pay | Admitting: Internal Medicine

## 2014-11-25 ENCOUNTER — Ambulatory Visit (INDEPENDENT_AMBULATORY_CARE_PROVIDER_SITE_OTHER): Payer: 59 | Admitting: Urgent Care

## 2014-11-25 VITALS — BP 124/84 | HR 76 | Temp 97.7°F | Resp 16 | Ht 67.0 in | Wt 169.0 lb

## 2014-11-25 DIAGNOSIS — J029 Acute pharyngitis, unspecified: Secondary | ICD-10-CM | POA: Diagnosis not present

## 2014-11-25 DIAGNOSIS — R05 Cough: Secondary | ICD-10-CM | POA: Diagnosis not present

## 2014-11-25 DIAGNOSIS — J302 Other seasonal allergic rhinitis: Secondary | ICD-10-CM

## 2014-11-25 DIAGNOSIS — R059 Cough, unspecified: Secondary | ICD-10-CM

## 2014-11-25 DIAGNOSIS — B349 Viral infection, unspecified: Secondary | ICD-10-CM

## 2014-11-25 MED ORDER — BENZONATATE 100 MG PO CAPS: 100.0000 mg | ORAL_CAPSULE | Freq: Three times a day (TID) | ORAL | Status: DC | PRN

## 2014-11-25 MED ORDER — PSEUDOEPHEDRINE HCL ER 120 MG PO TB12: 120.0000 mg | ORAL_TABLET | Freq: Two times a day (BID) | ORAL | Status: DC

## 2014-11-25 MED ORDER — LEVOCETIRIZINE DIHYDROCHLORIDE 5 MG PO TABS: 5.0000 mg | ORAL_TABLET | Freq: Every evening | ORAL | Status: DC

## 2014-11-25 MED ORDER — HYDROCODONE-HOMATROPINE 5-1.5 MG/5ML PO SYRP: 5.0000 mL | ORAL_SOLUTION | Freq: Every evening | ORAL | Status: DC | PRN

## 2014-11-25 NOTE — Patient Instructions (Signed)
Nhi?m Trùng ???ng Hô H?p Trên, Ng??i L?n °(Upper Respiratory Infection, Adult) °Nhi?m trùng ???ng hô h?p trên (URI) ?ôi khi còn ???c g?i là c?m l?nh thông th??ng. ???ng hô h?p trên bao g?m m?i, xoang, h?ng, khí qu?n và ph? qu?n. Ph? qu?n là ???ng hô h?p d?n ??n ph?i. H?u h?t m?i ng??i c?i thi?n trong vòng 1 tu?n, nh?ng các tri?u ch?ng có th? kéo dài ??n 2 tu?n. Ho còn l?i có th? kéo dài th?m chí lâu h?n.  °NGUYÊN NHÂN °Nhi?u vi rút khác nhau có th? lây nhi?m các mô lót ???ng hô h?p trên. Các mô này tr? nên b? kích thích, viêm và th??ng tr? nên r?t ?m ??t. S? s?n sinh ch?t nh?n c?ng là ph? bi?n. C?m l?nh d? lây. B?n có th? d? dàng lây vi rút cho ng??i khác khi ti?p xúc b?ng mi?ng. ?i?u này bao g?m hôn, dùng chung ly, ho ho?c h?t h?i. Ch?m vào mi?ng ho?c m?i b?n và sau ?ó ch?m vào m?t b? m?t, sau ?ó ng??i khác ch?m vào b? m?t này c?ng có th? b? lây vi rút. °TRI?U CH?NG °Các tri?u ch?ng th??ng phát tri?n 1-3 ngày sau khi b?n ti?p xúc v?i vi rút c?m l?nh. Tri?u ch?ng ? m?i ng??i m?t khác. Các tri?u ch?ng có th? bao g?m: °· Ch?y n??c m?i. °· H?t h?i. °· Ngh?t m?i. °· Kích thích xoang. °· ?au h?ng. °· M?t ti?ng (viêm thanh qu?n). °· Ho. °· M?t m?i. °· ?au nh?c c?. °· ?n không ngon mi?ng. °· ?au ??u. °· S?t nh?. °CH?N ?OÁN °B?n có th? ch?n ?oán b?nh c?m l?nh c?a chính mình d?a vào nh?ng tri?u ch?ng quen thu?c, vì h?u h?t m?i ng??i b? c?m l?nh 2-3 l?n m?i n?m. Chuyên gia ch?m sóc s?c kh?e c?a b?n có th? xác nh?n ?i?u này sau khi khám b?n. Quan tr?ng nh?t, chuyên gia ch?m sóc y t? c?a b?n có th? ki?m tra và ??m b?o r?ng các tri?u ch?ng c?a b?n không ph?i do m?t b?nh khác nh? viêm h?ng, viêm xoang, viêm ph?i, hen suy?n ho?c viêm ti?u thi?t. Không c?n thi?t xét nghi?m máu, ki?m tra h?ng và ch?p X-quang ?? ch?n ?oán c?m l?nh thông th??ng, nh?ng ?ôi khi chúng có th? h?u ích trong vi?c lo?i tr? các b?nh khác nghiêm tr?ng h?n. Chuyên gia ch?m sóc s?c kh?e c?a b?n s? quy?t ??nh có c?n thêm các xét nghi?m khác không. °R?I RO VÀ BI?N  CH?NG °B?n có th? có nguy c? b? m?t tr??ng h?p nghiêm tr?ng h?n c?m l?nh thông th??ng n?u b?n hút thu?c lá, có b?nh tim m?n tính (nh? suy tim), ho?c b?nh ph?i (nh? hen suy?n), ho?c n?u b?n có h? th?ng mi?n d?ch suy y?u. Nh?ng ng??i r?t tr? và r?t già c?ng có nguy c? nhi?m trùng nghiêm tr?ng h?n. Viêm xoang do vi khu?n, nhi?m trùng tai gi?a, viêm ph?i do vi khu?n có th? khi?n c?m l?nh thông th??ng ph?c t?p thêm. C?m l?nh thông th??ng có th? làm tr?m tr?ng thêm b?nh hen suy?n và t?c ngh?n ph?i m?n tính (COPD). ?ôi khi, nh?ng bi?n ch?ng này có th? yêu c?u ch?m sóc y t? kh?n c?p và có th? ?e d?a tính m?ng. °PHÒNG NG?A °Cách t?t nh?t ?? b?o v? tránh c?m l?nh là th?c hành v? sinh t?t. Tránh ti?p xúc b?ng mi?ng ho?c b?ng tay v?i nh?ng ng??i có tri?u ch?ng c?m l?nh. R?a tay th??ng xuyên n?u ti?p xúc x?y ra. Không có b?ng ch?ng rõ ràng r?ng   vitamin C, vitamin E, Echinacea ho?c t?p th? d?c làm gi?m kh? n?ng phát tri?n c?m l?nh. Tuy nhiên, b?n luôn nên ngh? ng?i nhi?u và có ch? ?? dinh d??ng t?t. °?I?U TR? °?i?u tr? nh?m gi?m tri?u ch?ng. Không có thu?c ch?a. Kháng sinh không hi?u qu?, vì nhi?m trùng do vi rút gây ra, ch? không ph?i do vi khu?n. ?i?u tr? có th? bao g?m: °· U?ng nhi?u n??c h?n. ?? u?ng th? thao cung c?p ch?t ?i?n gi?i, ???ng và các ch?t l?ng có giá tr?. °· Th? s??ng mù n??c nóng ho?c h?i n??c (bình phun h?i ho?c vòi sen). °· ?n súp gà ho?c n??c canh trong khác và duy trì ch? ?? dinh d??ng t?t. °· Ngh? ng?i th?t nhi?u. °· S? d?ng thu?c súc mi?ng ho?c thu?c u?ng ?? t?o c?m giác tho?i mái. °· Ki?m soát s?t b?ng ibuprofen ho?c acetaminophen theo ch? d?n c?a chuyên gia ch?m sóc s?c kh?e. °· T?ng c??ng s? d?ng thu?c d?ng hít n?u b?n b? b?nh hen suy?n. °S? d?ng gel k?m và k?o dòn k?m trong 24 gi? ??u tiên sau khi b? c?m l?nh thông th??ng có th? rút ng?n th?i gian và làm gi?m b?t m?c ?? nghiêm tr?ng c?a các tri?u ch?ng. Thu?c gi?m ?au có th? có tác d?ng v?i s?t, ?au nh?c c? b?p và ?au c? h?ng. Nhi?u lo?i thu?c không  c?n kê ??n có s?n ?? ?i?u tr? t?c ngh?n và ch?y n??c m?i. Chuyên gia ch?m sóc s?c kh?e c?a b?n có th? ??a ra các khuy?n cáo và có th? ?? ngh? s? d?ng thu?c x?t m?i ho?c ph?i cho các tri?u ch?ng khác. °H??NG D?N CH?M SÓC T?I NHÀ °· Ch? s? d?ng thu?c không c?n kê toa ho?c thu?c c?n kê toa ?? gi?m ?au, gi?m c?m giác khó ch?u ho?c h? s?t theo ch? d?n c?a chuyên gia ch?m sóc s?c kh?e c?a b?n. °· S? d?ng máy t?o ?? ?m s??ng mù ?m ho?c hít h?i n??c t? m?t vòi sen ?? t?ng ?? ?m không khí. ?i?u này có th? ti?t ?m và giúp d? th? h?n. °· U?ng ?? n??c và dung d?ch ?? n??c ti?u trong ho?c có màu vàng nh?t. °· Ngh? ng?i theo nhu c?u. °· Tr? l?i làm vi?c khi nhi?t ?? c?a b?n ?ã tr? l?i bình th??ng ho?c chuyên gia ch?m sóc y t? khuyên b?n làm nh? v?y. B?n có th? c?n ? nhà lâu h?n ?? tránh lây nhi?m cho ng??i khác. B?n c?ng có th? s? d?ng m?t n? và r?a tay c?n th?n ?? ng?n ng?a lây lan vi rút. °HÃY ?I KHÁM N?U: °· Sau vài ngày ??u tiên, b?n c?m th?y t? h?n ch? không ph?i là t?t h?n. °· B?n c?n l?i khuyên c?a chuyên gia ch?m sóc s?c kh?e v? thu?c ?? ki?m soát các tri?u ch?ng. °· B?n b? ?n l?nh, khó th? tr?m tr?ng h?n ho?c ??m màu nâu ho?c ??. ?ây có th? là nh?ng d?u hi?u c?a viêm ph?i. °· B?n b? ch?y n??c m?i màu vàng ho?c nâu ho?c ?au ? m?t, ??c bi?t là khi b?n u?n cong v? phía tr??c. ?ây có th? là nh?ng d?u hi?u c?a viêm xoang. °· B?n b? s?t, s?ng h?ch c?, ?au khi nu?t ho?c các vùng màu tr?ng ? m?t sau c?a c? h?ng. ?ây có th? là nh?ng d?u hi?u c?a viêm h?ng. °HÃY NGAY L?P T?C ?I KHÁM N?U: °· B?n b? s?t. °· B?n b? nh?c ??u d? d?i ho?c dai d?ng, ?au tai, ?au   xoang ho?c ?au ng?c. °· B?n b? th? khò khè, ho kéo dài, ho ra máu ho?c có s? thay ??i trong ch?t nh?n thông th??ng c?a b?n (n?u b?n b? b?nh ph?i m?n tính). °· B?n b? ?au c? ho?c c? c?ng. °Document Released: 09/12/2010 Document Revised: 10/30/2012 °ExitCare® Patient Information ©2015 [employer]. This information is not intended to replace advice given to you by your health care  provider. Make sure you discuss any questions you have with your health care provider. ° °

## 2014-11-25 NOTE — Progress Notes (Signed)
    MRN: [id number] DOB: [birth year]  Subjective:   Chad [name] is a 44 y.o. male presenting for chief complaint of Sinusitis; Cough; and Fatigue  Reports 3 day history of rhinorrhea, productive cough without hemoptysis, sore throat, shob elicitied with cough, fatigue. Has tried Tylenol, otc oral medication for allergy x1 with minimal relief. Denies fever, itchy or watery red eyes, ear pain, ear drainage, sinus pain, tooth pain, chest pain, wheezing, n/v, abdominal pain, myalgia. Admits history of seasonal allergies, does not take anything for this. Denies smoking cigarettes, ~6 pack on the weekends. Denies any other aggravating or relieving factors, no other questions or concerns.  Viral is currently taking APAP, atorvastatin, folic acid, omeprazole. Also has No Known Allergies.  Emitt  has a past medical history of Syncope; Brugada syndrome; GERD (gastroesophageal reflux disease); and Hyperlipidemia. Also  has past surgical history that includes Cardiac defibrillator placement (02/2006) and Back surgery (1997).  Objective:   Vitals: BP 124/84 mmHg  Pulse 76  Temp(Src) 97.7 F (36.5 C) (Oral)  Resp 16  Ht 5\' 7"  (1.702 m)  Wt 169 lb (76.658 kg)  BMI 26.46 kg/m2  SpO2 98%  Physical Exam  Constitutional: He is oriented to person, place, and time. He appears well-developed and well-nourished.  HENT:  TM's intact bilaterally, no effusions or erythema. Nares patent, nasal turbinates boggy and slightly edematous, nasal passages patent. No sinus tenderness. Oropharynx with postnasal drainage but no tonsillar exudates, erythema or abscesses; mucous membranes moist, dentition in good repair.  Cardiovascular: Normal rate, regular rhythm and intact distal pulses.  Exam reveals no gallop and no friction rub.   No murmur heard. Pulmonary/Chest: No respiratory distress. He has no wheezes. He has no rales.  Neurological: He is alert and oriented to person, place, and time.  Skin: Skin is warm and dry.  No rash noted. No erythema. No pallor.   Assessment and Plan :   1. Viral syndrome 2. Cough 3. Sore throat 4. Seasonal allergies - Likely undergoing viral syndrome worsened by uncontrolled allergies. Offered supportive care for now and recommended better control of his allergies. Follow up in 1 week, if no improvement consider antibiotic course or reevaluation depending on symptoms. Hycodan for cough, Tessalon for day time cough, Sudafed for postnasal drainage. - levocetirizine (XYZAL) 5 MG tablet; Take 1 tablet (5 mg total) by mouth every evening.  Dispense: 30 tablet; Refill: 11   Wallis Bamberg, PA-C [facility] [phone] 11/25/2014 10:56 AM

## 2014-12-15 ENCOUNTER — Encounter: Payer: Self-pay | Admitting: Emergency Medicine

## 2014-12-29 ENCOUNTER — Encounter: Payer: Self-pay | Admitting: Internal Medicine

## 2015-03-14 ENCOUNTER — Ambulatory Visit (INDEPENDENT_AMBULATORY_CARE_PROVIDER_SITE_OTHER): Payer: BLUE CROSS/BLUE SHIELD | Admitting: Urgent Care

## 2015-03-14 DIAGNOSIS — M79621 Pain in right upper arm: Secondary | ICD-10-CM | POA: Diagnosis not present

## 2015-03-14 DIAGNOSIS — K219 Gastro-esophageal reflux disease without esophagitis: Secondary | ICD-10-CM

## 2015-03-14 DIAGNOSIS — E785 Hyperlipidemia, unspecified: Secondary | ICD-10-CM

## 2015-03-14 DIAGNOSIS — M25521 Pain in right elbow: Secondary | ICD-10-CM

## 2015-03-14 LAB — LIPID PANEL
Cholesterol: 202 mg/dL — ABNORMAL HIGH (ref 125–200)
HDL: 58 mg/dL (ref >=40)
LDL Cholesterol: 111 mg/dL (ref <130)
Total CHOL/HDL Ratio: 3.5 Ratio (ref <=5.0)
Triglycerides: 165 mg/dL — ABNORMAL HIGH (ref <150)
VLDL: 33 mg/dL — ABNORMAL HIGH (ref <30)

## 2015-03-14 LAB — COMPREHENSIVE METABOLIC PANEL
ALT: 42 U/L (ref 9–46)
AST: 27 U/L (ref 10–40)
Albumin: 4.6 g/dL (ref 3.6–5.1)
Alkaline Phosphatase: 50 U/L (ref 40–115)
BUN: 15 mg/dL (ref 7–25)
CHLORIDE: 101 mmol/L (ref 98–110)
CO2: 25 mmol/L (ref 20–31)
CREATININE: 0.81 mg/dL (ref 0.60–1.35)
Calcium: 9.6 mg/dL (ref 8.6–10.3)
GLUCOSE: 103 mg/dL — AB (ref 65–99)
POTASSIUM: 3.9 mmol/L (ref 3.5–5.3)
SODIUM: 138 mmol/L (ref 135–146)
Total Bilirubin: 0.8 mg/dL (ref 0.2–1.2)
Total Protein: 7.5 g/dL (ref 6.1–8.1)

## 2015-03-14 MED ORDER — AMOXICILLIN-POT CLAVULANATE 875-125 MG PO TABS: 1.0000 | ORAL_TABLET | Freq: Two times a day (BID) | ORAL | Status: DC

## 2015-03-14 MED ORDER — KETOROLAC TROMETHAMINE 60 MG/2ML IM SOLN
60.0000 mg | Freq: Once | INTRAMUSCULAR | Status: AC
  Administered 2015-03-14: 60 mg via INTRAMUSCULAR

## 2015-03-14 MED ORDER — OMEPRAZOLE 20 MG PO CPDR
20.0000 mg | DELAYED_RELEASE_CAPSULE | Freq: Every day | ORAL | Status: DC | PRN
Start: 2015-03-14 — End: 2016-01-11
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
## 2015-03-15 ENCOUNTER — Other Ambulatory Visit: Payer: Self-pay | Admitting: Urgent Care

## 2015-03-15 DIAGNOSIS — E785 Hyperlipidemia, unspecified: Secondary | ICD-10-CM

## 2015-03-15 MED ORDER — ATORVASTATIN CALCIUM 20 MG PO TABS: 20.0000 mg | ORAL_TABLET | Freq: Every day | ORAL | Status: DC

## 2015-04-06 ENCOUNTER — Ambulatory Visit (INDEPENDENT_AMBULATORY_CARE_PROVIDER_SITE_OTHER): Payer: BLUE CROSS/BLUE SHIELD | Admitting: *Deleted

## 2015-04-06 DIAGNOSIS — Z23 Encounter for immunization: Secondary | ICD-10-CM | POA: Diagnosis not present

## 2015-05-12 ENCOUNTER — Encounter: Payer: Self-pay | Admitting: *Deleted

## 2015-06-21 ENCOUNTER — Encounter: Payer: Self-pay | Admitting: Internal Medicine

## 2015-06-30 ENCOUNTER — Other Ambulatory Visit: Payer: Self-pay | Admitting: Internal Medicine

## 2015-06-30 NOTE — Telephone Encounter (Signed)
Rx request sent to pharmacy.  

## 2015-08-04 ENCOUNTER — Encounter: Payer: Self-pay | Admitting: Internal Medicine

## 2015-08-05 ENCOUNTER — Encounter: Payer: Self-pay | Admitting: Internal Medicine

## 2015-08-05 ENCOUNTER — Telehealth: Payer: Self-pay | Admitting: Internal Medicine

## 2015-10-07 ENCOUNTER — Other Ambulatory Visit: Payer: Self-pay | Admitting: Internal Medicine

## 2015-10-07 NOTE — Telephone Encounter (Signed)
Would you like to refill this medication? Please advise °

## 2015-10-07 NOTE — Telephone Encounter (Signed)
Ok to fill 

## 2015-11-29 ENCOUNTER — Other Ambulatory Visit: Payer: Self-pay | Admitting: Internal Medicine

## 2016-01-11 ENCOUNTER — Ambulatory Visit (INDEPENDENT_AMBULATORY_CARE_PROVIDER_SITE_OTHER): Payer: BLUE CROSS/BLUE SHIELD | Admitting: Physician Assistant

## 2016-01-11 VITALS — BP 122/72 | HR 70 | Temp 98.3°F | Resp 17 | Ht 68.5 in | Wt 171.0 lb

## 2016-01-11 DIAGNOSIS — Z202 Contact with and (suspected) exposure to infections with a predominantly sexual mode of transmission: Secondary | ICD-10-CM

## 2016-01-11 DIAGNOSIS — Z0000 Encounter for general adult medical examination without abnormal findings: Secondary | ICD-10-CM | POA: Diagnosis not present

## 2016-01-11 DIAGNOSIS — K5900 Constipation, unspecified: Secondary | ICD-10-CM

## 2016-01-11 DIAGNOSIS — Z95810 Presence of automatic (implantable) cardiac defibrillator: Secondary | ICD-10-CM

## 2016-01-11 DIAGNOSIS — Z114 Encounter for screening for human immunodeficiency virus [HIV]: Secondary | ICD-10-CM | POA: Diagnosis not present

## 2016-01-11 DIAGNOSIS — Z23 Encounter for immunization: Secondary | ICD-10-CM | POA: Diagnosis not present

## 2016-01-11 DIAGNOSIS — I498 Other specified cardiac arrhythmias: Secondary | ICD-10-CM | POA: Diagnosis not present

## 2016-01-11 DIAGNOSIS — K219 Gastro-esophageal reflux disease without esophagitis: Secondary | ICD-10-CM | POA: Diagnosis not present

## 2016-01-11 DIAGNOSIS — E785 Hyperlipidemia, unspecified: Secondary | ICD-10-CM | POA: Diagnosis not present

## 2016-01-11 LAB — COMPLETE METABOLIC PANEL WITH GFR
AST: 26 U/L (ref 10–40)
Albumin: 4.6 g/dL (ref 3.6–5.1)
Alkaline Phosphatase: 50 U/L (ref 40–115)
BUN: 12 mg/dL (ref 7–25)
Calcium: 9.5 mg/dL (ref 8.6–10.3)
Chloride: 103 mmol/L (ref 98–110)
GFR, Est African American: >89 mL/min (ref >=60)
GFR, Est Non African American: >89 mL/min (ref >=60)
Glucose, Bld: 105 mg/dL — ABNORMAL HIGH (ref 65–99)
Sodium: 138 mmol/L (ref 135–146)
Total Bilirubin: 0.6 mg/dL (ref 0.2–1.2)
Total Protein: 7 g/dL (ref 6.1–8.1)

## 2016-01-11 LAB — POC MICROSCOPIC URINALYSIS (UMFC): Mucus: ABSENT

## 2016-01-11 LAB — POCT URINALYSIS DIP (MANUAL ENTRY)
Bilirubin, UA: NEGATIVE
Blood, UA: NEGATIVE
Glucose, UA: NEGATIVE
Ketones, POC UA: NEGATIVE
Leukocytes, UA: NEGATIVE
Nitrite, UA: NEGATIVE
Protein Ur, POC: NEGATIVE
Spec Grav, UA: 1.015
Urobilinogen, UA: 0.2
pH, UA: 5.5

## 2016-01-11 LAB — COMPLETE METABOLIC PANEL WITHOUT GFR
ALT: 42 U/L (ref 9–46)
CO2: 26 mmol/L (ref 20–31)
Creat: 0.83 mg/dL (ref 0.60–1.35)
Potassium: 4.3 mmol/L (ref 3.5–5.3)

## 2016-01-11 LAB — CBC
HCT: 44.1 % (ref 38.5–50.0)
Hemoglobin: 14.6 g/dL (ref 13.2–17.1)
MCH: 31.1 pg (ref 27.0–33.0)
MCHC: 33.1 g/dL (ref 32.0–36.0)
MCV: 94 fL (ref 80.0–100.0)
MPV: 10 fL (ref 7.5–12.5)
Platelets: 332 10*3/uL (ref 140–400)
RBC: 4.69 MIL/uL (ref 4.20–5.80)
RDW: 12.9 % (ref 11.0–15.0)
WBC: 9 10*3/uL (ref 3.8–10.8)

## 2016-01-11 LAB — LIPID PANEL
Cholesterol: 174 mg/dL (ref 125–200)
HDL: 47 mg/dL (ref >=40)
LDL Cholesterol: 83 mg/dL (ref <130)
Total CHOL/HDL Ratio: 3.7 ratio (ref <=5.0)
Triglycerides: 222 mg/dL — ABNORMAL HIGH (ref <150)
VLDL: 44 mg/dL — ABNORMAL HIGH (ref <30)

## 2016-01-11 MED ORDER — OMEPRAZOLE 20 MG PO CPDR: 20.0000 mg | DELAYED_RELEASE_CAPSULE | Freq: Every day | ORAL | 6 refills | Status: DC | PRN

## 2016-01-11 NOTE — Progress Notes (Signed)
Chad [name]  MRN: [id number] DOB: [birth year]  PCP: Lucilla Edin, MD  Subjective:  Pt is a 45 year old male, history of HLD, GERD, anemia, history of Brugada syndrome (2007), internal defibrillator placed in 2007, history of DVT in upper extremety (2015), who presents to clinic for check-up. Feeling well today. 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   Complaints: Would like refill of Folic acid, as he takes this for his heart. Would like refill of Omeprazole.  Reports in the past his liver enzymes were elevated and is interested in having them checked today.    History of Brugada syndrome and internal defibrillator - Checked by Dr. Tanda Rockers. Hasn't had a battery check in over a year. He missed his appointment five months ago. His next appointment is Dec. 19, 2017. Denies chest pain, palpitations, light-headedness, SOB, syncope.   History of Hyperlipidemia - Takes Lipitor 20mg . Last lipid panel drawn 03/2015.   History of GERD - Describes an occasional "full belly" after he eats. Feels better with Omeprazole. Denies symptoms of regurgitation, burning after he eats.   Possible exposure to STD - two regular sexual partners. No condoms. Would like screening for STDs today.  Marital status: Married  Diet - Rice, pork, meat. Not a lot of vegetables or fruit. Drinks 1 L water/day. Alcohol - Drinks 6-7 beers 3-4x/week Smoking- Former smoker, quit 2013 Exercise - does not exercise Family history - Mother Diabetes, Hd, HLD  Sleeping well - wakes up 1x/night to urinate  Bowel movements every three or four days. Describes them as sometimes watery, sometimes formed little balls. After a night of drinking 6-7 beers, he will have three bowel movements in one day.     Review of Systems  Constitutional: Negative for activity change, appetite change, fatigue and unexpected weight change.  HENT: Negative for congestion, dental problem, sneezing and tinnitus.   Eyes: Negative for  visual disturbance.  Respiratory: Negative for cough, chest tightness, shortness of breath and wheezing.   Cardiovascular: Negative for chest pain, palpitations and leg swelling.  Gastrointestinal: Negative for abdominal pain, blood in stool, constipation, diarrhea, nausea and vomiting.  Endocrine: Negative for polydipsia, polyphagia and polyuria.  Genitourinary: Negative for decreased urine volume, difficulty urinating, discharge, hematuria, scrotal swelling and testicular pain.  Musculoskeletal: Negative for arthralgias, back pain and neck stiffness.  Allergic/Immunologic: Positive for environmental allergies. Negative for food allergies.  Neurological: Negative for dizziness, syncope, weakness, light-headedness and headaches.  Psychiatric/Behavioral: Negative for sleep disturbance. The patient is not nervous/anxious.     Patient Active Problem List   Diagnosis Date Noted  . Seasonal allergies 11/25/2014  . Viral syndrome 11/25/2014  . Hyperlipidemia 06/17/2014  . DVT of axillary vein, chronic left (HCC) 04/15/2014  . Anxiety 04/15/2014  . Essential tremor 09/19/2012  . Brugada syndrome 09/01/2012  . Dyslipidemia 09/13/2010  . Automatic implantable cardioverter-defibrillator in situ 07/27/2009  . OTHER SPECIFIED CONGENITAL ANOMALY HEART OTHER 02/01/2009  . SYNCOPE 02/01/2009    Current Outpatient Prescriptions on File Prior to Visit  Medication Sig Dispense Refill  . atorvastatin (LIPITOR) 20 MG tablet TAKE 1 TABLET BY MOUTH DAILY 30 tablet 1  . folic acid (FOLVITE) 1 MG tablet TAKE 1 TABLET BY MOUTH DAILY 15 tablet 0  . omeprazole (PRILOSEC) 20 MG capsule Take 1 capsule (20 mg total) by mouth daily as needed (heartburn). 30 capsule 6   No current facility-administered medications on file prior to visit.     No Known  Allergies  Objective:  BP 122/72 (BP Location: Right Arm, Patient Position: Sitting, Cuff Size: Normal)   Pulse 70   Temp 98.3 F (36.8 C) (Oral)   Resp 17    Ht 5' 8.5" (1.74 m)   Wt 171 lb (77.6 kg)   SpO2 96%   BMI 25.62 kg/m   Physical Exam  Constitutional: He is oriented to person, place, and time and well-developed, well-nourished, and in no distress. No distress.  HENT:  Head: Normocephalic and atraumatic.  Right Ear: External ear normal.  Left Ear: External ear normal.  Nose: Nose normal.  Mouth/Throat: Oropharynx is clear and moist. No oropharyngeal exudate.  Eyes: Conjunctivae and EOM are normal. Pupils are equal, round, and reactive to light. Right eye exhibits no discharge. No scleral icterus.  Neck: Normal range of motion. Neck supple. No tracheal deviation present. No thyromegaly present.  Cardiovascular: Normal rate, regular rhythm and intact distal pulses.   No murmur heard. Pulmonary/Chest: Effort normal and breath sounds normal. No respiratory distress. He has no wheezes.  Abdominal: Soft. Bowel sounds are normal. He exhibits no distension and no mass. There is no tenderness.  Genitourinary: He exhibits no abnormal testicular mass, no abnormal scrotal mass and no epididymal tenderness. Penis exhibits no lesions and no edema.  Musculoskeletal: Normal range of motion.  Lymphadenopathy:    He has no cervical adenopathy.  Neurological: He is alert and oriented to person, place, and time. He has normal reflexes. Gait normal. GCS score is 15.  Skin: Skin is warm and dry. He is not diaphoretic.  Psychiatric: Mood, memory, affect and judgment normal.    Assessment and Plan :  1. Annual physical exam - COMPLETE METABOLIC PANEL WITH GFR - CBC - POCT urinalysis dipstick - POCT Microscopic Urinalysis ([facility]) - Discussed with patient history of elevated liver enzymes could be due to his drinking. Encouraged patient to decrease his alcohol intake per week.   2. Hyperlipidemia, unspecified hyperlipidemia type - Lipid panel pending. - Patient does not need a refill of his Lipitor today.  - Encouraged patient to improve his diet  and get regular exercise.   3. Gastroesophageal reflux disease without esophagitis - omeprazole (PRILOSEC) 20 MG capsule; Take 1 capsule (20 mg total) by mouth daily as needed (heartburn).  Dispense: 30 capsule; Refill: 6  4. Constipation, unspecified constipation type - Discussed with patient his bowel movements and feeling of "full belly" suggest constipation. Encouraged patient to incorporate more fiber in his diet, drink more water and get regular exercise. Patient decline medications to help move his bowels. He would like refill of Prilosec as this helps his symptoms. Information about constipation printed off for patient.   5. Screening for HIV (human immunodeficiency virus) 6. Possible exposure to STD - GC/Chlamydia Probe Amp - HIV antibody  7. Automatic implantable cardioverter-defibrillator in situ 8. Brugada syndrome - Encouraged patient to follow-up with cardiologist for interrogation of defibrillator, keep appointment for Dec. 19, as he has not received interrogation in over one year. - CBC pending. Will consider decreasing daily Folic Acid to 161 mcg/day. In reviewing this patient's chart, it appears 1MG  was initiated in 2008 for fatigue, however lab results from that time are not available. Patient states he takes this for his heart, however in reviewing encounter notes, Folic Acid was not initiated until a year after his defibrillator was implanted.  He is currently asymptomatic. Will consider decreasing dose to the recommended daily maintenance dose of . Patient is to follow-up in three  months for evaluation of fatigue and CBC.    9. Flu vaccine need - Flu Vaccine QUAD 36+ mos IM   Marco CollieWhitney Jetaun Colbath, PA-C  [facility] 01/11/2016 11:29 AM

## 2016-01-11 NOTE — Patient Instructions (Addendum)
Thank you for coming in today. I hope you feel we met your needs.  Feel free to call [facility] if you have any questions or further requests.  Please consider signing up for MyChart if you do not already have it, as this is a great way to communicate with me.  Best,  Whitney McVey, PA-C    IF you received an x-ray today, you will receive an invoice from [facility]. Please contact [facility] at [phone] with questions or concerns regarding your invoice.   IF you received labwork today, you will receive an invoice from [employer]. Please contact [facility] at [phone] with questions or concerns regarding your invoice.   Our billing staff will not be able to assist you with questions regarding bills from these companies.  You will be contacted with the lab results as soon as they are available. The fastest way to get your results is to activate your My Chart account. Instructions are located on the last page of this paperwork. If you have not heard from [place] regarding the results in 2 weeks, please contact this office.     To bn, Ng??i l?n (Constipation, Adult) To bn l khi m?t ng??i ?i ??i ti?n t h?n ba l?n trong m?t tu?n, ??i ti?n kh kh?n, ho?c ??i ti?n ra phn kh, c?ng, ho?c to h?n bnh th??ng. Khi tu?i cng cao th cng d? b? to bn. Ch? ?? ?n thi?u ch?t x?, khng u?ng ?? n??c, v dng m?t s? lo?i thu?c nh?t ??nh c th? lm to bo n?ng thm.  NGUYN NHN.   M?t s? lo?i thu?c nh?t ??nh nh? thu?c ch?ng tr?m c?m, thu?c gi?m ?au, thu?c c b? sung s?t, thu?c trung ha axit d?ch v?, v thu?c l?i ti?u.  M?t s? b?nh l nh?t ??nh nh? ti?u ???ng, h?i ch?ng ru?t kch thch (IBS), b?nh c?a tuy?n gip, ho?c tr?m c?m.  Khng u?ng ?? n??c.  Khng ?n ?? th?c ?n giu ch?t x?.  C?ng th?ng ho?c do ?i l?i.  t ho?t ??ng thn th? ho?c th? d?c.  Nh?n ?i ??i ti?n.  S? d?ng qu nhi?u thu?c nhu?n trng. D?U HI?U V TRI?U CH?NG   ?i ??i ti?n t  h?n ba l?n m?i tu?n.  Ph?i r?n m?nh ?? ??i ti?n.  ??i ti?n ra phn c?ng, kh, ho?c to h?n bnh th??ng.  C?m th?y ??y b?ng ho?c ch??ng b?ng.  ?au ? vng b?ng d??i.  Khng c?m th?y tho?i mi sau khi ??i ti?n. CH?N ?ON  Chuyn gia ch?m [place] s?c kh?e c?a qu v? s? h?i v? b?nh s? v khm th?c th? cho qu v?. C th? c?n ki?m tra thm trong tr??ng h?p to bn n?ng. M?t s? ki?m tra c th? bao g?m:  Ch?p X quang c ch?t c?n quang ?? ki?m tra tr?c trng, ??i trng, v ?i khi c? ru?t non c?a qu v?.  N?i soi tr?c trng sigma ?? ki?m tra ph?n pha d??i c?a ??i trng.  Th? thu?t soi ??i trng ?? khm ton b? ??i trng. ?I?U TR?  ?i?u tr? ty thu?c vo m?c ?? tr?m tr?ng c?a ch?ng to bn v nguyn nhn gy to bn. ?i?u tr? thng qua ch? ?? ?n u?ng bao g?m u?ng nhi?u n??c h?n v ?n th?c ?n c nhi?u ch?t x? h?n. ?i?u tr? thng qua l?i s?ng c th? bao g?m vi?c t?p th? d?c th??ng xuyn. N?u nh?ng khuy?n ngh? v? ch? ?? ?n v l?i s?ng khng c tc d?ng,  chuyn gia ch?m [place] s?c kh?e c th? khuy?n ngh? qu v? dng cc lo?i thu?c nhu?n trng khng c?n k ??n ?? gip qu v? ??i ti?n. C th? ph?i k ??n thu?c c?n k ??n n?u thu?c khng c?n k ??n khng c tc d?ng.  H??NG D?N CH?M [place] T?I NH   ?n th?c ?n c nhi?u ch?t x? nh? tri cy, rau, ng? c?c nguyn h?t, v cc lo?i ??u.  H?n ch? th?c ?n c nhi?u ch?t bo v ???ng ch? bi?n s?n, ch?ng h?n khoai ty chin, bnh hamburger, bnh quy, k?o, v soda.  C th? dng th?c ph?m ch?c n?ng c b? sung ch?t x? vo kh?u ph?n ?n c?a qu v? n?u qu v? khng th? dng ?? ch?t x? t? th?c ?n.  U?ng ?? n??c ?? gi? cho n??c ti?u trong ho?c vng nh?t.  T?p th? d?c th??ng xuyn ho?c theo ch? d?n c?a chuyn gia ch?m [place] s?c kh?e.  Vo nh v? sinh ngay khi qu v? c nhu c?u. Khng nh?n ?i ??i ti?n.  Ch? s? d?ng thu?c khng c?n k ??n ho?c thu?c c?n k ??n theo ch? d?n c?a chuyn gia ch?m [place] s?c kh?e.Khng dng cc lo?i thu?c ch?ng to bn khc m khng bn b?c tr??c v?i  chuyn gia ch?m [place] s?c kh?e. NGAY L?P T?C ?I KHM N?U:   ?i ??i ti?n ra mu ?? t??i.  Ch?ng to bn ko di h?n 4 ngy v tr?m tr?ng h?n.  Qu v? b? ?au b?ng ho?c ?au tr?c trng.  Phn c?a qu v? m?ng, trng nh? bt ch.  Qu v? b? s?t cn khng r nguyn nhn. ??M B?O QU V?:   Hi?u r cc h??ng d?n ny.  S? theo di tnh tr?ng c?a mnh.  S? yu c?u tr? gip ngay l?p t?c n?u qu v? c?m th?y khng kh?e ho?c th?y tr?m tr?ng h?n.   Thng tin ny khng nh?m m?c ?ch thay th? cho l?i khuyn m chuyn gia ch?m [place] s?c kh?e ni v?i qu v?. Hy b?o ??m qu v? ph?i th?o lu?n b?t k? v?n ?? g m qu v? c v?i chuyn gia ch?m  s?c kh?e c?a qu v?.   Document Released: 06/14/2010 Document Revised: 03/20/2014 Elsevier Interactive Patient Education [employer].

## 2016-01-12 ENCOUNTER — Encounter: Payer: Self-pay | Admitting: Physician Assistant

## 2016-01-12 LAB — GC/CHLAMYDIA PROBE AMP
CT Probe RNA: NOT DETECTED
GC Probe RNA: NOT DETECTED

## 2016-01-12 LAB — HIV ANTIBODY (ROUTINE TESTING W REFLEX): HIV 1&2 Ab, 4th Generation: NONREACTIVE

## 2016-01-12 MED ORDER — FOLIC ACID 400 MCG PO TABS: 400.0000 ug | ORAL_TABLET | Freq: Every day | ORAL | 4 refills | Status: DC

## 2016-01-12 NOTE — Progress Notes (Signed)
Please call patient and let him know his lab results are in and look good. Blood count looks great. I reduced his dose of Folic Acid... it is available now at his pharmacy. He needs to follow-up in three months to see a provider and a re-check of CBC.  His lipids look pretty good. Triglycerides are elevated, which is likely due his alcohol consumption. Continue taking statin as directed. I will send him a letter with detailed description of his results. Thank you!

## 2016-01-12 NOTE — Progress Notes (Signed)
Patient is to follow-up in three months to see a provider for evaluation of fatigue and check CBC. Folic Acid 1MG  was initiated in 2008 for fatigue by his cardiologist. Reduced dose to 400 mcg, as he is asymptomatic with normal CBC.  History of Brugada Syndrome and internal defibrillator device (2007). Has not had interrogation in over a year. He missed his appointment five months ago - states he has an appointment Dec. 19, 2017.

## 2016-02-29 ENCOUNTER — Encounter: Payer: Self-pay | Admitting: Internal Medicine

## 2016-02-29 ENCOUNTER — Ambulatory Visit (INDEPENDENT_AMBULATORY_CARE_PROVIDER_SITE_OTHER): Payer: BLUE CROSS/BLUE SHIELD | Admitting: Internal Medicine

## 2016-02-29 ENCOUNTER — Encounter (INDEPENDENT_AMBULATORY_CARE_PROVIDER_SITE_OTHER): Payer: Self-pay

## 2016-02-29 VITALS — BP 102/66 | HR 61 | Ht 67.0 in | Wt 173.4 lb

## 2016-02-29 DIAGNOSIS — I498 Other specified cardiac arrhythmias: Secondary | ICD-10-CM

## 2016-02-29 DIAGNOSIS — Z95810 Presence of automatic (implantable) cardiac defibrillator: Secondary | ICD-10-CM

## 2016-02-29 LAB — CUP PACEART INCLINIC DEVICE CHECK
Battery Voltage: 2.2 V — CL
Brady Statistic RV Percent Paced: 1 %
Implantable Lead Implant Date: 20071207
Implantable Lead Model: 7001
Implantable Pulse Generator Implant Date: 20071207
Lead Channel Setting Pacing Amplitude: 6 V
Lead Channel Setting Pacing Pulse Width: 1 ms
MDC IDC LEAD LOCATION: 753860
MDC IDC PG SERIAL: 317209
MDC IDC SESS DTM: 20171219170558
MDC IDC SET LEADCHNL RV SENSING SENSITIVITY: 0.3 mV

## 2016-02-29 NOTE — Progress Notes (Signed)
HPI Chad [name] returns today for followup. He is a very pleasant 45 year-old man with a history of syncope and EKG consistent with Brugada syndrome, who underwent insertion of a prophylactic defibrillator in 2007. Since then, he has done well although he developed a left upper extremity DVT and was placed on anti-coagulation. I have not seen him in over a year. He returns today and his device is at EOL. Interogation was obtained demonstrating an episode of polymorphic VT lasting 15 seconds. He did not have syncope.    No Known Allergies   Current Outpatient Prescriptions  Medication Sig Dispense Refill  . atorvastatin (LIPITOR) 20 MG tablet TAKE 1 TABLET BY MOUTH DAILY 30 tablet 1  . folic acid (FOLVITE) 1 MG tablet Take 1 mg by mouth daily.    Marland Kitchen omeprazole (PRILOSEC) 20 MG capsule Take 1 capsule (20 mg total) by mouth daily as needed (heartburn). 30 capsule 6   No current facility-administered medications for this visit.      Past Medical History:  Diagnosis Date  . Brugada syndrome    s/p ICD 12/07  . GERD (gastroesophageal reflux disease)   . Hyperlipidemia   . Syncope    s/p ICD 12/07    ROS:   All systems reviewed and negative except as noted in the HPI.   Past Surgical History:  Procedure Laterality Date  . BACK SURGERY  1997  . CARDIAC DEFIBRILLATOR PLACEMENT  02/2006   [employer]     Family History  Problem Relation Age of Onset  . Hyperlipidemia Mother   . Heart disease Mother   . Diabetes Mother      Social History   Social History  . Marital status: Married    Spouse name: N/A  . Number of children: 2  . Years of education: GED   Occupational History  .  [employer]   Social History Main Topics  . Smoking status: Former Smoker    Quit date: 03/14/2011  . Smokeless tobacco: Not on file  . Alcohol use 12.6 oz/week    21 Cans of beer per week  . Drug use: No  . Sexual activity: Not on file   Other Topics Concern  . Not on file    Social History Narrative   Married     BP 102/66   Pulse 61   Ht 5\' 7"  (1.702 m)   Wt 173 lb 6.4 oz (78.7 kg)   BMI 27.16 kg/m   Physical Exam:  Well appearing 45 year old man, NAD HEENT: Unremarkable Neck:  6 cm JVD, no thyromegally Lungs:  Clear with no wheezes, rales, or rhonchi. Well-healed ICD incision HEART:  Regular rate rhythm, no murmurs, no rubs, no clicks Abd:  soft, positive bowel sounds, no organomegally, no rebound, no guarding Ext:  2 plus pulses, no edema, no cyanosis, no clubbing Skin:  No rashes no nodules Neuro:  CN II through XII intact, motor grossly intact  DEVICE  Normal device function.  See PaceArt for details.   Assess/Plan:  1. Brugada pattern on ECG and syncope - he has had no arrhythmias in 10 years until this past May. In light of his long episode of PMVT, I am recommending that he undergo insertion of another ICD. 2. ICD - his device is at end of life. As he has an old [employer] Jude Riata 7000 series lead which is at risk for failure, and because he is so young, I have recommended  that we remove his old ICD lead and insert a new lead at the time of generator removal.  3. Left upper extremity DVT - it would be my expectation that his vein is occluded. Hopefully we can re-establish access to the central circulation when he undergoes device removal.  Chad [name],M.D.

## 2016-02-29 NOTE — Patient Instructions (Addendum)
Medication Instructions:  Your physician recommends that you continue on your current medications as directed. Please refer to the Current Medication list given to you today.   Labwork: BMET / CBC w/ diff 1-2 weeks prior to procedure  Testing/Procedures: ICD extraction and ICD implant  We will contact the hospital for scheduling. Our office will call you with the next availble date for procedure.   Follow-Up: Follow-up to be determined    Any Other Special Instructions Will Be Listed Below ---    If you need a refill on your cardiac medications before your next appointment, please call your pharmacy.

## 2016-03-03 ENCOUNTER — Telehealth: Payer: Self-pay

## 2016-03-03 NOTE — Telephone Encounter (Signed)
Received incoming call from pt. Pt confirmed appt date of 03/14/16 for complete ICD replacement. Reviewed all pre-op information.

## 2016-03-03 NOTE — Telephone Encounter (Signed)
Called pt. Left msg with procedure date 03/14/16 for ICD extraction and ICD implant. Requested call back to our office to review all pre-op information and make a date for pt to stop by and pick up CHG Soap.

## 2016-03-07 ENCOUNTER — Encounter (HOSPITAL_COMMUNITY)
Admission: RE | Admit: 2016-03-07 | Discharge: 2016-03-07 | Disposition: A | Discharge status: Home / Self Care | Payer: BLUE CROSS/BLUE SHIELD | Source: Ambulatory Visit | Attending: Internal Medicine | Admitting: Internal Medicine

## 2016-03-07 ENCOUNTER — Encounter (HOSPITAL_COMMUNITY): Payer: Self-pay

## 2016-03-07 DIAGNOSIS — Z01812 Encounter for preprocedural laboratory examination: Secondary | ICD-10-CM | POA: Diagnosis not present

## 2016-03-07 HISTORY — DX: Presence of cardiac pacemaker: Z95.0

## 2016-03-07 HISTORY — DX: Presence of automatic (implantable) cardiac defibrillator: Z95.810

## 2016-03-07 HISTORY — DX: Cardiac arrhythmia, unspecified: I49.9

## 2016-03-07 LAB — COMPREHENSIVE METABOLIC PANEL WITH GFR
ALT: 79 U/L — ABNORMAL HIGH (ref 17–63)
AST: 69 U/L — ABNORMAL HIGH (ref 15–41)
Albumin: 4.2 g/dL (ref 3.5–5.0)
Alkaline Phosphatase: 60 U/L (ref 38–126)
Anion gap: 6 (ref 5–15)
BUN: 13 mg/dL (ref 6–20)
CO2: 27 mmol/L (ref 22–32)
Calcium: 9.6 mg/dL (ref 8.9–10.3)
Chloride: 105 mmol/L (ref 101–111)
Creatinine, Ser: 0.97 mg/dL (ref 0.61–1.24)
GFR calc Af Amer: >60 mL/min
GFR calc non Af Amer: >60 mL/min
Glucose, Bld: 111 mg/dL — ABNORMAL HIGH (ref 65–99)
Potassium: 4.3 mmol/L (ref 3.5–5.1)
Sodium: 138 mmol/L (ref 135–145)
Total Bilirubin: 0.7 mg/dL (ref 0.3–1.2)
Total Protein: 7 g/dL (ref 6.5–8.1)

## 2016-03-07 LAB — CBC
HCT: 44.4 % (ref 39.0–52.0)
Hemoglobin: 15 g/dL (ref 13.0–17.0)
MCH: 31.1 pg (ref 26.0–34.0)
MCHC: 33.8 g/dL (ref 30.0–36.0)
MCV: 91.9 fL (ref 78.0–100.0)
Platelets: 332 K/uL (ref 150–400)
RBC: 4.83 MIL/uL (ref 4.22–5.81)
RDW: 12 % (ref 11.5–15.5)
WBC: 7.4 K/uL (ref 4.0–10.5)

## 2016-03-07 LAB — SURGICAL PCR SCREEN
MRSA, PCR: NEGATIVE
Staphylococcus aureus: NEGATIVE

## 2016-03-07 NOTE — Progress Notes (Addendum)
Patient was offered an interpreter but denied needing one. Does not have a PCP, goes to Urgent Care if needed Cardiologist Dr. Lewayne Bunting. Denies having a echo, stress test or cardiac cath.   [employer] faxed and called. Message left for [place] at [facility], patient's surgery date/time and arrival time.

## 2016-03-08 ENCOUNTER — Encounter (HOSPITAL_COMMUNITY): Payer: Self-pay

## 2016-03-08 NOTE — Progress Notes (Signed)
Anesthesia chart review: Patient is a 45 year old male posted for ICD lead removal, TEE on 03/14/16 by Dr. Ladona Ridgel. (Orders are pending.) He had prophylactic insertion of a single chamber ICD in 2007 for recurrent syncope and EKG changes consistent with Brugada syndrome. He has had no further syncope however device is now at end-of-life. Recent interrogation did demonstrate an episode of polymorphic VT lasting 15 seconds. Since he has an old [employer]. Jude Riata 7000 series lead it is at risk for failure and because he is so young Dr. Ladona Ridgel is recommending removing the old lead and inserting a new lead at the time of the generator replacement. Of note, Dr. Lubertha Basque expectation is that his vein is occluded and hopes to re-establish access to the central circulation when he undergoes device removal.  History includes former smoker (quit 03/14/11), syncope with Brugada syndrome s/p single chamber [employer] Jude ICD 02/16/06, LUE DVT 11/23/13 (s/p Xarelto X ~ 6 months), HLD, back surgery '97, left long finder crush injury s/p cross finger flap from left middle finger to left index finger with full thickness graft from left arm to left middle finger '02.   No PCP--[facility] as [facility].   Meds include Lipitor, Prilosec.  BP 129/84   Pulse 70   Temp 36.7 C   Resp 20   Ht 5\' 7"  (1.702 m)   Wt 169 lb 14.4 oz (77.1 kg)   SpO2 100%   BMI 26.61 kg/m   EKG 02/29/16: NSR.  Echo 02/15/06: SUMMARY - Overall left ventricular systolic function was normal. Left    ventricular ejection fraction was estimated to be 65 %. There    were no left ventricular regional wall motion abnormalities. - Normal aortic valve - Normal mitral valve - Normal tricuspid valve  Preoperative labs noted. AST and ALT are mildly elevated at 69 and 79, respectively. CBC is normal. Glucose 111. Creatinine 0.97.  PAT RN notified [employer] about surgery date/time. There is a letter  with DRUGS TO BE AVOIDED/PREFERABLE AVOIDED in patient's with Brugada syndrome placed in patient's hard chart.  Velna Ochs [facility] Short Stay Center/Anesthesiology Phone [phone] 03/08/2016 9:58 AM

## 2016-03-10 ENCOUNTER — Telehealth: Payer: Self-pay | Admitting: Internal Medicine

## 2016-03-10 NOTE — Telephone Encounter (Signed)
New Message  Pt call requesting to speak with RN about his procedure on 1/2. Please call back to discuss

## 2016-03-10 NOTE — Telephone Encounter (Signed)
Spoke with patient, he is c/o scratchy throat. He is not running a fever and has no other complaints.  He was seen pre-op at the hospital and was told he could not take any OTC medications the week prior to his procedure.  I have asked he gargle with warm salt water, use chloraseptic spay and take Tylenol if needed.  He will call back if he becomes febrile and understands he may need to reschedule if this happens.

## 2016-03-10 NOTE — Progress Notes (Signed)
Verified with [place] that [facility] will be present for D.O.S.

## 2016-03-14 ENCOUNTER — Ambulatory Visit (HOSPITAL_COMMUNITY)
Admission: RE | Admit: 2016-03-14 | Discharge: 2016-03-15 | Disposition: A | Discharge status: Home / Self Care | Payer: BLUE CROSS/BLUE SHIELD | Source: Ambulatory Visit | Attending: Internal Medicine | Admitting: Internal Medicine

## 2016-03-14 ENCOUNTER — Inpatient Hospital Stay (HOSPITAL_COMMUNITY): Payer: BLUE CROSS/BLUE SHIELD

## 2016-03-14 ENCOUNTER — Encounter (HOSPITAL_COMMUNITY): Payer: Self-pay | Admitting: Anesthesiology

## 2016-03-14 ENCOUNTER — Encounter (HOSPITAL_COMMUNITY)
Admission: RE | Disposition: A | Discharge status: Home / Self Care | Payer: Self-pay | Source: Ambulatory Visit | Attending: Internal Medicine

## 2016-03-14 ENCOUNTER — Inpatient Hospital Stay (HOSPITAL_COMMUNITY): Payer: BLUE CROSS/BLUE SHIELD | Admitting: Vascular Surgery

## 2016-03-14 ENCOUNTER — Inpatient Hospital Stay (HOSPITAL_COMMUNITY): Payer: BLUE CROSS/BLUE SHIELD | Admitting: Anesthesiology

## 2016-03-14 DIAGNOSIS — I498 Other specified cardiac arrhythmias: Principal | ICD-10-CM | POA: Insufficient documentation

## 2016-03-14 DIAGNOSIS — Z4502 Encounter for adjustment and management of automatic implantable cardiac defibrillator: Secondary | ICD-10-CM | POA: Diagnosis not present

## 2016-03-14 DIAGNOSIS — Z006 Encounter for examination for normal comparison and control in clinical research program: Secondary | ICD-10-CM | POA: Insufficient documentation

## 2016-03-14 DIAGNOSIS — Z95810 Presence of automatic (implantable) cardiac defibrillator: Secondary | ICD-10-CM

## 2016-03-14 DIAGNOSIS — E785 Hyperlipidemia, unspecified: Secondary | ICD-10-CM | POA: Diagnosis not present

## 2016-03-14 DIAGNOSIS — Z86718 Personal history of other venous thrombosis and embolism: Secondary | ICD-10-CM | POA: Insufficient documentation

## 2016-03-14 DIAGNOSIS — I739 Peripheral vascular disease, unspecified: Secondary | ICD-10-CM | POA: Insufficient documentation

## 2016-03-14 DIAGNOSIS — T82110A Breakdown (mechanical) of cardiac electrode, initial encounter: Secondary | ICD-10-CM | POA: Diagnosis not present

## 2016-03-14 DIAGNOSIS — Z87891 Personal history of nicotine dependence: Secondary | ICD-10-CM | POA: Diagnosis not present

## 2016-03-14 DIAGNOSIS — E119 Type 2 diabetes mellitus without complications: Secondary | ICD-10-CM | POA: Diagnosis not present

## 2016-03-14 HISTORY — PX: ICD GENERATOR CHANGE: SHX5854

## 2016-03-14 HISTORY — PX: TEE WITHOUT CARDIOVERSION: SHX5443

## 2016-03-14 HISTORY — PX: ICD LEAD REMOVAL: SHX5855

## 2016-03-14 LAB — ABO/RH: ABO/RH(D): O POS

## 2016-03-14 LAB — PREPARE RBC (CROSSMATCH)

## 2016-03-14 SURGERY — REMOVAL, ELECTRODE LEAD, ICD
Anesthesia: General | Site: Chest

## 2016-03-14 MED ORDER — CEFAZOLIN IN D5W 1 GM/50ML IV SOLN
1.0000 g | Freq: Four times a day (QID) | INTRAVENOUS | Status: AC
  Administered 2016-03-14 – 2016-03-15 (×3): 1 g via INTRAVENOUS
  Filled 2016-03-14 (×3): qty 50

## 2016-03-14 MED ORDER — DEXTROSE 5 % IV SOLN
INTRAVENOUS | Status: DC | PRN
  Administered 2016-03-14: 25 ug/min via INTRAVENOUS

## 2016-03-14 MED ORDER — ATORVASTATIN CALCIUM 20 MG PO TABS
20.0000 mg | ORAL_TABLET | Freq: Every day | ORAL | Status: DC
  Administered 2016-03-14: 20 mg via ORAL
  Filled 2016-03-14: qty 1

## 2016-03-14 MED ORDER — FENTANYL CITRATE (PF) 250 MCG/5ML IJ SOLN
INTRAMUSCULAR | Status: AC
  Filled 2016-03-14: qty 5

## 2016-03-14 MED ORDER — FENTANYL CITRATE (PF) 100 MCG/2ML IJ SOLN
25.0000 ug | INTRAMUSCULAR | Status: DC | PRN
  Administered 2016-03-14: 50 ug via INTRAVENOUS

## 2016-03-14 MED ORDER — ONDANSETRON HCL 4 MG/2ML IJ SOLN: 4.0000 mg | Freq: Four times a day (QID) | INTRAMUSCULAR | Status: DC | PRN

## 2016-03-14 MED ORDER — ROCURONIUM BROMIDE 50 MG/5ML IV SOSY
PREFILLED_SYRINGE | INTRAVENOUS | Status: AC
  Filled 2016-03-14: qty 5

## 2016-03-14 MED ORDER — ROCURONIUM BROMIDE 100 MG/10ML IV SOLN
INTRAVENOUS | Status: DC | PRN
  Administered 2016-03-14: 50 mg via INTRAVENOUS
  Administered 2016-03-14: 20 mg via INTRAVENOUS

## 2016-03-14 MED ORDER — CEFAZOLIN SODIUM 1 G IJ SOLR
INTRAMUSCULAR | Status: AC
  Filled 2016-03-14: qty 20

## 2016-03-14 MED ORDER — CEFAZOLIN SODIUM 1 G IJ SOLR
INTRAMUSCULAR | Status: DC | PRN
  Administered 2016-03-14: 2 g via INTRAMUSCULAR

## 2016-03-14 MED ORDER — PROPOFOL 10 MG/ML IV BOLUS
INTRAVENOUS | Status: DC | PRN
  Administered 2016-03-14: 175 mg via INTRAVENOUS

## 2016-03-14 MED ORDER — GENTAMICIN SULFATE 40 MG/ML IJ SOLN
Freq: Once | INTRAMUSCULAR | Status: AC
  Administered 2016-03-14: 500 mL
  Filled 2016-03-14: qty 2

## 2016-03-14 MED ORDER — ACETAMINOPHEN 325 MG PO TABS
325.0000 mg | ORAL_TABLET | ORAL | Status: DC | PRN
  Administered 2016-03-14 – 2016-03-15 (×2): 650 mg via ORAL
  Filled 2016-03-14 (×2): qty 2

## 2016-03-14 MED ORDER — SUGAMMADEX SODIUM 200 MG/2ML IV SOLN
INTRAVENOUS | Status: DC | PRN
  Administered 2016-03-14: 200 mg via INTRAVENOUS

## 2016-03-14 MED ORDER — FENTANYL CITRATE (PF) 100 MCG/2ML IJ SOLN
INTRAMUSCULAR | Status: AC
  Filled 2016-03-14: qty 2

## 2016-03-14 MED ORDER — LACTATED RINGERS IV SOLN
INTRAVENOUS | Status: DC | PRN
  Administered 2016-03-14 (×2): via INTRAVENOUS

## 2016-03-14 MED ORDER — PHENYLEPHRINE 40 MCG/ML (10ML) SYRINGE FOR IV PUSH (FOR BLOOD PRESSURE SUPPORT)
PREFILLED_SYRINGE | INTRAVENOUS | Status: AC
  Filled 2016-03-14: qty 10

## 2016-03-14 MED ORDER — LIDOCAINE HCL (PF) 1 % IJ SOLN
INTRAMUSCULAR | Status: AC
  Filled 2016-03-14: qty 30

## 2016-03-14 MED ORDER — OXYCODONE HCL 5 MG/5ML PO SOLN: 5.0000 mg | Freq: Once | ORAL | Status: DC | PRN

## 2016-03-14 MED ORDER — SUGAMMADEX SODIUM 200 MG/2ML IV SOLN
INTRAVENOUS | Status: AC
  Filled 2016-03-14: qty 2

## 2016-03-14 MED ORDER — OXYCODONE HCL 5 MG PO TABS: 5.0000 mg | ORAL_TABLET | Freq: Once | ORAL | Status: DC | PRN

## 2016-03-14 MED ORDER — SODIUM CHLORIDE 0.9 % IV SOLN
INTRAVENOUS | Status: DC | PRN
  Administered 2016-03-14: 500 mL

## 2016-03-14 MED ORDER — LIDOCAINE 2% (20 MG/ML) 5 ML SYRINGE
INTRAMUSCULAR | Status: AC
  Filled 2016-03-14: qty 5

## 2016-03-14 MED ORDER — PROPOFOL 10 MG/ML IV BOLUS
INTRAVENOUS | Status: AC
  Filled 2016-03-14: qty 20

## 2016-03-14 MED ORDER — ONDANSETRON HCL 4 MG/2ML IJ SOLN: 4.0000 mg | Freq: Once | INTRAMUSCULAR | Status: DC | PRN

## 2016-03-14 MED ORDER — MIDAZOLAM HCL 5 MG/5ML IJ SOLN
INTRAMUSCULAR | Status: DC | PRN
  Administered 2016-03-14: 2 mg via INTRAVENOUS

## 2016-03-14 MED ORDER — PHENYLEPHRINE HCL 10 MG/ML IJ SOLN
INTRAMUSCULAR | Status: DC | PRN
  Administered 2016-03-14: 80 ug via INTRAVENOUS

## 2016-03-14 MED ORDER — LIDOCAINE HCL (CARDIAC) 20 MG/ML IV SOLN
INTRAVENOUS | Status: DC | PRN
  Administered 2016-03-14: 50 mg via INTRAVENOUS

## 2016-03-14 MED ORDER — LIDOCAINE HCL (PF) 1 % IJ SOLN
INTRAMUSCULAR | Status: DC | PRN
  Administered 2016-03-14: 30 mL via INTRADERMAL

## 2016-03-14 MED ORDER — ONDANSETRON HCL 4 MG/2ML IJ SOLN
INTRAMUSCULAR | Status: AC
Start: 2016-03-14 — End: 2016-03-14
  Filled 2016-03-14: qty 2

## 2016-03-14 MED ORDER — MIDAZOLAM HCL 2 MG/2ML IJ SOLN
INTRAMUSCULAR | Status: AC
  Filled 2016-03-14: qty 2

## 2016-03-14 MED ORDER — FENTANYL CITRATE (PF) 100 MCG/2ML IJ SOLN
INTRAMUSCULAR | Status: DC | PRN
  Administered 2016-03-14: 100 ug via INTRAVENOUS
  Administered 2016-03-14: 50 ug via INTRAVENOUS

## 2016-03-14 MED ORDER — ONDANSETRON HCL 4 MG/2ML IJ SOLN
INTRAMUSCULAR | Status: DC | PRN
  Administered 2016-03-14: 4 mg via INTRAVENOUS

## 2016-03-14 SURGICAL SUPPLY — 64 items
BAG BANDED W/RUBBER/TAPE 36X54 (MISCELLANEOUS) ×1 IMPLANT
BAG DECANTER FOR FLEXI CONT (MISCELLANEOUS) ×2 IMPLANT
BAG EQP BAND 135X91 W/RBR TAPE (MISCELLANEOUS) ×2
BLADE 10 SAFETY STRL DISP (BLADE) ×2 IMPLANT
BLADE OSCILLATING /SAGITTAL (BLADE) IMPLANT
BLADE STERNUM SYSTEM 6 (BLADE) ×1 IMPLANT
BLADE SURG 10 STRL SS (BLADE) ×1 IMPLANT
BLADE SURG ROTATE 9660 (MISCELLANEOUS) IMPLANT
BNDG ADH 5X4 AIR PERM ELC (GAUZE/BANDAGES/DRESSINGS) ×2
BNDG COHESIVE 4X5 WHT NS (GAUZE/BANDAGES/DRESSINGS) ×3 IMPLANT
CANISTER SUCTION 2500CC (MISCELLANEOUS) ×3 IMPLANT
COIL ONE TIE COMPRESSION (MISCELLANEOUS) ×1 IMPLANT
COVER TABLE BACK 60X90 (DRAPES) ×3 IMPLANT
DRAPE C-ARM 42X72 X-RAY (DRAPES) IMPLANT
DRAPE CARDIOVASCULAR INCISE (DRAPES) ×3
DRAPE INCISE IOBAN 66X45 STRL (DRAPES) IMPLANT
DRAPE PROXIMA HALF (DRAPES) ×4 IMPLANT
DRAPE SRG 135X102X78XABS (DRAPES) ×2 IMPLANT
DRSG TEGADERM 2-3/8X2-3/4 SM (GAUZE/BANDAGES/DRESSINGS) ×1 IMPLANT
DRSG TEGADERM 4X4.75 (GAUZE/BANDAGES/DRESSINGS) ×1 IMPLANT
ELECT REM PT RETURN 9FT ADLT (ELECTROSURGICAL) ×6
ELECTRODE REM PT RTRN 9FT ADLT (ELECTROSURGICAL) ×4 IMPLANT
FELT TEFLON 1X6 (MISCELLANEOUS) IMPLANT
GAUZE SPONGE 2X2 8PLY STRL LF (GAUZE/BANDAGES/DRESSINGS) IMPLANT
GAUZE SPONGE 4X4 12PLY STRL (GAUZE/BANDAGES/DRESSINGS) ×3 IMPLANT
GAUZE SPONGE 4X4 16PLY XRAY LF (GAUZE/BANDAGES/DRESSINGS) ×1 IMPLANT
GLOVE BIOGEL PI IND STRL 7.5 (GLOVE) ×2 IMPLANT
GLOVE BIOGEL PI INDICATOR 7.5 (GLOVE) ×2
GLOVE ECLIPSE 7.5 STRL STRAW (GLOVE) ×1 IMPLANT
GLOVE ECLIPSE 8.0 STRL XLNG CF (GLOVE) ×3 IMPLANT
GLOVE INDICATOR 7.5 STRL GRN (GLOVE) ×1 IMPLANT
GOWN STRL REUS W/ TWL LRG LVL3 (GOWN DISPOSABLE) ×2 IMPLANT
GOWN STRL REUS W/ TWL XL LVL3 (GOWN DISPOSABLE) ×2 IMPLANT
GOWN STRL REUS W/TWL LRG LVL3 (GOWN DISPOSABLE) ×3
GOWN STRL REUS W/TWL XL LVL3 (GOWN DISPOSABLE) ×3
GUIDEWIRE ANGLED .035X150CM (WIRE) ×1 IMPLANT
KIT ROOM TURNOVER OR (KITS) ×3 IMPLANT
LEAD DURATA 7122-60CM (Lead) ×1 IMPLANT
NDL PERC 18GX7CM (NEEDLE) IMPLANT
NEEDLE PERC 18GX7CM (NEEDLE) ×3 IMPLANT
NS IRRIG 1000ML POUR BTL (IV SOLUTION) IMPLANT
PAD ARMBOARD 7.5X6 YLW CONV (MISCELLANEOUS) ×6 IMPLANT
PAD ELECT DEFIB RADIOL ZOLL (MISCELLANEOUS) ×3 IMPLANT
SHEATH CRV INNER FEMORAL 12FR (SHEATH) ×1 IMPLANT
SHEATH EVOLUTION RL 11F (SHEATH) ×1 IMPLANT
SHEATH EVOLUTION SHORTE RL 11F (SHEATH) ×1 IMPLANT
SHEATH PINNACLE 6F 10CM (SHEATH) ×1 IMPLANT
SPONGE GAUZE 2X2 STER 10/PKG (GAUZE/BANDAGES/DRESSINGS) ×1
SPONGE GAUZE 4X4 12PLY STER LF (GAUZE/BANDAGES/DRESSINGS) ×1 IMPLANT
STYLET LIBERATOR LOCKING (MISCELLANEOUS) ×1 IMPLANT
SUT PROLENE 2 0 CT2 30 (SUTURE) IMPLANT
SUT PROLENE 2 0 SH DA (SUTURE) IMPLANT
SUT SILK 1 SH (SUTURE) ×1 IMPLANT
SUT VIC AB 2-0 CT1 27 (SUTURE) ×3
SUT VIC AB 2-0 CT1 27XBRD (SUTURE) IMPLANT
SUT VIC AB 2-0 CT2 18 VCP726D (SUTURE) IMPLANT
SUT VIC AB 3-0 X1 27 (SUTURE) IMPLANT
SYSTEM VR-ICD ELLIPSE NEXT GEN (Miscellaneous) ×1 IMPLANT
TOWEL OR 17X24 6PK STRL BLUE (TOWEL DISPOSABLE) ×6 IMPLANT
TOWEL OR 17X26 10 PK STRL BLUE (TOWEL DISPOSABLE) ×6 IMPLANT
TRAY FOLEY CATH 16FRSI W/METER (SET/KITS/TRAYS/PACK) ×3 IMPLANT
TUBE CONNECTING 12X1/4 (SUCTIONS) IMPLANT
WIRE BENTSON .035X145CM (WIRE) ×1 IMPLANT
YANKAUER SUCT BULB TIP NO VENT (SUCTIONS) IMPLANT

## 2016-03-14 NOTE — Consult Note (Signed)
      [address]       [place] [address]             [phone]      Patient is 46 year-old man with a history of syncope and EKG consistent with Brugada syndrome. He  underwent insertion of a prophylactic defibrillator in 2007.  He returned to see Dr Ladona Ridgel. His device is at EOL. Interogation was obtained demonstrating an episode of polymorphic VT lasting 15 seconds. He did not have syncope.   He presents  today for removal of lead and replacement with new lead and AICD generator change.   I was requested by Dr Ladona Ridgel to provided dedicated Cardiac Surgical  back up for lead removal and replacement. This was done, lead removed without complications ,  required 1.5 hours stand by.    Delight Ovens MD      [address] [place] [place] [address] Office [phone]   [place]eeper [phone]

## 2016-03-14 NOTE — Anesthesia Procedure Notes (Signed)
Arterial Line Insertion Start/End1/04/2016 7:43 AM, 03/14/2016 7:50 AM Performed by: Noreene Larsson, Destynie Toomey, anesthesiologist  Preanesthetic checklist: patient identified, IV checked, site marked, risks and benefits discussed, surgical consent, monitors and equipment checked, pre-op evaluation and timeout performed Left, radial was placed Catheter size: 20 Fr  Attempts: 1 Following insertion, Biopatch and dressing applied. Patient tolerated the procedure well with no immediate complications.

## 2016-03-14 NOTE — Anesthesia Postprocedure Evaluation (Signed)
Anesthesia Post Note  Patient: Lorraine Churchwell  Procedure(s) Performed: Procedure(s) (LRB): ICD GENERATOR REMOVAL AND INSERTION OF NEW ICD, WITH LEAD  REMOVAL EXTRACTION (N/A) TRANSESOPHAGEAL ECHOCARDIOGRAM (TEE) (N/A)  Patient location during evaluation: PACU Anesthesia Type: General Level of consciousness: awake, awake and alert and oriented Pain management: pain level controlled Vital Signs Assessment: post-procedure vital signs reviewed and stable Respiratory status: spontaneous breathing, nonlabored ventilation, respiratory function stable and patient connected to nasal cannula oxygen Cardiovascular status: blood pressure returned to baseline Anesthetic complications: no       Last Vitals:  Vitals:   03/14/16 1535 03/14/16 1605  BP: 111/74 114/75  Pulse: 64 65  Resp: 16 12  Temp:      Last Pain:  Vitals:   03/14/16 1435  TempSrc:   PainSc: 0-No pain                 Jamea Robicheaux COKER

## 2016-03-14 NOTE — Interval H&P Note (Signed)
History and Physical Interval Note: I have researched the possible failure rate further since his last visit. He has a Riatta lead with optum insulation which would make the risk of failure less. Will obtain a CXR and if no externalization of the ICD lead, will plan to perform ICD generator change alone and not extract his ICD lead.   03/14/2016 7:41 AM  Chad [name]  has presented today for surgery, with the diagnosis of Brugada syndrome  The various methods of treatment have been discussed with the patient and family. After consideration of risks, benefits and other options for treatment, the patient has consented to  Procedure(s): ICD LEAD  REMOVAL with insert a new lead and generator exchange (N/A) TRANSESOPHAGEAL ECHOCARDIOGRAM (TEE) (N/A) as a surgical intervention .  The patient's history has been reviewed, patient examined, no change in status, stable for surgery.  I have reviewed the patient's chart and labs.  Questions were answered to the patient's satisfaction.     Chad [name].D.

## 2016-03-14 NOTE — Anesthesia Procedure Notes (Signed)
Procedure Name: Intubation Date/Time: 03/14/2016 7:55 AM Performed by: Neldon Newport Pre-anesthesia Checklist: Timeout performed, Patient being monitored, Suction available, Emergency Drugs available and Patient identified Patient Re-evaluated:Patient Re-evaluated prior to inductionOxygen Delivery Method: Circle system utilized Preoxygenation: Pre-oxygenation with 100% oxygen Intubation Type: IV induction Ventilation: Mask ventilation without difficulty Laryngoscope Size: Mac and 3 Grade View: Grade II Tube type: Oral Tube size: 7.5 mm Number of attempts: 1 Placement Confirmation: breath sounds checked- equal and bilateral,  ETT inserted through vocal cords under direct vision and positive ETCO2 Secured at: 23 cm Tube secured with: Tape Dental Injury: Teeth and Oropharynx as per pre-operative assessment

## 2016-03-14 NOTE — Anesthesia Postprocedure Evaluation (Signed)
Anesthesia Post Note  Patient: Chad [name]  Procedure(s) Performed: Procedure(s) (LRB): ICD GENERATOR REMOVAL AND INSERTION OF NEW ICD, WITH LEAD  REMOVAL EXTRACTION (N/A) TRANSESOPHAGEAL ECHOCARDIOGRAM (TEE) (N/A)  Patient location during evaluation: PACU Anesthesia Type: General Level of consciousness: awake, awake and alert and oriented Pain management: pain level controlled Vital Signs Assessment: post-procedure vital signs reviewed and stable Respiratory status: spontaneous breathing, nonlabored ventilation, respiratory function stable and patient connected to nasal cannula oxygen Cardiovascular status: blood pressure returned to baseline Postop Assessment: no headache Anesthetic complications: no       Last Vitals:  Vitals:   03/14/16 1330 03/14/16 1348  BP: 123/76   Pulse: (!) 58 64  Resp: 11 15  Temp:  36.1 C    Last Pain:  Vitals:   03/14/16 1330  TempSrc:   PainSc: Asleep                 Makeshia Seat COKER

## 2016-03-14 NOTE — Anesthesia Preprocedure Evaluation (Addendum)
Anesthesia Evaluation  Patient identified by MRN, date of birth, ID band Patient awake    Reviewed: Allergy & Precautions, NPO status , Patient's Chart, lab work & pertinent test results  Airway Mallampati: II  TM Distance: >3 FB Neck ROM: full    Dental  (+) Teeth Intact, Dental Advidsory Given, Dental Advisory Given   Pulmonary former smoker,    breath sounds clear to auscultation       Cardiovascular + Peripheral Vascular Disease  + dysrhythmias + pacemaker + Cardiac Defibrillator  Rhythm:regular Rate:Bradycardia     Neuro/Psych    GI/Hepatic   Endo/Other    Renal/GU      Musculoskeletal   Abdominal   Peds  Hematology   Anesthesia Other Findings   Reproductive/Obstetrics                           Anesthesia Physical Anesthesia Plan  ASA: III  Anesthesia Plan: General   Post-op Pain Management:    Induction: Intravenous  Airway Management Planned: Oral ETT  Additional Equipment: Arterial line  Intra-op Plan:   Post-operative Plan:   Informed Consent: I have reviewed the patients History and Physical, chart, labs and discussed the procedure including the risks, benefits and alternatives for the proposed anesthesia with the patient or authorized representative who has indicated his/her understanding and acceptance.   Dental advisory given and Dental Advisory Given  Plan Discussed with: CRNA and Anesthesiologist  Anesthesia Plan Comments:        Anesthesia Quick Evaluation

## 2016-03-14 NOTE — Discharge Summary (Addendum)
ELECTROPHYSIOLOGY PROCEDURE DISCHARGE SUMMARY    Patient ID: Chad [name],  MRN: [id number], DOB/AGE: 1970-08-06 46 y.o.  Admit date: 03/14/2016 Discharge date: 03/15/2016  Primary Care Physician: Lucilla Edin, MD  Electrophysiologist: Dr. Ladona Ridgel  Primary Discharge Diagnosis:  1. ICD at EOL 2. RV Riata lead      cxr findings c/w externalization and lead inspection confirmed externalization at extraction  Secondary Discharge Diagnosis:  1. Brugada pattern?syncope 2. PMVT 3. Hx of LUE DVT 4. DM 5. HLD  No Known Allergies   Procedures This Admission:  1.  Extraction of single chamber ICD system with implantation of a new SJM single chamber ICD on 03/14/16 by Dr Ladona Ridgel.  The patient received a St. Jude single-chamber ICD, serial number [id number] with a St. Jude active fixation defibrillation lead, serial number [id number].  DFT's were successful at 20 J.  There were no immediate post procedure complications. 2.  CXR on 03/15/2016 demonstrated no pneumothorax status post device implantation. CXR was reviewed by Dr. Ladona Ridgel with stable (new) lead position.   Brief HPI: Chad [name] is a 46 y.o. male was referred to electrophysiology in the outpatient setting for consideration of ICD implantation.  Past medical history includes Brugada pattern, this year had a self terminated PMVT episode, HLD, DM.  The p atient's device had reached EOL and planned for generator change, given RV lead was a Riata lead plans for extraction were made, pre-op CXR was reviewed by Dr. Ladona Ridgel and confirmed findings c/w externalization of the lead .  Risks, benefits, and alternatives to system extraction and new ICD implantation were reviewed with the patient who wished to proceed.    Hospital Course:  The patient was admitted and underwent ICD system extraction and new implantation of an ICD with details as outlined above. He was monitored on telemetry overnight which demonstrated NSR.  Left chest was without hematoma or  ecchymosis.  The device was interrogated and found to be functioning normally.  CXR was obtained and demonstrated no pneumothorax status post device implantation.  Wound care, arm mobility, and restrictions were reviewed with the patient.  The patient developed immediately post-op R groin hematoma, additional pressure was held.  The site is stable this morning. The patient was examined by Dr. Ladona Ridgel and considered stable for discharge to home.    Physical Exam: Vitals:   03/14/16 2010 03/15/16 0616 03/15/16 0617 03/15/16 0803  BP: 124/76 112/79  109/72  Pulse: 67 (!) 55  64  Resp: 14 17  (!) 21  Temp: 98.4 F (36.9 C)  98.2 F (36.8 C) 98 F (36.7 C)  TempSrc: Oral  Oral Oral  SpO2: 99% 97%  99%  Weight:   166 lb 1.6 oz (75.3 kg)   Height:        GEN- The patient is well appearing, alert and oriented x 3 today.   HEENT: normocephalic, atraumatic; sclera clear, conjunctiva pink; hearing intact; oropharynx clear Lungs- Clear to ausculation bilaterally, normal work of breathing.  No wheezes, rales, rhonchi Heart- Regular rate and rhythm, no murmurs, rubs or gallops, PMI not laterally displaced GI- soft, non-tender, non-distended, bowel sounds present Extremities- no clubbing, cyanosis, or edema; DP/PT/radial pulses 2+ bilaterally MS- no significant deformity or atrophy Skin- warm and dry, no rash or lesion, left chest without hematoma/ecchymosis, R groin without hematoma, is soft. Psych- euthymic mood, full affect Neuro- no gross defecits  Labs:   Lab Results  Component Value Date   WBC 7.4 03/07/2016  HGB 15.0 03/07/2016   HCT 44.4 03/07/2016   MCV 91.9 03/07/2016   PLT 332 03/07/2016   No results for input(s): NA, K, CL, CO2, BUN, CREATININE, CALCIUM, PROT, BILITOT, ALKPHOS, ALT, AST, GLUCOSE in the last 168 hours.  Invalid input(s): LABALBU  Discharge Medications:  Allergies as of 03/15/2016   No Known Allergies     Medication List    TAKE these medications     atorvastatin 20 MG tablet Commonly known as:  LIPITOR TAKE 1 TABLET BY MOUTH DAILY What changed:  See the new instructions.   omeprazole 20 MG capsule Commonly known as:  PRILOSEC Take 1 capsule (20 mg total) by mouth daily as needed (heartburn).       Disposition: Home Discharge Instructions    Diet - low sodium heart healthy    Complete by:  As directed    Increase activity slowly    Complete by:  As directed      Follow-up Information    Lewayne Bunting, MD Follow up on 03/23/2016.   Specialty:  Cardiology Why:  2:30PM, wound check/nursing visit only Contact information: [address] [place] [address] [phone]           Duration of Discharge Encounter: Greater than 30 minutes including physician time.  Norma Fredrickson, PA-C 03/15/2016 8:52 AM  EP Attending  Patient seen and examined. Agree with above. He is stable for DC home. Usual followup.   [name].D.

## 2016-03-14 NOTE — Op Note (Signed)
EP procedure note  Procedure performed: Extraction of a previously implanted ICD system followed by insertion of a new ICD system with ICD pocket revision  Preoperative diagnosis: Brugada syndrome, status post ICD implantation with the current device at elective replacement, with radiographic evidence of externalization of the defibrillation lead.  Postoperative diagnosis: Same as preoperative diagnosis  Description of the procedure: After informed consent was obtained, the patient was taken to the operating room in the fasting state. Invasive hemodynamic monitoring was applied by the anesthesia service as was general endotracheal anesthesia. A 6 French sheath was inserted percutaneously into the right femoral vein for additional IV access. 20 cc of lidocaine was infiltrated into the left pectoral region. A 7 cm incision was carried out. Electrocautery was utilized to dissect down to the ICD generator. The generator was removed with gentle traction. Electrocautery was then utilized to free up the dense fibrous scar tissue around the defibrillator lead. Access to the left subclavian vein was assured after the vein was punctured and a Glidewire advanced under fluoroscopic guidance down to the inferior vena cava. A stylette was then inserted into the body of the lead and attempts to retract the helix were carried out. The lead was then cut. A Liberator locking stylette was inserted into the lead and fixed in place. A Cook one tie was attached to the proximal portion of the lead. The Cook 9 [place] RL short mechanical dissection sheath was then advanced over the defibrillation lead. A combination of traction and countertraction, pressure and counter pressure were applied to the lead and the sheath. The dissection sheath was advanced to the junction between the subclavian and innominate vein but could go no further. Next the [place] RL 9 [place] long mechanical dissection sheath was inserted over the lead. The  proximal coil was in particular very scarred up. Utilizing the 9 French inner sheath as well as the outer sheath, the dissection was accomplished and the lead was removed with gentle traction. There were no hemodynamic consequences. Transesophageal echo which had been placed at the start of the case demonstrated no pericardial effusion. Next, attention was turned to placing the new ICD system.  A 9 French long peel-away sheath was inserted over the Glidewire and advanced to the right atrium. The St. Jude active fixation defibrillation lead, serial number [id number], was advanced under fluoroscopic guidance to the right ventricle. The lead was actively fixed near the RV apical septum. The R waves measured 10 mV, and the pacing threshold was less than a volt at 0.5 ms. The pacing impedance was 1100 ohms, and the shocking impedance was 72 ohms. The lead was actively fixed to the fascia with silk suture. The sewing sleeve was secured with silk suture. The new St. Jude single-chamber ICD, serial number [id number], was connected to the defibrillation lead. The pocket was revised by dissecting more medially to accommodate the device which had a different shape. The pocket was irrigated with antibiotic irrigation. The device was placed into the pocket and the pocket additionally irrigated. The incision was closed with 2 layers of Vicryl suture. Defibrillation threshold testing was carried out.  The patient was already sedated. Ventricular fibrillation was induced with a T-wave shock. A 20 J shock was delivered which terminated ventricular fibrillation arrest or sinus rhythm. Appropriate sensing was demonstrated.  Complications: There were no immediate procedure cup locations  Estimated blood loss: Less than 50 cc  Conclusion: Successful extraction of a St. Jude single-chamber ICD lead and removal of the previously implanted  ICD which had reached elective replacement and insertion of a new single chamber ICD system  with successful defibrillation threshold testing and ICD pocket revision.  Lewayne Bunting, M.D.

## 2016-03-14 NOTE — Transfer of Care (Signed)
Immediate Anesthesia Transfer of Care Note  Patient: Chad [name]  Procedure(s) Performed: Procedure(s): ICD GENERATOR REMOVAL AND INSERTION OF NEW ICD, WITH LEAD  REMOVAL EXTRACTION (N/A) TRANSESOPHAGEAL ECHOCARDIOGRAM (TEE) (N/A)  Patient Location: PACU  Anesthesia Type:General  Level of Consciousness: awake, alert  and oriented  Airway & Oxygen Therapy: Patient Spontanous Breathing and Patient connected to nasal cannula oxygen  Post-op Assessment: Report given to RN, Post -op Vital signs reviewed and stable and Patient moving all extremities X 4  Post vital signs: Reviewed and stable  Last Vitals:  Vitals:   03/14/16 0604  BP: 125/79  Pulse: (!) 55  Resp: 20  Temp: 36.7 C    Last Pain:  Vitals:   03/14/16 0604  TempSrc: Oral         Complications: No apparent anesthesia complications

## 2016-03-14 NOTE — H&P (View-Only) (Signed)
HPI Mr. Stoever returns today for followup. He is a very pleasant 46 year-old man with a history of syncope and EKG consistent with Brugada syndrome, who underwent insertion of a prophylactic defibrillator in 2007. Since then, he has done well although he developed a left upper extremity DVT and was placed on anti-coagulation. I have not seen him in over a year. He returns today and his device is at EOL. Interogation was obtained demonstrating an episode of polymorphic VT lasting 15 seconds. He did not have syncope.    No Known Allergies   Current Outpatient Prescriptions  Medication Sig Dispense Refill  . atorvastatin (LIPITOR) 20 MG tablet TAKE 1 TABLET BY MOUTH DAILY 30 tablet 1  . folic acid (FOLVITE) 1 MG tablet Take 1 mg by mouth daily.    Marland Kitchen omeprazole (PRILOSEC) 20 MG capsule Take 1 capsule (20 mg total) by mouth daily as needed (heartburn). 30 capsule 6   No current facility-administered medications for this visit.      Past Medical History:  Diagnosis Date  . Brugada syndrome    s/p ICD 12/07  . GERD (gastroesophageal reflux disease)   . Hyperlipidemia   . Syncope    s/p ICD 12/07    ROS:   All systems reviewed and negative except as noted in the HPI.   Past Surgical History:  Procedure Laterality Date  . BACK SURGERY  1997  . CARDIAC DEFIBRILLATOR PLACEMENT  02/2006   St. Jude Atlas     Family History  Problem Relation Age of Onset  . Hyperlipidemia Mother   . Heart disease Mother   . Diabetes Mother      Social History   Social History  . Marital status: Married    Spouse name: N/A  . Number of children: 2  . Years of education: GED   Occupational History  .  [employer]   Social History Main Topics  . Smoking status: Former Smoker    Quit date: 03/14/2011  . Smokeless tobacco: Not on file  . Alcohol use 12.6 oz/week    21 Cans of beer per week  . Drug use: No  . Sexual activity: Not on file   Other Topics Concern  . Not on file    Social History Narrative   Married     BP 102/66   Pulse 61   Ht 5\' 7"  (1.702 m)   Wt 173 lb 6.4 oz (78.7 kg)   BMI 27.16 kg/m   Physical Exam:  Well appearing 46 year old man, NAD HEENT: Unremarkable Neck:  6 cm JVD, no thyromegally Lungs:  Clear with no wheezes, rales, or rhonchi. Well-healed ICD incision HEART:  Regular rate rhythm, no murmurs, no rubs, no clicks Abd:  soft, positive bowel sounds, no organomegally, no rebound, no guarding Ext:  2 plus pulses, no edema, no cyanosis, no clubbing Skin:  No rashes no nodules Neuro:  CN II through XII intact, motor grossly intact  DEVICE  Normal device function.  See PaceArt for details.   Assess/Plan:  1. Brugada pattern on ECG and syncope - he has had no arrhythmias in 10 years until this past May. In light of his long episode of PMVT, I am recommending that he undergo insertion of another ICD. 2. ICD - his device is at end of life. As he has an old St. Jude Riata 7000 series lead which is at risk for failure, and because he is so young, I have recommended  that we remove his old ICD lead and insert a new lead at the time of generator removal.  3. Left upper extremity DVT - it would be my expectation that his vein is occluded. Hopefully we can re-establish access to the central circulation when he undergoes device removal.  Gregg Taylor,M.D. 

## 2016-03-14 NOTE — OR Nursing (Signed)
Patient moved from OR table to patient bed. Bleeding was noted from right groin and hematoma felt after moving to patient bed. Pressure held aagin on right groin 10 minutes. Dr. Ladona Ridgel notified.

## 2016-03-15 ENCOUNTER — Observation Stay (HOSPITAL_COMMUNITY): Payer: BLUE CROSS/BLUE SHIELD

## 2016-03-15 DIAGNOSIS — Z86718 Personal history of other venous thrombosis and embolism: Secondary | ICD-10-CM | POA: Diagnosis not present

## 2016-03-15 DIAGNOSIS — I498 Other specified cardiac arrhythmias: Secondary | ICD-10-CM | POA: Diagnosis not present

## 2016-03-15 DIAGNOSIS — Z95810 Presence of automatic (implantable) cardiac defibrillator: Secondary | ICD-10-CM | POA: Diagnosis not present

## 2016-03-15 DIAGNOSIS — E119 Type 2 diabetes mellitus without complications: Secondary | ICD-10-CM | POA: Diagnosis not present

## 2016-03-15 NOTE — Progress Notes (Signed)
Pt leaving floor for xray with Thomasin

## 2016-03-15 NOTE — Discharge Instructions (Signed)
° ° °  Right groin care No lifting over 5 lbs for 1 week. No sexual or vigorous activity for 1 week.  Keep procedure site clean & dry. If you notice increased pain, swelling, bleeding or pus, call/return!  No soaking baths/hot tubs/pools for 1 week.       Supplemental Discharge Instructions for  Pacemaker/Defibrillator Patients  Activity No heavy lifting or vigorous activity with your left/right arm for 6 to 8 weeks.  Do not raise your left/right arm above your head for one week.  Gradually raise your affected arm as drawn below.             03/18/16                       03/19/16                      03/20/16                     03/21/16 __  NO DRIVING for 1 week    ; you may begin driving on  07/19/50   .  WOUND CARE - Keep the wound area clean and dry.  Do not get this area wet for one week. No showers for one week; you may shower on  03/22/15   . - The tape/steri-strips on your wound will fall off; do not pull them off.  No bandage is needed on the site.  DO  NOT apply any creams, oils, or ointments to the wound area. - If you notice any drainage or discharge from the wound, any swelling or bruising at the site, or you develop a fever > 101? F after you are discharged home, call the office at once.  Special Instructions - You are still able to use cellular telephones; use the ear opposite the side where you have your pacemaker/defibrillator.  Avoid carrying your cellular phone near your device. - When traveling through airports, show security personnel your identification card to avoid being screened in the metal detectors.  Ask the security personnel to use the hand wand. - Avoid arc welding equipment, MRI testing (magnetic resonance imaging), TENS units (transcutaneous nerve stimulators).  Call the office for questions about other devices. - Avoid electrical appliances that are in poor condition or are not properly grounded. - Microwave ovens are safe to be near or to operate.  Additional  information for defibrillator patients should your device go off: - If your device goes off ONCE and you feel fine afterward, notify the device clinic nurses. - If your device goes off ONCE and you do not feel well afterward, call 911. - If your device goes off TWICE, call 911. - If your device goes off THREE times in one day, call 911.  DO NOT DRIVE YOURSELF OR A FAMILY MEMBER WITH A DEFIBRILLATOR TO THE HOSPITAL--CALL 911.

## 2016-03-16 ENCOUNTER — Encounter (HOSPITAL_COMMUNITY): Payer: Self-pay | Admitting: Internal Medicine

## 2016-03-16 NOTE — H&P (Signed)
ICD Criteria  Current LVEF:55%. Within 12 months prior to implant: No   Heart failure history: No  Cardiomyopathy history: No.  Atrial Fibrillation/Atrial Flutter: No.  Ventricular tachycardia history: No.  Cardiac arrest history: No.  History of syndromes with risk of sudden death: Yes, Brugada Syndrome  Previous ICD: Yes, Reason for ICD:  Primary prevention.  Current ICD indication: Primary  PPM indication: No.   Class I or II Bradycardia indication present: No  Beta Blocker therapy for 3 or more months: No, medical reason.  Ace Inhibitor/ARB therapy for 3 or more months: No, medical reason.

## 2016-03-18 LAB — TYPE AND SCREEN
BLOOD PRODUCT EXPIRATION DATE: 201801152359
BLOOD PRODUCT EXPIRATION DATE: 201801152359
Blood Product Expiration Date: 201801122359
Blood Product Expiration Date: 201801122359
ISSUE DATE / TIME: 201801020914
ISSUE DATE / TIME: 201801020914
UNIT TYPE AND RH: 5100
UNIT TYPE AND RH: 5100
UNIT TYPE AND RH: 5100
Unit Type and Rh: 5100

## 2016-03-20 ENCOUNTER — Other Ambulatory Visit: Payer: Self-pay | Admitting: Urgent Care

## 2016-03-20 DIAGNOSIS — E785 Hyperlipidemia, unspecified: Secondary | ICD-10-CM

## 2016-03-27 ENCOUNTER — Ambulatory Visit (INDEPENDENT_AMBULATORY_CARE_PROVIDER_SITE_OTHER): Payer: BLUE CROSS/BLUE SHIELD | Admitting: *Deleted

## 2016-03-27 DIAGNOSIS — Z95810 Presence of automatic (implantable) cardiac defibrillator: Secondary | ICD-10-CM

## 2016-03-27 NOTE — Progress Notes (Signed)
Wound check appointment. Steri-strips removed. Wound without redness. Incision edges approximated, wound well healed. Soft hematoma noted. Normal device function. Threshold, sensing, and impedances consistent with implant measurements. Device programmed at 3.5V for extra safety margin until 3 month visit. Histogram distribution appropriate for patient and level of activity. No mode switches or ventricular arrhythmias noted. Patient educated about wound care, arm mobility, lifting restrictions, shock plan. ROV in 3 months w/ GT.

## 2016-04-14 ENCOUNTER — Encounter (HOSPITAL_COMMUNITY): Payer: Self-pay | Admitting: Internal Medicine

## 2016-04-23 ENCOUNTER — Emergency Department (HOSPITAL_BASED_OUTPATIENT_CLINIC_OR_DEPARTMENT_OTHER)
Admit: 2016-04-23 | Discharge: 2016-04-23 | Disposition: A | Discharge status: Home / Self Care | Payer: BLUE CROSS/BLUE SHIELD | Attending: Emergency Medicine | Admitting: Emergency Medicine

## 2016-04-23 ENCOUNTER — Encounter (HOSPITAL_COMMUNITY): Payer: Self-pay | Admitting: Nurse Practitioner

## 2016-04-23 ENCOUNTER — Emergency Department (HOSPITAL_COMMUNITY): Payer: BLUE CROSS/BLUE SHIELD

## 2016-04-23 ENCOUNTER — Emergency Department (HOSPITAL_COMMUNITY)
Admission: EM | Admit: 2016-04-23 | Discharge: 2016-04-23 | Disposition: A | Discharge status: Home / Self Care | Payer: BLUE CROSS/BLUE SHIELD | Attending: Emergency Medicine | Admitting: Emergency Medicine

## 2016-04-23 DIAGNOSIS — M79609 Pain in unspecified limb: Secondary | ICD-10-CM

## 2016-04-23 DIAGNOSIS — M7989 Other specified soft tissue disorders: Secondary | ICD-10-CM

## 2016-04-23 DIAGNOSIS — M79602 Pain in left arm: Secondary | ICD-10-CM | POA: Diagnosis not present

## 2016-04-23 DIAGNOSIS — Z95810 Presence of automatic (implantable) cardiac defibrillator: Secondary | ICD-10-CM | POA: Diagnosis not present

## 2016-04-23 DIAGNOSIS — Z87891 Personal history of nicotine dependence: Secondary | ICD-10-CM | POA: Insufficient documentation

## 2016-04-23 HISTORY — DX: Acute embolism and thrombosis of unspecified deep veins of unspecified lower extremity: I82.409

## 2016-04-23 LAB — BASIC METABOLIC PANEL
Anion gap: 8 (ref 5–15)
BUN: 17 mg/dL (ref 6–20)
CALCIUM: 10.1 mg/dL (ref 8.9–10.3)
CO2: 28 mmol/L (ref 22–32)
Chloride: 103 mmol/L (ref 101–111)
Creatinine, Ser: 0.94 mg/dL (ref 0.61–1.24)
GFR calc Af Amer: >60 mL/min (ref >60)
Glucose, Bld: 101 mg/dL — ABNORMAL HIGH (ref 65–99)
POTASSIUM: 3.9 mmol/L (ref 3.5–5.1)
SODIUM: 139 mmol/L (ref 135–145)

## 2016-04-23 LAB — CBC
HEMATOCRIT: 41.3 % (ref 39.0–52.0)
Hemoglobin: 13.7 g/dL (ref 13.0–17.0)
MCH: 31 pg (ref 26.0–34.0)
MCHC: 33.2 g/dL (ref 30.0–36.0)
MCV: 93.4 fL (ref 78.0–100.0)
PLATELETS: 251 10*3/uL (ref 150–400)
RBC: 4.42 MIL/uL (ref 4.22–5.81)
RDW: 12.3 % (ref 11.5–15.5)
WBC: 7.7 10*3/uL (ref 4.0–10.5)

## 2016-04-23 LAB — I-STAT TROPONIN, ED: TROPONIN I, POC: 0 ng/mL (ref 0.00–0.08)

## 2016-04-23 NOTE — ED Notes (Signed)
Pt in Vascular US

## 2016-04-23 NOTE — ED Triage Notes (Addendum)
Pt presents with c/o L arm pain. The pain began about 3 days ago. The pain has been increasingly worse since onset and now is radiating into his L shoulder and L chest. He has noticed pain in his L hand too while working and the arms appear swollen to him. He had a blood clot in the arm in the past and these symptoms feel the same. He had a new pacemaker in january 2018.

## 2016-04-23 NOTE — ED Provider Notes (Signed)
[facility]-EMERGENCY DEPT Provider Note   CSN: [id number] Arrival date & time: 04/23/16  1749     History   Chief Complaint Chief Complaint  Patient presents with  . Arm Pain    HPI Chad [name] is a 46 y.o. male.  .-year-old male who presents with 3 days of increased swelling and pain to his left arm from midline cervical spine through fingertips.  He states that he has a previous history of DVT in that arm He had a pacemaker placed in the left axillary chest in January 2018.  He's had normal checkups.  Postoperatively He states that he is been lifting weights with both arms, but the left arm has or appears larger.      Past Medical History:  Diagnosis Date  . AICD (automatic cardioverter/defibrillator) present   . Brugada syndrome    s/p ICD 12/07  . DVT (deep venous thrombosis) (HCC)   . Dysrhythmia   . Hyperlipidemia   . Presence of permanent cardiac pacemaker   . Syncope    s/p ICD 12/07    Patient Active Problem List   Diagnosis Date Noted  . ICD (implantable cardioverter-defibrillator) in place 03/14/2016  . Seasonal allergies 11/25/2014  . Hyperlipidemia 06/17/2014  . DVT of axillary vein, chronic left (HCC) 04/15/2014  . Anxiety 04/15/2014  . Brugada syndrome 09/01/2012  . Automatic implantable cardioverter-defibrillator in situ 07/27/2009  . OTHER SPECIFIED CONGENITAL ANOMALY HEART OTHER 02/01/2009    Past Surgical History:  Procedure Laterality Date  . BACK SURGERY    . CARDIAC DEFIBRILLATOR PLACEMENT  02/2006   St. Jude Atlas  . FINGER TENDON REPAIR Left    2nd digit  . ICD GENERATOR CHANGE  03/14/2016   ICD GENERATOR REMOVAL AND INSERTION OF NEW ICD, WITH LEAD  REMOVAL EXTRACTION Hattie Perch 03/14/2016  . ICD LEAD REMOVAL N/A 03/14/2016   Procedure: ICD GENERATOR REMOVAL AND INSERTION OF NEW ICD, WITH LEAD  REMOVAL EXTRACTION;  Surgeon: Marinus Maw, MD;  Location: [facility] OR;  Service: Cardiovascular;  Laterality: N/A;  . LUMBAR DISC SURGERY  1997  . TEE  WITHOUT CARDIOVERSION N/A 03/14/2016   Procedure: TRANSESOPHAGEAL ECHOCARDIOGRAM (TEE);  Surgeon: Marinus Maw, MD;  Location: [facility] OR;  Service: Cardiovascular;  Laterality: N/A;       Home Medications    Prior to Admission medications   Medication Sig Start Date End Date Taking? Authorizing Provider  atorvastatin (LIPITOR) 20 MG tablet Take 1 tablet (20 mg total) by mouth daily at 6 PM. 03/21/16   Madelaine Bhat McVey, PA-C  omeprazole (PRILOSEC) 20 MG capsule Take 1 capsule (20 mg total) by mouth daily as needed (heartburn). 01/11/16   Madelaine Bhat McVey, PA-C    Family History Family History  Problem Relation Age of Onset  . Hyperlipidemia Mother   . Heart disease Mother   . Diabetes Mother     Social History Social History  Substance Use Topics  . Smoking status: Former Smoker    Packs/day: 1.00    Years: 23.00    Types: Cigarettes    Quit date: 03/14/2011  . Smokeless tobacco: Never Used  . Alcohol use 3.6 oz/week    6 Cans of beer per week     Allergies   Patient has no known allergies.   Review of Systems Review of Systems  Constitutional: Negative for fever.  Respiratory: Negative for shortness of breath.   Musculoskeletal: Negative for joint swelling.  Skin: Negative for wound.  Neurological: Negative for dizziness and  headaches.  All other systems reviewed and are negative.    Physical Exam Updated Vital Signs BP 123/83 (BP Location: Right Arm)   Pulse 64   Temp 98.4 F (36.9 C) (Oral)   Resp 18   Ht 5\' 7"  (1.702 m)   Wt 75.3 kg   SpO2 98%   BMI 26.00 kg/m   Physical Exam  Constitutional: He appears well-developed and well-nourished.  Eyes: Pupils are equal, round, and reactive to light.  Neck: Normal range of motion.  Cardiovascular: Normal rate.   Pulmonary/Chest: Effort normal.  Musculoskeletal: Normal range of motion. He exhibits tenderness.       Left shoulder: He exhibits swelling and pain. He exhibits normal range of motion.         Arms: Nursing note and vitals reviewed.    ED Treatments / Results  Labs (all labs ordered are listed, but only abnormal results are displayed) Labs Reviewed  BASIC METABOLIC PANEL - Abnormal; Notable for the following:       Result Value   Glucose, Bld 101 (*)    All other components within normal limits  CBC  I-STAT TROPOININ, ED    EKG  EKG Interpretation  Date/Time:  Sunday April 23 2016 18:12:37 EST Ventricular Rate:  57 PR Interval:  164 QRS Duration: 108 QT Interval:  420 QTC Calculation: 408 R Axis:   -22 Text Interpretation:  Sinus bradycardia Otherwise normal ECG No significant change since last tracing Confirmed by KNOTT MD, Reuel Boom ([id number]) on 04/23/2016 7:38:36 PM       Radiology Dg Chest 2 View  Result Date: 04/23/2016 CLINICAL DATA:  Left arm pain and swelling over the last 3 days. Left chest pain. Occasional shortness of breath. EXAM: CHEST  2 VIEW COMPARISON:  03/15/2016 FINDINGS: Heart size is normal. Single lead pacemaker/ AICD with tip in the region of the right ventricle. Mediastinal shadows otherwise normal. The lungs are clear. The vascularity is normal. Snap artifact projects over the right upper lobe. No effusions. No acute bone finding. IMPRESSION: No active disease.  Pacemaker/AICD. Electronically Signed   By: Paulina Fusi M.D.   On: 04/23/2016 19:04    Procedures Procedures (including critical care time)  Medications Ordered in ED Medications - No data to display   Initial Impression / Assessment and Plan / ED Course  I have reviewed the triage vital signs and the nursing notes.  Pertinent labs & imaging results that were available during my care of the patient were reviewed by me and considered in my medical decision making (see chart for details).     Ultrasound reveals no DVT  Final Clinical Impressions(s) / ED Diagnoses   Final diagnoses:  Left arm pain    New Prescriptions New Prescriptions   No medications on file      Earley Favor, NP 04/23/16 2012    Lyndal Pulley, MD 04/24/16 [phone]

## 2016-04-23 NOTE — Discharge Instructions (Signed)
Please purchase and wear a compression arm sleeve  Make an appointment with your PCP for follow up  Stop exercise for the next week

## 2016-04-23 NOTE — Progress Notes (Signed)
VASCULAR LAB PRELIMINARY  PRELIMINARY  PRELIMINARY  PRELIMINARY  Left upper extremity venous duplex completed.    Preliminary report:  There is no DVT or SVT noted in the left upper extremity.    Called report to Dr. Ronn Melena, [facility], RVT 04/23/2016, 7:16 PM

## 2016-04-26 ENCOUNTER — Telehealth: Payer: Self-pay | Admitting: Family Medicine

## 2016-04-26 ENCOUNTER — Ambulatory Visit (INDEPENDENT_AMBULATORY_CARE_PROVIDER_SITE_OTHER): Payer: BLUE CROSS/BLUE SHIELD

## 2016-04-26 ENCOUNTER — Ambulatory Visit (INDEPENDENT_AMBULATORY_CARE_PROVIDER_SITE_OTHER): Payer: BLUE CROSS/BLUE SHIELD | Admitting: Family Medicine

## 2016-04-26 VITALS — BP 122/72 | HR 56 | Temp 98.3°F | Resp 17 | Ht 68.5 in | Wt 171.0 lb

## 2016-04-26 DIAGNOSIS — M7989 Other specified soft tissue disorders: Secondary | ICD-10-CM

## 2016-04-26 LAB — POCT URINALYSIS DIP (MANUAL ENTRY)
BILIRUBIN UA: NEGATIVE
Blood, UA: NEGATIVE
Glucose, UA: NEGATIVE
Ketones, POC UA: NEGATIVE
LEUKOCYTES UA: NEGATIVE
NITRITE UA: NEGATIVE
PH UA: 7
PROTEIN UA: NEGATIVE
Spec Grav, UA: 1.015
UROBILINOGEN UA: 0.2

## 2016-04-26 LAB — POC MICROSCOPIC URINALYSIS (UMFC): Mucus: ABSENT

## 2016-04-26 MED ORDER — RIVAROXABAN (XARELTO) VTE STARTER PACK (15 & 20 MG): ORAL_TABLET | ORAL | 0 refills | Status: DC

## 2016-04-26 NOTE — Progress Notes (Signed)
Chad [name] is a 46 y.o. male who presents to [facility] at [facility] today for LUE swelling:  1.  Left arm swelling:  Patient with past history of DVT in left upper extremity 2 years ago and recent ICD pacemaker placement last month secondary to Brugada syndrome who presents with five-day history of left upper extremity edema. Started on Friday. Worse with movement. His pain increased to the point he presented to the emergency department on Sunday. Had negative upper extremity Doppler at that visit. No diagnosis that he can remember.  His swelling has somewhat improved but returns whenever he uses his arm. 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 This causes swelling in his forearm and arm.  Has noted some discoloration of the arm after activity.  He has full sensation in all of his fingers bilaterally. This is symmetric bilaterally. No weakness in his left arm. No paresthesias. No numbness.  Does have some pain when he is moving his arm. The swelling increases when he tries to raise his arm at the level of his shoulder above.  No injury to the arm.  ROS as above.   PMH reviewed. Patient is a nonsmoker.   Past Medical History:  Diagnosis Date  . AICD (automatic cardioverter/defibrillator) present   . Brugada syndrome    s/p ICD 12/07  . DVT (deep venous thrombosis) (HCC)   . Dysrhythmia   . Hyperlipidemia   . Presence of permanent cardiac pacemaker   . Syncope    s/p ICD 12/07   Past Surgical History:  Procedure Laterality Date  . BACK SURGERY    . CARDIAC DEFIBRILLATOR PLACEMENT  02/2006   St. Jude Atlas  . FINGER TENDON REPAIR Left    2nd digit  . ICD GENERATOR CHANGE  03/14/2016   ICD GENERATOR REMOVAL AND INSERTION OF NEW ICD, WITH LEAD  REMOVAL EXTRACTION Hattie Perch 03/14/2016  . ICD LEAD REMOVAL N/A 03/14/2016   Procedure: ICD GENERATOR REMOVAL AND INSERTION OF NEW ICD, WITH LEAD  REMOVAL EXTRACTION;  Surgeon: Marinus Maw, MD;  Location: [facility] OR;  Service:  Cardiovascular;  Laterality: N/A;  . LUMBAR DISC SURGERY  1997  . TEE WITHOUT CARDIOVERSION N/A 03/14/2016   Procedure: TRANSESOPHAGEAL ECHOCARDIOGRAM (TEE);  Surgeon: Marinus Maw, MD;  Location: [facility] OR;  Service: Cardiovascular;  Laterality: N/A;    Medications reviewed. Current Outpatient Prescriptions  Medication Sig Dispense Refill  . atorvastatin (LIPITOR) 20 MG tablet Take 1 tablet (20 mg total) by mouth daily at 6 PM. 90 tablet 1  . omeprazole (PRILOSEC) 20 MG capsule Take 1 capsule (20 mg total) by mouth daily as needed (heartburn). (Patient not taking: Reported on 04/26/2016) 30 capsule 6   No current facility-administered medications for this visit.      Physical Exam:  BP 122/72 (BP Location: Right Arm, Patient Position: Sitting, Cuff Size: Normal)   Pulse (!) 56   Temp 98.3 F (36.8 C) (Oral)   Resp 17   Ht 5' 8.5" (1.74 m)   Wt 171 lb (77.6 kg)   SpO2 97%   BMI 25.62 kg/m  Gen:  Alert, cooperative patient who appears stated age in no acute distress.  Vital signs reviewed. HEENT: EOMI,  MMM Neck:  Supple. No redness. No swelling or plethora noted Pulm:  Clear to auscultation bilaterally with good air movement.  No wheezes or rales noted.   Cardiac:  Regular rate and rhythm without murmur auscultated.  Good S1/S2. Skin:  No lesions noted.  ICD scar is well-healed. No redness around area. Exts: BL LE's are non-erythematous and nonedematous. -Right are within normal limits -Left arm: Does have about 1 cm in diameter increased circumference to forearm compared to the right. He does have some plethoric discoloration to his arm medicine the forearm and hand. Collateral veins are present entire left arm. He has +2 radial and on her pulses in the left hand. He has complete sensation without any numbness in all 5 digits of his left hand. Handgrip is 5 out of 5. Handgrip testing does not reproduce pain but lifting his arm above the level of the shoulder does reproduce  pain.  Assessment and Plan:  1.  Left arm swelling: - I am concerned for another acute DVT on LUE.  Has history of the same. - CXR did not show any migration of leads/wires of ICD.  The skin around his ICD looks great with no sign of infection. . -He does have collateral veins on examination. -Other consideration would be venous thoracic outlet syndrome as venous congestion worsens with movement of his arm, especially above his head.   -I did attempt to discuss the case with the vascular surgeon on call. However I was able to get through as he was in emergency surgery. -Plan will be to start patient on treatment with Xarelto for presumed acute upper extremity DVT. We will urgently refer him to vascular surgery for further input/recommendations and any testing for this. As noted he has complete sensation in his Left arm and no evidence of compartment syndrome on examination. He has great pulses bilaterally. No decreased sensation. No weakness.

## 2016-04-26 NOTE — Telephone Encounter (Signed)
Received call back from Dr. Darrick Penna.  Likely some component of vascular outlet obstruction.  Not an emergent issue.  He plans to see patient next Thursday.  I'll have our office call and set up an appointment for the patient next Thursday.    Called to discuss with patient.  Had to leave voicemail.  WIll continue anticoagulation until patient is seen by Vascular.  Will call patient tomorrow to ensure he received message.

## 2016-04-26 NOTE — Patient Instructions (Addendum)
Start the Xarelto.  This is the blood thinner.  You will take this twice daily for 21 days, then switch to 20 mg once daily.    I will give you a call later this evening with further instructions.  We are going to refer you either for imaging or to see a vascular surgeon.   If you start having worsening swelling or pain, come back immediately to see [place] or go to the ED.     IF you received an x-ray today, you will receive an invoice from [facility]. Please contact [facility] at [phone] with questions or concerns regarding your invoice.   IF you received labwork today, you will receive an invoice from [place]. Please contact [facility] at [phone] with questions or concerns regarding your invoice.   Our billing staff will not be able to assist you with questions regarding bills from these companies.  You will be contacted with the lab results as soon as they are available. The fastest way to get your results is to activate your My Chart account. Instructions are located on the last page of this paperwork. If you have not heard from [place] regarding the results in 2 weeks, please contact this office.

## 2016-04-27 NOTE — Telephone Encounter (Signed)
Patient schedule via office staff to be seen on 2/22.  Vascular office will contact patient with details.

## 2016-04-27 NOTE — Progress Notes (Signed)
Spoke with Britta Mccreedy at Dr. Darrick Penna office They will call patient with scheduled appointment to see Dr. Darrick Penna 05/04/16.

## 2016-04-28 ENCOUNTER — Telehealth: Payer: Self-pay | Admitting: Vascular Surgery

## 2016-04-28 NOTE — Telephone Encounter (Signed)
-----   Message from Sherren Kerns, MD sent at 04/27/2016  4:50 PM EST ----- Regarding: RE: Chad [name], Chad [name] Appt from DR [facility]  Left upper extremity venous Put him as early in the day as possible  Leonette Most ----- Message ----- From: Salvadore Oxford Sent: 04/27/2016   9:37 AM To: Sherren Kerns, MD Subject: Kerrin Champagne Appt from DR Nehemiah Massed                  A call came in from Dr Tyson Alias office wanting this pt to be seen for possible LUE DVT, states you spoke to Dr Gwendolyn Grant yesterday about this pt and agreed to see him on 2/22? Would you like a US done with this appt and if so which one? You are also full for that morning, do you want him added to a certain spot on the schedule?

## 2016-04-28 NOTE — Telephone Encounter (Signed)
pt was called by Interpretor line to leave mssg for appt, changed status to yes so one will come to the appt as well 2/22 [place] and OV

## 2016-05-02 ENCOUNTER — Other Ambulatory Visit: Payer: Self-pay | Admitting: *Deleted

## 2016-05-02 DIAGNOSIS — M79602 Pain in left arm: Secondary | ICD-10-CM

## 2016-05-04 ENCOUNTER — Encounter: Payer: Self-pay | Admitting: Vascular Surgery

## 2016-05-04 ENCOUNTER — Ambulatory Visit (HOSPITAL_COMMUNITY)
Admission: RE | Admit: 2016-05-04 | Discharge: 2016-05-04 | Disposition: A | Discharge status: Home / Self Care | Payer: BLUE CROSS/BLUE SHIELD | Source: Ambulatory Visit | Attending: Vascular Surgery | Admitting: Vascular Surgery

## 2016-05-04 ENCOUNTER — Ambulatory Visit (INDEPENDENT_AMBULATORY_CARE_PROVIDER_SITE_OTHER): Payer: BLUE CROSS/BLUE SHIELD | Admitting: Vascular Surgery

## 2016-05-04 ENCOUNTER — Other Ambulatory Visit: Payer: Self-pay

## 2016-05-04 ENCOUNTER — Other Ambulatory Visit: Payer: Self-pay | Admitting: *Deleted

## 2016-05-04 VITALS — BP 111/76 | HR 60 | Resp 20 | Ht 68.5 in | Wt 171.0 lb

## 2016-05-04 DIAGNOSIS — M79602 Pain in left arm: Secondary | ICD-10-CM | POA: Diagnosis not present

## 2016-05-04 DIAGNOSIS — I871 Compression of vein: Secondary | ICD-10-CM | POA: Diagnosis not present

## 2016-05-04 NOTE — Progress Notes (Signed)
Referring Physician: Payton Mccallum, MD  Patient name: Chad [name] MRN: [id number] DOB: 1970-06-16 Sex: male  REASON FOR CONSULT: Left arm swelling  HPI: Chad [name] is a 46 y.o. male with complaints of chronic left arm swelling. The patient has had an AICD in place for greater than 10 years. He has previously had a left arm DVT in the past. He had a new AICD placed in January by Dr. Ladona Ridgel. Ever since that time, the patient has had complaints of swelling on the left side of his face pain in the left shoulder and swelling in the left upper extremity. He denies any fever or chills. He states that in the past he had occasionally had some of the symptoms but these have become much worse since January. He states that his job is affected because it is painful to use his arm and opened pain symptoms that he has in the left side of his face and neck. Other medical problems include hyperlipidemia and Brugada syndrome both of which are been stable.  Past Medical History:  Diagnosis Date  . AICD (automatic cardioverter/defibrillator) present   . Brugada syndrome    s/p ICD 12/07  . DVT (deep venous thrombosis) (HCC)   . Dysrhythmia   . Hyperlipidemia   . Presence of permanent cardiac pacemaker   . Syncope    s/p ICD 12/07   Past Surgical History:  Procedure Laterality Date  . BACK SURGERY    . CARDIAC DEFIBRILLATOR PLACEMENT  02/2006   St. Jude Atlas  . FINGER TENDON REPAIR Left    2nd digit  . ICD GENERATOR CHANGE  03/14/2016   ICD GENERATOR REMOVAL AND INSERTION OF NEW ICD, WITH LEAD  REMOVAL EXTRACTION Hattie Perch 03/14/2016  . ICD LEAD REMOVAL N/A 03/14/2016   Procedure: ICD GENERATOR REMOVAL AND INSERTION OF NEW ICD, WITH LEAD  REMOVAL EXTRACTION;  Surgeon: Marinus Maw, MD;  Location: [facility] OR;  Service: Cardiovascular;  Laterality: N/A;  . LUMBAR DISC SURGERY  1997  . TEE WITHOUT CARDIOVERSION N/A 03/14/2016   Procedure: TRANSESOPHAGEAL ECHOCARDIOGRAM (TEE);  Surgeon: Marinus Maw, MD;  Location:  [facility] OR;  Service: Cardiovascular;  Laterality: N/A;    Family History  Problem Relation Age of Onset  . Hyperlipidemia Mother   . Heart disease Mother   . Diabetes Mother     SOCIAL HISTORY: Social History   Social History  . Marital status: Divorced    Spouse name: N/A  . Number of children: 2  . Years of education: GED   Occupational History  .  [employer]   Social History Main Topics  . Smoking status: Former Smoker    Packs/day: 1.00    Years: 23.00    Types: Cigarettes    Quit date: 03/14/2011  . Smokeless tobacco: Never Used  . Alcohol use 3.6 oz/week    6 Cans of beer per week  . Drug use: No  . Sexual activity: No   Other Topics Concern  . Not on file   Social History Narrative   Married    No Known Allergies  Current Outpatient Prescriptions  Medication Sig Dispense Refill  . atorvastatin (LIPITOR) 20 MG tablet Take 1 tablet (20 mg total) by mouth daily at 6 PM. 90 tablet 1  . Rivaroxaban 15 & 20 MG TBPK Take one 15mg  tablet by mouth twice a day with food. On Day 22, switch to one 20mg  tablet once a day with food.  51 each 0  . omeprazole (PRILOSEC) 20 MG capsule Take 1 capsule (20 mg total) by mouth daily as needed (heartburn). (Patient not taking: Reported on 04/26/2016) 30 capsule 6   No current facility-administered medications for this visit.     ROS:   General:  No weight loss, Fever, chills  HEENT: No recent headaches, no nasal bleeding, no visual changes, no sore throat  Neurologic: No dizziness, blackouts, seizures. No recent symptoms of stroke or mini- stroke. No recent episodes of slurred speech, or temporary blindness.  Cardiac: No recent episodes of chest pain/pressure, no shortness of breath at rest.  No shortness of breath with exertion.  Denies history of atrial fibrillation or irregular heartbeat  Vascular: No history of rest pain in feet.  No history of claudication.  No history of non-healing ulcer, No history of DVT     Pulmonary: No home oxygen, no productive cough, no hemoptysis,  No asthma or wheezing  Musculoskeletal:  [ ]  Arthritis, [ ]  Low back pain,  [ ]  Joint pain  Hematologic:No history of hypercoagulable state.  No history of easy bleeding.  No history of anemia  Gastrointestinal: No hematochezia or melena,  No gastroesophageal reflux, no trouble swallowing  Urinary: [ ]  chronic Kidney disease, [ ]  on HD - [ ]  MWF or [ ]  TTHS, [ ]  Burning with urination, [ ]  Frequent urination, [ ]  Difficulty urinating;   Skin: No rashes  Psychological: No history of anxiety,  No history of depression   Physical Examination  Vitals:   05/04/16 0927  BP: 111/76  Pulse: 60  Resp: 20  SpO2: 98%  Weight: 171 lb (77.6 kg)  Height: 5' 8.5" (1.74 m)    Body mass index is 25.62 kg/m.  General:  Alert and oriented, no acute distress HEENT: Normal Neck: No bruit or JVD Pulmonary: Clear to auscultation bilaterally Cardiac: Regular Rate and Rhythm without murmur, left sided AICD palpable no erythema Abdomen: Soft, non-tender, non-distended, no mass, no scars Skin: No rash, diffuse scattered chest wall and left base of neck collaterals Extremity Pulses:  2+ radial, brachial, femoral pulses bilaterally,  Musculoskeletal: No deformity, left upper extremity approximate 15-20% larger than the right upper extremity no ulceration Neurologic: Upper and lower extremity motor 5/5 and symmetric  DATA:  Patient had a venous duplex of his left upper extremity which showed no evidence of DVT.  ASSESSMENT:  Patient most likely has significant left central venous narrowing or occlusion. This is most likely secondary to chronic long-term pacer leads rather than a thoracic outlet syndrome. He has fairly disabling symptoms from this that keep him from doing all of his duties at work due to pain in the left neck chest and left upper extremity.   PLAN:  We will schedule the patient for bilateral central venogram to see  what options might be available to improve his symptoms. I have previously discussed the patient with Dr. Ladona Ridgel with options including removal of his AICD and going to a transcutaneous system although this obviously would be associated with possible morbidity. I will have further discussions with the patient after his central venogram. This is scheduled for next week. Risks benefits possible, occasions and procedure details a central venogram including contrast reaction renal dysfunction were discussed with the patient through his interpreter today.  [name], MD [facility] [place] Office: [phone] Pager: [phone]    [name], MD [facility] [place] Office: [phone] Pager: [phone]

## 2016-05-12 ENCOUNTER — Encounter (HOSPITAL_COMMUNITY): Payer: Self-pay | Admitting: Vascular Surgery

## 2016-05-12 ENCOUNTER — Other Ambulatory Visit: Payer: Self-pay | Admitting: *Deleted

## 2016-05-12 ENCOUNTER — Encounter (HOSPITAL_COMMUNITY)
Admission: RE | Disposition: A | Discharge status: Home / Self Care | Payer: Self-pay | Source: Ambulatory Visit | Attending: Vascular Surgery

## 2016-05-12 ENCOUNTER — Telehealth: Payer: Self-pay | Admitting: Vascular Surgery

## 2016-05-12 ENCOUNTER — Telehealth: Payer: Self-pay | Admitting: Internal Medicine

## 2016-05-12 ENCOUNTER — Ambulatory Visit (HOSPITAL_COMMUNITY)
Admission: RE | Admit: 2016-05-12 | Discharge: 2016-05-12 | Disposition: A | Discharge status: Home / Self Care | Payer: BLUE CROSS/BLUE SHIELD | Source: Ambulatory Visit | Attending: Vascular Surgery | Admitting: Vascular Surgery

## 2016-05-12 DIAGNOSIS — Z8249 Family history of ischemic heart disease and other diseases of the circulatory system: Secondary | ICD-10-CM | POA: Insufficient documentation

## 2016-05-12 DIAGNOSIS — R609 Edema, unspecified: Secondary | ICD-10-CM | POA: Diagnosis not present

## 2016-05-12 DIAGNOSIS — I82B22 Chronic embolism and thrombosis of left subclavian vein: Secondary | ICD-10-CM | POA: Diagnosis not present

## 2016-05-12 DIAGNOSIS — M7989 Other specified soft tissue disorders: Secondary | ICD-10-CM | POA: Diagnosis present

## 2016-05-12 DIAGNOSIS — Z833 Family history of diabetes mellitus: Secondary | ICD-10-CM | POA: Insufficient documentation

## 2016-05-12 DIAGNOSIS — Z87891 Personal history of nicotine dependence: Secondary | ICD-10-CM | POA: Diagnosis not present

## 2016-05-12 DIAGNOSIS — E785 Hyperlipidemia, unspecified: Secondary | ICD-10-CM | POA: Diagnosis not present

## 2016-05-12 DIAGNOSIS — Z95810 Presence of automatic (implantable) cardiac defibrillator: Secondary | ICD-10-CM | POA: Insufficient documentation

## 2016-05-12 DIAGNOSIS — I498 Other specified cardiac arrhythmias: Secondary | ICD-10-CM | POA: Insufficient documentation

## 2016-05-12 HISTORY — PX: UPPER EXTREMITY VENOGRAPHY: CATH118272

## 2016-05-12 LAB — POCT I-STAT, CHEM 8
BUN: 13 mg/dL (ref 6–20)
CREATININE: 0.8 mg/dL (ref 0.61–1.24)
Calcium, Ion: 1.19 mmol/L (ref 1.15–1.40)
Chloride: 105 mmol/L (ref 101–111)
Glucose, Bld: 103 mg/dL — ABNORMAL HIGH (ref 65–99)
HEMATOCRIT: 46 % (ref 39.0–52.0)
HEMOGLOBIN: 15.6 g/dL (ref 13.0–17.0)
POTASSIUM: 3.7 mmol/L (ref 3.5–5.1)
SODIUM: 141 mmol/L (ref 135–145)
TCO2: 27 mmol/L (ref 0–100)

## 2016-05-12 SURGERY — UPPER EXTREMITY VENOGRAPHY
Anesthesia: LOCAL | Laterality: Bilateral

## 2016-05-12 MED ORDER — ONDANSETRON HCL 4 MG/2ML IJ SOLN: 4.0000 mg | Freq: Four times a day (QID) | INTRAMUSCULAR | Status: DC | PRN

## 2016-05-12 MED ORDER — LABETALOL HCL 5 MG/ML IV SOLN: 10.0000 mg | INTRAVENOUS | Status: DC | PRN

## 2016-05-12 MED ORDER — METOPROLOL TARTRATE 5 MG/5ML IV SOLN: 2.0000 mg | INTRAVENOUS | Status: DC | PRN

## 2016-05-12 MED ORDER — ACETAMINOPHEN 325 MG RE SUPP
325.0000 mg | RECTAL | Status: DC | PRN
  Filled 2016-05-12: qty 2

## 2016-05-12 MED ORDER — SODIUM CHLORIDE 0.9 % IV SOLN
INTRAVENOUS | Status: DC
Start: 2016-05-12 — End: 2016-05-12
  Administered 2016-05-12: 08:00:00 via INTRAVENOUS

## 2016-05-12 MED ORDER — ACETAMINOPHEN 325 MG PO TABS
325.0000 mg | ORAL_TABLET | ORAL | Status: DC | PRN
  Filled 2016-05-12: qty 2

## 2016-05-12 MED ORDER — HYDRALAZINE HCL 20 MG/ML IJ SOLN: 5.0000 mg | INTRAMUSCULAR | Status: DC | PRN

## 2016-05-12 SURGICAL SUPPLY — 2 items
STOPCOCK MORSE 400PSI 3WAY (MISCELLANEOUS) ×2 IMPLANT
TUBING CIL FLEX 10 FLL-RA (TUBING) ×2 IMPLANT

## 2016-05-12 NOTE — Interval H&P Note (Signed)
History and Physical Interval Note:  05/12/2016 11:17 AM  Chad [name]  has presented today for surgery, with the diagnosis of left upper extremity swelling  The various methods of treatment have been discussed with the patient and family. After consideration of risks, benefits and other options for treatment, the patient has consented to  Procedure(s): Central Venography (Bilateral) as a surgical intervention .  The patient's history has been reviewed, patient examined, no change in status, stable for surgery.  I have reviewed the patient's chart and labs.  Questions were answered to the patient's satisfaction.     [name]

## 2016-05-12 NOTE — Op Note (Addendum)
Procedure: Bilateral central venogram  Preoperative diagnosis: Left arm swelling  Postoperative diagnosis: Same  Anesthesia: None  Operative findings: #1 chronic occlusion left subclavian vein abundant collaterals primarily from cephalic vein and also some from the deep brachial vein with reconstitution of the distal subclavian vein.  #2 widely patent right central venous system as well as origins of internal jugulars  Operative details: After obtaining informed consent, the patient was brought to the [facility] lab. The patient was placed in supine position the Angio table. The patient had previously had antecubital IVs placed in the holding area. These were both accessed. Right upper extremity central venogram was performed to the right antecubital IV. This shows a widely patent brachial cephalic axillary subclavian origin of the right internal jugular and superior vena cava.  Next the left upper extremity IV was accessed. Injection was performed through this. The left cephalic and brachial veins are widely patent. The cephalic vein fills much more quickly and fills a large number of collaterals at the level of the subclavian vein. There is a 2-3 cm section of subclavian vein that is occluded and then reconstitutes via collaterals. The origin of the left internal jugular vein is visualized. The remainder of the subclavian vein as it dumps into the innominate vein and superior vena cava is widely patent.  Operative management: The patient was discussed preoperatively with Dr. Sharrell Ku who manages the patient's AICD. His preference is for observation for now and allowing collaterals to continue to develop to see if symptoms improve. If the patient's symptoms do not improve long-term consideration could be given for reconstruction of his left central venous system. The patient will follow-up with me on an as-needed basis if Dr. Ladona Ridgel thinks his symptoms indicate this. There is no indication for  anticoagulation.  [name], MD [facility] [place] Office: [phone] Pager: [phone]

## 2016-05-12 NOTE — H&P (View-Only) (Signed)
Referring Physician: Payton Mccallum, MD  Patient name: Chad [name] MRN: [id number] DOB: 1970-06-16 Sex: male  REASON FOR CONSULT: Left arm swelling  HPI: Chad [name] is a 46 y.o. male with complaints of chronic left arm swelling. The patient has had an AICD in place for greater than 10 years. He has previously had a left arm DVT in the past. He had a new AICD placed in January by Dr. Ladona Ridgel. Ever since that time, the patient has had complaints of swelling on the left side of his face pain in the left shoulder and swelling in the left upper extremity. He denies any fever or chills. He states that in the past he had occasionally had some of the symptoms but these have become much worse since January. He states that his job is affected because it is painful to use his arm and opened pain symptoms that he has in the left side of his face and neck. Other medical problems include hyperlipidemia and Brugada syndrome both of which are been stable.  Past Medical History:  Diagnosis Date  . AICD (automatic cardioverter/defibrillator) present   . Brugada syndrome    s/p ICD 12/07  . DVT (deep venous thrombosis) (HCC)   . Dysrhythmia   . Hyperlipidemia   . Presence of permanent cardiac pacemaker   . Syncope    s/p ICD 12/07   Past Surgical History:  Procedure Laterality Date  . BACK SURGERY    . CARDIAC DEFIBRILLATOR PLACEMENT  02/2006   St. Jude Atlas  . FINGER TENDON REPAIR Left    2nd digit  . ICD GENERATOR CHANGE  03/14/2016   ICD GENERATOR REMOVAL AND INSERTION OF NEW ICD, WITH LEAD  REMOVAL EXTRACTION Hattie Perch 03/14/2016  . ICD LEAD REMOVAL N/A 03/14/2016   Procedure: ICD GENERATOR REMOVAL AND INSERTION OF NEW ICD, WITH LEAD  REMOVAL EXTRACTION;  Surgeon: Marinus Maw, MD;  Location: [facility] OR;  Service: Cardiovascular;  Laterality: N/A;  . LUMBAR DISC SURGERY  1997  . TEE WITHOUT CARDIOVERSION N/A 03/14/2016   Procedure: TRANSESOPHAGEAL ECHOCARDIOGRAM (TEE);  Surgeon: Marinus Maw, MD;  Location:  [facility] OR;  Service: Cardiovascular;  Laterality: N/A;    Family History  Problem Relation Age of Onset  . Hyperlipidemia Mother   . Heart disease Mother   . Diabetes Mother     SOCIAL HISTORY: Social History   Social History  . Marital status: Divorced    Spouse name: N/A  . Number of children: 2  . Years of education: GED   Occupational History  .  [employer]   Social History Main Topics  . Smoking status: Former Smoker    Packs/day: 1.00    Years: 23.00    Types: Cigarettes    Quit date: 03/14/2011  . Smokeless tobacco: Never Used  . Alcohol use 3.6 oz/week    6 Cans of beer per week  . Drug use: No  . Sexual activity: No   Other Topics Concern  . Not on file   Social History Narrative   Married    No Known Allergies  Current Outpatient Prescriptions  Medication Sig Dispense Refill  . atorvastatin (LIPITOR) 20 MG tablet Take 1 tablet (20 mg total) by mouth daily at 6 PM. 90 tablet 1  . Rivaroxaban 15 & 20 MG TBPK Take one 15mg  tablet by mouth twice a day with food. On Day 22, switch to one 20mg  tablet once a day with food.  51 each 0  . omeprazole (PRILOSEC) 20 MG capsule Take 1 capsule (20 mg total) by mouth daily as needed (heartburn). (Patient not taking: Reported on 04/26/2016) 30 capsule 6   No current facility-administered medications for this visit.     ROS:   General:  No weight loss, Fever, chills  HEENT: No recent headaches, no nasal bleeding, no visual changes, no sore throat  Neurologic: No dizziness, blackouts, seizures. No recent symptoms of stroke or mini- stroke. No recent episodes of slurred speech, or temporary blindness.  Cardiac: No recent episodes of chest pain/pressure, no shortness of breath at rest.  No shortness of breath with exertion.  Denies history of atrial fibrillation or irregular heartbeat  Vascular: No history of rest pain in feet.  No history of claudication.  No history of non-healing ulcer, No history of DVT     Pulmonary: No home oxygen, no productive cough, no hemoptysis,  No asthma or wheezing  Musculoskeletal:  [ ]  Arthritis, [ ]  Low back pain,  [ ]  Joint pain  Hematologic:No history of hypercoagulable state.  No history of easy bleeding.  No history of anemia  Gastrointestinal: No hematochezia or melena,  No gastroesophageal reflux, no trouble swallowing  Urinary: [ ]  chronic Kidney disease, [ ]  on HD - [ ]  MWF or [ ]  TTHS, [ ]  Burning with urination, [ ]  Frequent urination, [ ]  Difficulty urinating;   Skin: No rashes  Psychological: No history of anxiety,  No history of depression   Physical Examination  Vitals:   05/04/16 0927  BP: 111/76  Pulse: 60  Resp: 20  SpO2: 98%  Weight: 171 lb (77.6 kg)  Height: 5' 8.5" (1.74 m)    Body mass index is 25.62 kg/m.  General:  Alert and oriented, no acute distress HEENT: Normal Neck: No bruit or JVD Pulmonary: Clear to auscultation bilaterally Cardiac: Regular Rate and Rhythm without murmur, left sided AICD palpable no erythema Abdomen: Soft, non-tender, non-distended, no mass, no scars Skin: No rash, diffuse scattered chest wall and left base of neck collaterals Extremity Pulses:  2+ radial, brachial, femoral pulses bilaterally,  Musculoskeletal: No deformity, left upper extremity approximate 15-20% larger than the right upper extremity no ulceration Neurologic: Upper and lower extremity motor 5/5 and symmetric  DATA:  Patient had a venous duplex of his left upper extremity which showed no evidence of DVT.  ASSESSMENT:  Patient most likely has significant left central venous narrowing or occlusion. This is most likely secondary to chronic long-term pacer leads rather than a thoracic outlet syndrome. He has fairly disabling symptoms from this that keep him from doing all of his duties at work due to pain in the left neck chest and left upper extremity.   PLAN:  We will schedule the patient for bilateral central venogram to see  what options might be available to improve his symptoms. I have previously discussed the patient with Dr. Ladona Ridgel with options including removal of his AICD and going to a transcutaneous system although this obviously would be associated with possible morbidity. I will have further discussions with the patient after his central venogram. This is scheduled for next week. Risks benefits possible, occasions and procedure details a central venogram including contrast reaction renal dysfunction were discussed with the patient through his interpreter today.  [name], MD [facility] [place] Office: [phone] Pager: [phone]    [name], MD [facility] [place] Office: [phone] Pager: [phone]

## 2016-05-12 NOTE — Telephone Encounter (Signed)
Sched appt w/ Dr. Ladona Ridgel 05/16/16 at 4:00. Lm w/ emergency contact to inform them of appt.

## 2016-05-12 NOTE — Discharge Instructions (Signed)
Venogram, Care After °This sheet gives you information about how to care for yourself after your procedure. Your health care provider may also give you more specific instructions. If you have problems or questions, contact your health care provider. °What can I expect after the procedure? °After the procedure, it is common to have: °· Bruising or mild discomfort in the area where the IV was inserted (insertion site). ° °Follow these instructions at home: °Eating and drinking °· Follow instructions from your health care provider about eating or drinking restrictions. °· Drink a lot of fluids for the first several days after the procedure, as directed by your health care provider. This helps to wash (flush) the contrast out of your body. Examples of healthy fluids include water or low-calorie drinks. °General instructions °· Check your IV insertion area every day for signs of infection. Check for: °? Redness, swelling, or pain. °? Fluid or blood. °? Warmth. °? Pus or a bad smell. °· Take over-the-counter and prescription medicines only as told by your health care provider. °· Rest and return to your normal activities as told by your health care provider. Ask your health care provider what activities are safe for you. °· Do not drive for 24 hours if you were given a medicine to help you relax (sedative), or until your health care provider approves. °· Keep all follow-up visits as told by your health care provider. This is important. °Contact a health care provider if: °· Your skin becomes itchy or you develop a rash or hives. °· You have a fever that does not get better with medicine. °· You feel nauseous. °· You vomit. °· You have redness, swelling, or pain around the insertion site. °· You have fluid or blood coming from the insertion site. °· Your insertion area feels warm to the touch. °· You have pus or a bad smell coming from the insertion site. °Get help right away if: °· You have difficulty breathing or  shortness of breath. °· You develop chest pain. °· You faint. °· You feel very dizzy. °These symptoms may represent a serious problem that is an emergency. Do not wait to see if the symptoms will go away. Get medical help right away. Call your local emergency services (911 in the [place] Do not drive yourself to the hospital. °Summary °· After your procedure, it is common to have bruising or mild discomfort in the area where the IV was inserted. °· You should check your IV insertion area every day for signs of infection. °· Take over-the-counter and prescription medicines only as told by your health care provider. °· You should drink a lot of fluids for the first several days after the procedure to help flush the contrast from your body. °This information is not intended to replace advice given to you by your health care provider. Make sure you discuss any questions you have with your health care provider. °Document Released: 12/18/2012 Document Revised: 01/22/2016 Document Reviewed: 01/22/2016 °Elsevier Interactive Patient Education © 2017 [employer] Inc[employer] ° °

## 2016-05-12 NOTE — Telephone Encounter (Signed)
Called, spoke with pt. Informed Dr. Ladona Ridgel recommended pt keep his arm elevated and taking OTC Ibuprofen as directed for pain. F/u visit next week. Made appt 05/12/16 at 10:15 AM, arriving at 10:00 AM. Informed to call our offcie if symptoms get worse. Pt verbalized understanding and agreed with plan.

## 2016-05-12 NOTE — Telephone Encounter (Signed)
Dr. Darrick Penna office called to make appt for pt asap for arm pain-pt just had Pacer placed and not due for fu until April-I checked with Ladona Ridgel as he has been speaking to Dr. Darrick Penna about this. Per Ladona Ridgel pt already knows what  o do and doesn't need to see him until scheduled time-pt states still  having pain -pls call

## 2016-05-12 NOTE — Telephone Encounter (Signed)
-----   Message from Sharee Pimple, RN sent at 05/12/2016 12:51 PM EST ----- Regarding: Appt w/ Dr. Ladona Ridgel Patient may already have an appt w/ Dr. Ladona Ridgel, established patient of his, has AICD implanted ----- Message ----- From: Sherren Kerns, MD Sent: 05/12/2016   8:57 AM To: [facility] Charge Pool  Bilateral central venogram Follow up PRN with me.  He needs appt with Dr Sharrell Ku in the next few weeks.  Please arrange this and contact patient  [name]

## 2016-05-16 ENCOUNTER — Ambulatory Visit (INDEPENDENT_AMBULATORY_CARE_PROVIDER_SITE_OTHER): Payer: BLUE CROSS/BLUE SHIELD | Admitting: Internal Medicine

## 2016-05-16 ENCOUNTER — Encounter: Payer: Self-pay | Admitting: Internal Medicine

## 2016-05-16 DIAGNOSIS — I498 Other specified cardiac arrhythmias: Secondary | ICD-10-CM

## 2016-05-16 LAB — CUP PACEART INCLINIC DEVICE CHECK
Brady Statistic RV Percent Paced: 0 %
Date Time Interrogation Session: 20180306150552
HIGH POWER IMPEDANCE MEASURED VALUE: 78.75 Ohm
Implantable Lead Implant Date: 20180102
Implantable Lead Model: 7122
Implantable Pulse Generator Implant Date: 20180102
Lead Channel Impedance Value: 700 Ohm
Lead Channel Pacing Threshold Amplitude: 0.75 V
Lead Channel Pacing Threshold Amplitude: 0.75 V
Lead Channel Pacing Threshold Pulse Width: 0.5 ms
Lead Channel Sensing Intrinsic Amplitude: 11.8 mV
Lead Channel Setting Sensing Sensitivity: 0.5 mV
MDC IDC LEAD LOCATION: 753860
MDC IDC MSMT LEADCHNL RV PACING THRESHOLD PULSEWIDTH: 0.5 ms
MDC IDC SET LEADCHNL RV PACING AMPLITUDE: 2.5 V
MDC IDC SET LEADCHNL RV PACING PULSEWIDTH: 0.5 ms
Pulse Gen Serial Number: 1246759

## 2016-05-16 NOTE — Patient Instructions (Addendum)
Medication Instructions:  May take Ibuprofen or Tylenol as direct for pain.   Labwork: None Ordered   Testing/Procedures: None Ordered   Follow-Up: Your physician recommends that you schedule a follow-up appointment in: 2 months with Dr. Ladona Ridgel (cancel appointment on 06/13/16).       Any Other Special Instructions Will Be Listed Below (If Applicable). Elevate left arm above level of heart.    If you need a refill on your cardiac medications before your next appointment, please call your pharmacy.

## 2016-05-16 NOTE — Progress Notes (Signed)
HPI Mr. Chad [name] returns today for followup. He is a very pleasant 46 year-old man with a history of syncope and EKG consistent with Brugada syndrome, who underwent insertion of a prophylactic defibrillator in 2007. Since then, he has done well although he developed a left upper extremity DVT and was placed on anti-coagulation.He had PMVT and when he was last seen noted to have a broken ICD lead and underwent extraction of his 46 year old device and insertion of a new ICD. He has had left arm swelling and had a venogram last week which demonstrated an occluded left SCV and collaterals. The right sided veins were widely patent. He has had swelling in the left arm which is a bit better but not back to normal. He also had some pain around the neck, left shoulder and left side of the head.  No Known Allergies   Current Outpatient Prescriptions  Medication Sig Dispense Refill  . atorvastatin (LIPITOR) 20 MG tablet Take 1 tablet (20 mg total) by mouth daily at 6 PM. 90 tablet 1   No current facility-administered medications for this visit.      Past Medical History:  Diagnosis Date  . AICD (automatic cardioverter/defibrillator) present   . Brugada syndrome    s/p ICD 12/07  . DVT (deep venous thrombosis) (HCC)   . Dysrhythmia   . Hyperlipidemia   . Presence of permanent cardiac pacemaker   . Syncope    s/p ICD 12/07    ROS:   All systems reviewed and negative except as noted in the HPI.   Past Surgical History:  Procedure Laterality Date  . BACK SURGERY    . CARDIAC DEFIBRILLATOR PLACEMENT  02/2006   St. Jude Atlas  . FINGER TENDON REPAIR Left    2nd digit  . ICD GENERATOR CHANGE  03/14/2016   ICD GENERATOR REMOVAL AND INSERTION OF NEW ICD, WITH LEAD  REMOVAL EXTRACTION Hattie Perch 03/14/2016  . ICD LEAD REMOVAL N/A 03/14/2016   Procedure: ICD GENERATOR REMOVAL AND INSERTION OF NEW ICD, WITH LEAD  REMOVAL EXTRACTION;  Surgeon: Marinus Maw, MD;  Location: [facility] OR;  Service: Cardiovascular;   Laterality: N/A;  . LUMBAR DISC SURGERY  1997  . TEE WITHOUT CARDIOVERSION N/A 03/14/2016   Procedure: TRANSESOPHAGEAL ECHOCARDIOGRAM (TEE);  Surgeon: Marinus Maw, MD;  Location: [facility] OR;  Service: Cardiovascular;  Laterality: N/A;  . UPPER EXTREMITY VENOGRAPHY Bilateral 05/12/2016   Procedure: Central Venography;  Surgeon: Sherren Kerns, MD;  Location: [facility] INVASIVE CV LAB;  Service: Cardiovascular;  Laterality: Bilateral;     Family History  Problem Relation Age of Onset  . Hyperlipidemia Mother   . Heart disease Mother   . Diabetes Mother      Social History   Social History  . Marital status: Divorced    Spouse name: N/A  . Number of children: 2  . Years of education: GED   Occupational History  .  [employer]   Social History Main Topics  . Smoking status: Former Smoker    Packs/day: 1.00    Years: 23.00    Types: Cigarettes    Quit date: 03/14/2011  . Smokeless tobacco: Never Used  . Alcohol use 3.6 oz/week    6 Cans of beer per week  . Drug use: No  . Sexual activity: No   Other Topics Concern  . Not on file   Social History Narrative   Married     BP 108/82  Pulse 69   Ht 5\' 8"  (1.727 m)   Wt 169 lb (76.7 kg)   SpO2 97%   BMI 25.70 kg/m   Physical Exam:  Well appearing 46 year old man, NAD HEENT: Unremarkable Neck:  6 cm JVD, no thyromegally Lungs:  Clear with no wheezes, rales, or rhonchi. Well-healed ICD incision HEART:  Regular rate rhythm, no murmurs, no rubs, no clicks Abd:  soft, positive bowel sounds, no organomegally, no rebound, no guarding Ext:  2 plus pulses, no edema, no cyanosis, no clubbing Skin:  No rashes no nodules Neuro:  CN II through XII intact, motor grossly intact  DEVICE  Normal device function.  See PaceArt for details.   Assess/Plan:  1. Brugada pattern on ECG and syncope - he has had no arrhythmias in 10 years until this past May. In light of his long episode of PMVT, he has undergone a new ICD  generator and lead.  2. ICD - he is s/p insertion of a new device. He will continue his usual followup 3. Left SCV occlusion - his vein is occluded. I would expect that his left arm swelling will improve with time as collaterals form.  [name].D.

## 2016-06-13 ENCOUNTER — Encounter: Payer: BLUE CROSS/BLUE SHIELD | Admitting: Internal Medicine

## 2016-07-18 ENCOUNTER — Encounter: Payer: Self-pay | Admitting: Internal Medicine

## 2016-07-18 ENCOUNTER — Ambulatory Visit (INDEPENDENT_AMBULATORY_CARE_PROVIDER_SITE_OTHER): Payer: BLUE CROSS/BLUE SHIELD | Admitting: Internal Medicine

## 2016-07-18 VITALS — BP 120/78 | HR 69 | Ht 67.0 in | Wt 173.2 lb

## 2016-07-18 DIAGNOSIS — I498 Other specified cardiac arrhythmias: Secondary | ICD-10-CM

## 2016-07-18 DIAGNOSIS — Z95810 Presence of automatic (implantable) cardiac defibrillator: Secondary | ICD-10-CM | POA: Diagnosis not present

## 2016-07-18 LAB — CUP PACEART INCLINIC DEVICE CHECK
Date Time Interrogation Session: 20180508104417
HighPow Impedance: 74.25 Ohm
Implantable Lead Location: 753860
Implantable Lead Model: 7122
Implantable Pulse Generator Implant Date: 20180102
Lead Channel Impedance Value: 575 Ohm
Lead Channel Setting Pacing Amplitude: 2.5 V
MDC IDC LEAD IMPLANT DT: 20180102
MDC IDC MSMT LEADCHNL RV PACING THRESHOLD AMPLITUDE: 0.75 V
MDC IDC MSMT LEADCHNL RV PACING THRESHOLD AMPLITUDE: 0.75 V
MDC IDC MSMT LEADCHNL RV PACING THRESHOLD PULSEWIDTH: 0.5 ms
MDC IDC MSMT LEADCHNL RV PACING THRESHOLD PULSEWIDTH: 0.5 ms
MDC IDC MSMT LEADCHNL RV SENSING INTR AMPL: 11.1 mV
MDC IDC SET LEADCHNL RV PACING PULSEWIDTH: 0.5 ms
MDC IDC SET LEADCHNL RV SENSING SENSITIVITY: 0.5 mV
MDC IDC STAT BRADY RV PERCENT PACED: 0 %
Pulse Gen Serial Number: 1246759

## 2016-07-18 MED ORDER — OMEPRAZOLE 20 MG PO CPDR: 20.0000 mg | DELAYED_RELEASE_CAPSULE | Freq: Every day | ORAL | 3 refills | Status: DC

## 2016-07-18 NOTE — Progress Notes (Signed)
HPI Mr. Chad [name] returns today for followup. He is a very pleasant 46 year-old man with a history of syncope and EKG consistent with Brugada syndrome, who underwent insertion of a prophylactic defibrillator in 2007 who underwent extraction of a broken lead with re-insertion of a new device. Since then, he has done well although he developed a left upper extremity DVT and was placed on anti-coagulation.  He has taken 3 months of this and has had a marked improvement in his left arm swelling.    No Known Allergies   Current Outpatient Prescriptions  Medication Sig Dispense Refill  . atorvastatin (LIPITOR) 20 MG tablet Take 1 tablet (20 mg total) by mouth daily at 6 PM. 90 tablet 1   No current facility-administered medications for this visit.      Past Medical History:  Diagnosis Date  . AICD (automatic cardioverter/defibrillator) present   . Brugada syndrome    s/p ICD 12/07  . DVT (deep venous thrombosis) (HCC)   . Dysrhythmia   . Hyperlipidemia   . Presence of permanent cardiac pacemaker   . Syncope    s/p ICD 12/07    ROS:   All systems reviewed and negative except as noted in the HPI.   Past Surgical History:  Procedure Laterality Date  . BACK SURGERY    . CARDIAC DEFIBRILLATOR PLACEMENT  02/2006   St. Jude Atlas  . FINGER TENDON REPAIR Left    2nd digit  . ICD GENERATOR CHANGE  03/14/2016   ICD GENERATOR REMOVAL AND INSERTION OF NEW ICD, WITH LEAD  REMOVAL EXTRACTION Hattie Perch 03/14/2016  . ICD LEAD REMOVAL N/A 03/14/2016   Procedure: ICD GENERATOR REMOVAL AND INSERTION OF NEW ICD, WITH LEAD  REMOVAL EXTRACTION;  Surgeon: Marinus Maw, MD;  Location: [facility] OR;  Service: Cardiovascular;  Laterality: N/A;  . LUMBAR DISC SURGERY  1997  . TEE WITHOUT CARDIOVERSION N/A 03/14/2016   Procedure: TRANSESOPHAGEAL ECHOCARDIOGRAM (TEE);  Surgeon: Marinus Maw, MD;  Location: [facility] OR;  Service: Cardiovascular;  Laterality: N/A;  . UPPER EXTREMITY VENOGRAPHY Bilateral 05/12/2016   Procedure:  Central Venography;  Surgeon: Sherren Kerns, MD;  Location: [facility] INVASIVE CV LAB;  Service: Cardiovascular;  Laterality: Bilateral;     Family History  Problem Relation Age of Onset  . Hyperlipidemia Mother   . Heart disease Mother   . Diabetes Mother      Social History   Social History  . Marital status: Divorced    Spouse name: N/A  . Number of children: 2  . Years of education: GED   Occupational History  .  [employer]   Social History Main Topics  . Smoking status: Former Smoker    Packs/day: 1.00    Years: 23.00    Types: Cigarettes    Quit date: 03/14/2011  . Smokeless tobacco: Never Used  . Alcohol use 3.6 oz/week    6 Cans of beer per week  . Drug use: No  . Sexual activity: No   Other Topics Concern  . Not on file   Social History Narrative   Married     BP 120/78   Pulse 69   Ht 5\' 7"  (1.702 m)   Wt 173 lb 3.2 oz (78.6 kg)   SpO2 96%   BMI 27.13 kg/m   Physical Exam:  Well appearing 46 year old man, NAD HEENT: Unremarkable Neck:  6 cm JVD, no thyromegally Lungs:  Clear with no wheezes, rales, or rhonchi. Well-healed  ICD incision HEART:  Regular rate rhythm, no murmurs, no rubs, no clicks Abd:  soft, positive bowel sounds, no organomegally, no rebound, no guarding Ext:  2 plus pulses, no edema, no cyanosis, no clubbing, left arm is slightly larger than the right arm. Skin:  No rashes no nodules Neuro:  CN II through XII intact, motor grossly intact  DEVICE  Normal device function.  See PaceArt for details.   Assess/Plan:  1. Brugada pattern on ECG and syncope - he has had no arrhythmias in 10 years until this past May. In light of his long episode of PMVT, he has undergone a new ICD generator and lead.  2. ICD - he is s/p insertion of a new device. He will continue his usual followup 3. Left SCV occlusion - his swelling is largely resolved. He is off of his anti-coagulation.   [name].D.

## 2016-07-18 NOTE — Patient Instructions (Signed)
Medication Instructions:  START omeprazole 20 mg daily  Labwork: None ordered  Testing/Procedures: None ordered  Follow-Up: Remote monitoring is used to monitor your ICD from home. This monitoring reduces the number of office visits required to check your device to one time per year. It allows [place] to keep an eye on the functioning of your device to ensure it is working properly. You are scheduled for a device check from home on 10/17/16. You may send your transmission at any time that day. If you have a wireless device, the transmission will be sent automatically. After your physician reviews your transmission, you will receive a postcard with your next transmission date.  Your physician wants you to follow-up in: January with Dr. Ladona Ridgel. You will receive a reminder letter in the mail two months in advance. If you don't receive a letter, please call our office to schedule the follow-up appointment.    Any Other Special Instructions Will Be Listed Below (If Applicable).     If you need a refill on your cardiac medications before your next appointment, please call your pharmacy.

## 2016-10-17 ENCOUNTER — Ambulatory Visit (INDEPENDENT_AMBULATORY_CARE_PROVIDER_SITE_OTHER): Payer: BLUE CROSS/BLUE SHIELD | Admitting: *Deleted

## 2016-10-17 ENCOUNTER — Telehealth: Payer: Self-pay | Admitting: Cardiology

## 2016-10-17 DIAGNOSIS — I498 Other specified cardiac arrhythmias: Secondary | ICD-10-CM | POA: Diagnosis not present

## 2016-10-17 NOTE — Telephone Encounter (Signed)
Spoke with pt and reminded pt of remote transmission that is due today. Pt verbalized understanding.   

## 2016-10-18 ENCOUNTER — Encounter: Payer: Self-pay | Admitting: Cardiology

## 2016-10-18 NOTE — Progress Notes (Signed)
Remote ICD transmission.   

## 2016-10-24 LAB — CUP PACEART REMOTE DEVICE CHECK
Battery Voltage: 3.16 V
Brady Statistic RV Percent Paced: 1 %
HighPow Impedance: 81 Ohm
HighPow Impedance: 81 Ohm
Implantable Lead Implant Date: 20180102
Implantable Lead Location: 753860
Lead Channel Pacing Threshold Amplitude: 0.75 V
Lead Channel Sensing Intrinsic Amplitude: 11.8 mV
Lead Channel Setting Pacing Amplitude: 2.5 V
Lead Channel Setting Sensing Sensitivity: 0.5 mV
MDC IDC MSMT BATTERY REMAINING LONGEVITY: 96 mo
MDC IDC MSMT BATTERY REMAINING PERCENTAGE: 93 %
MDC IDC MSMT LEADCHNL RV IMPEDANCE VALUE: 540 Ohm
MDC IDC MSMT LEADCHNL RV PACING THRESHOLD PULSEWIDTH: 0.5 ms
MDC IDC PG IMPLANT DT: 20180102
MDC IDC SESS DTM: 20180808030919
MDC IDC SET LEADCHNL RV PACING PULSEWIDTH: 0.5 ms
Pulse Gen Serial Number: 1246759

## 2016-12-20 ENCOUNTER — Encounter: Payer: BLUE CROSS/BLUE SHIELD | Admitting: Physician Assistant

## 2016-12-20 ENCOUNTER — Telehealth: Payer: Self-pay | Admitting: Internal Medicine

## 2016-12-20 ENCOUNTER — Ambulatory Visit (INDEPENDENT_AMBULATORY_CARE_PROVIDER_SITE_OTHER): Payer: BLUE CROSS/BLUE SHIELD | Admitting: Physician Assistant

## 2016-12-20 ENCOUNTER — Encounter: Payer: Self-pay | Admitting: Physician Assistant

## 2016-12-20 VITALS — BP 116/88 | HR 58 | Temp 97.7°F | Resp 16 | Ht 67.0 in | Wt 166.8 lb

## 2016-12-20 DIAGNOSIS — R634 Abnormal weight loss: Secondary | ICD-10-CM | POA: Diagnosis not present

## 2016-12-20 DIAGNOSIS — Z23 Encounter for immunization: Secondary | ICD-10-CM

## 2016-12-20 LAB — GLUCOSE, POCT (MANUAL RESULT ENTRY): POC Glucose: 80 mg/dl (ref 70–99)

## 2016-12-20 NOTE — Telephone Encounter (Signed)
New Message     Pt is having numbness in left arm and hand since he had new device put in.

## 2016-12-20 NOTE — Progress Notes (Signed)
PRIMARY CARE AT [facility] [address] [place] [address] [phone]  Date:  12/20/2016   Name:  Chad [name]   DOB:  Aug 02, 1970   MRN:  [id number]  PCP:  Tobey Grim, MD    History of Present Illness:  Chad [name] is a 46 y.o. male patient who presents to PCP with  Chief Complaint  Patient presents with  . Headache  . Dizziness    states he has been very dizzy in last months  . Weight Loss    has lost over 10 lbs in last few weeks     Noticed that he has had weight loss.  He has noted no change in his diet.  He is eating well.  He is exercising at the gym twice per week.  He is walking, running and lifting weights as he generally does.   He has noticed added fatigue.   No history of chest pain or d He noted a headache last week all day.  This had resolved, but comes and goes faintly.   No history of thyroid disease in his family.   No history of diabetes or glucose intolerance.  He will have dizziness intermittently throughout the day for 1 week.  It generally occurs when he stands.   He feels like his thigh is smaller than his other leg.   He denies any recent stressors.   Former smoker couple years.   No coughing, but notices some fatigue.   Unprotected sex with one partner Beer 5 drinks per day when he drinks.   He does endorse bloody stool.  No abdominal pain.  No constipatino or diarrhea.  The last time was about 1 month ago from what he knows. He states that he does see a GI doctor and saw the individual today.  He can not recall the name.   Wt Readings from Last 3 Encounters:  12/20/16 166 lb 12.8 oz (75.7 kg)  07/18/16 173 lb 3.2 oz (78.6 kg)  05/16/16 169 lb (76.7 kg)   ' Patient Active Problem List   Diagnosis Date Noted  . ICD (implantable cardioverter-defibrillator) in place 03/14/2016  . Seasonal allergies 11/25/2014  . Hyperlipidemia 06/17/2014  . DVT of axillary vein, chronic left (HCC) 04/15/2014  . Anxiety 04/15/2014  . Brugada syndrome  09/01/2012  . Automatic implantable cardioverter-defibrillator in situ 07/27/2009  . OTHER SPECIFIED CONGENITAL ANOMALY HEART OTHER 02/01/2009    Past Medical History:  Diagnosis Date  . AICD (automatic cardioverter/defibrillator) present   . Brugada syndrome    s/p ICD 12/07  . DVT (deep venous thrombosis) (HCC)   . Dysrhythmia   . Hyperlipidemia   . Presence of permanent cardiac pacemaker   . Syncope    s/p ICD 12/07    Past Surgical History:  Procedure Laterality Date  . BACK SURGERY    . CARDIAC DEFIBRILLATOR PLACEMENT  02/2006   St. Jude Atlas  . FINGER TENDON REPAIR Left    2nd digit  . ICD GENERATOR CHANGE  03/14/2016   ICD GENERATOR REMOVAL AND INSERTION OF NEW ICD, WITH LEAD  REMOVAL EXTRACTION Hattie Perch 03/14/2016  . ICD LEAD REMOVAL N/A 03/14/2016   Procedure: ICD GENERATOR REMOVAL AND INSERTION OF NEW ICD, WITH LEAD  REMOVAL EXTRACTION;  Surgeon: Marinus Maw, MD;  Location: [facility] OR;  Service: Cardiovascular;  Laterality: N/A;  . LUMBAR DISC SURGERY  1997  . TEE WITHOUT CARDIOVERSION N/A 03/14/2016   Procedure: TRANSESOPHAGEAL ECHOCARDIOGRAM (TEE);  Surgeon: Marinus Maw,  MD;  Location: [facility] OR;  Service: Cardiovascular;  Laterality: N/A;  . UPPER EXTREMITY VENOGRAPHY Bilateral 05/12/2016   Procedure: Central Venography;  Surgeon: Sherren Kerns, MD;  Location: [facility] INVASIVE CV LAB;  Service: Cardiovascular;  Laterality: Bilateral;    Social History  Substance Use Topics  . Smoking status: Former Smoker    Packs/day: 1.00    Years: 23.00    Types: Cigarettes    Quit date: 03/14/2011  . Smokeless tobacco: Never Used  . Alcohol use 3.6 oz/week    6 Cans of beer per week    Family History  Problem Relation Age of Onset  . Hyperlipidemia Mother   . Heart disease Mother   . Diabetes Mother     No Known Allergies  Medication list has been reviewed and updated.  Current Outpatient Prescriptions on File Prior to Visit  Medication Sig Dispense Refill  . atorvastatin  (LIPITOR) 20 MG tablet Take 1 tablet (20 mg total) by mouth daily at 6 PM. 90 tablet 1  . omeprazole (PRILOSEC) 20 MG capsule Take 1 capsule (20 mg total) by mouth daily. 90 capsule 3   No current facility-administered medications on file prior to visit.     ROS ROS otherwise unremarkable unless listed above.  Physical Examination: BP 116/88   Pulse (!) 58   Temp 97.7 F (36.5 C) (Oral)   Resp 16   Ht  (1.702 m)   Wt 166 lb 12.8 oz (75.7 kg)   SpO2 99%   BMI 26.12 kg/m  Ideal Body Weight: Weight in (lb) to have BMI = 25: 159.3  Physical Exam  Constitutional: He is oriented to person, place, and time. He appears well-developed and well-nourished. No distress.  HENT:  Head: Normocephalic and atraumatic.  Eyes: Pupils are equal, round, and reactive to light. Conjunctivae and EOM are normal.  Neck: No thyroid mass and no thyromegaly present.  Cardiovascular: Normal rate, regular rhythm and normal heart sounds.  Exam reveals no friction rub.   No murmur heard. Pulmonary/Chest: Effort normal. No respiratory distress. He has no wheezes.  Lymphadenopathy:       Head (right side): No submandibular and no tonsillar adenopathy present.       Head (left side): No submandibular and no tonsillar adenopathy present.    He has no axillary adenopathy.       Right: No supraclavicular adenopathy present.       Left: No supraclavicular adenopathy present.  Neurological: He is alert and oriented to person, place, and time.  Skin: Skin is warm and dry. Capillary refill takes less than 2 seconds. No bruising and no rash noted. He is not diaphoretic.  Psychiatric: He has a normal mood and affect. His behavior is normal.   Wt Readings from Last 3 Encounters:  12/20/16 166 lb 12.8 oz (75.7 kg)  07/18/16 173 lb 3.2 oz (78.6 kg)  05/16/16 169 lb (76.7 kg)     Results for orders placed or performed in visit on 12/20/16  POCT glucose (manual entry)  Result Value Ref Range   POC Glucose 80  70 - 99 mg/dl   Orthostatic VS for the past 24 hrs (Last 3 readings):  BP- Lying Pulse- Lying BP- Sitting Pulse- Sitting BP- Standing at 0 minutes Pulse- Standing at 0 minutes  12/20/16 1152 117/80 61 122/83 71 130/89 68   Ekg: is no Assessment and Plan: Tobby Toro is a 46 y.o. male who is here today for cc of  Chief  Complaint  Patient presents with  . Headache  . Dizziness    states he has been very dizzy in last months  . Weight Loss    has lost over 10 lbs in last few weeks  possible that continuance of the edema is resolving of patient following his procedure, but labs obtained. Follow up pending labs Loss of weight - Plan: Thyroid Panel With TSH, POCT glucose (manual entry), CBC, Comprehensive metabolic panel, Orthostatic vital signs, Hemoglobin A1c, Sedimentation Rate, POC Hemoccult Bld/Stl (1-Cd Office Dx), IFOBT POC (occult bld, rslt in office), EKG 12-Lead  Need for influenza vaccination - Plan: Flu Vaccine QUAD 6+ mos PF IM (Fluarix Quad PF), Hemoglobin A1c, Sedimentation Rate  Trena Platt, PA-C [facility] 10/10/20181:03 PM

## 2016-12-20 NOTE — Patient Instructions (Addendum)
I will contact you with the lab results, and we will follow up with that.   Please make sure you are hydrating with 64 oz of water every day. Eat fruits and vegetable   Chng m?t (Dizziness) Chng m?t l m?t v?n ?? ph? bi?n. ? l c?m gic khng ?n ??nh ho?c chong vng. Qu v? c th? c?m th?y nh? s?p ng?t. Chng m?t c th? d?n ??n ch?n th??ng n?u qu v? v?p ho?c t ng. B?t k? ai c?ng c th? b? chng m?t nh?ng chng m?t ph? bi?n h?n ? ng??i l?n. Tnh tr?ng ny c th? do m?t s? nguyn nhn, bao g?m dng thu?c, b? m?t n??c, ho?c b? ?m. H??NG D?N CH?M [place] T?I NH Th?c hi?n nh?ng b??c sau c th? gip gi?m tnh tr?ng ny. ?n v u?ng  U?ng ?? n??c ?? gi? cho n??c ti?u trong ho?c c mu vng nh?t. Vi?c ny gi? cho c? th? qu v? khng b? m?t n??c. C? g?ng u?ng nhi?u dung d?ch trong su?t, ch?ng h?n nh? n??c.  Khng u?ng r??u.  H?n ch? dng caffein n?u c ch? d?n c?a chuyn gia ch?m [place] s?c kh?e.  H?n ch? dng mu?i n?u c ch? d?n c?a chuyn gia ch?m [place] s?c kh?e. Ho?t ??ng  Trnh cc ??ng tc nhanh. ? T? ??ng d?y th?t ch?m v v?ng ch?c cho ??n khi qu v? c?m th?y khng c v?n ?? g. ? Vo bu?i sng, ??u tin l ng?i d?y ? bn c?nh gi??ng. Khi qu v? c?m th?y ?n ? t? th? ny, hy ??ng d?y ch?m d?y trong khi n?m l?y m?t ci g ? cho ??n khi qu v? th?y th?ng b?ng.  C? ??ng chn th??ng xuyn n?u qu v? c?n ??ng ? m?t n?i trong m?t th?i gian di. Co v th? gin cc c? b?p ? chn qu v? khi ??ng.  Khng li xe ho?c v?n hnh my mc n?ng n?u qu v? c?m th?y chng m?t.  Trnh ci ng??i n?u qu v? c?m th?y chng m?t. ?? cc v?t d?ng trong nh sao cho c th? d? dng v?i t?i chng m khng c?n ci ng??i. L?i s?ng  Khng s? d?ng b?t c? cc s?n ph?m thu?c l no, bao g?m thu?c l d?ng ht, thu?c l d?ng nhai ho?c thu?c l ?i?n t?. N?u qu v? c?n gip ?? ?? cai thu?c, hy h?i chuyn gia ch?m [place] s?c kh?e.  C? g?ng gi?m c?ng th?ng, ch?ng h?n nh? t?p yoga ho?c thi?n. Ni v?i chuyn gia ch?m [place] s?c kh?e  n?u qu v? c?n gip ??. H??ng d?n chung  Theo di tnh tr?ng chng m?t c?a qu v? ?? pht hi?n b?t k? thay ??i no.  Ch? s? d?ng thu?c theo ch? d?n c?a chuyn gia ch?m [place] s?c kh?e. Ni chuy?n v?i chuyn gia ch?m [place] s?c kh?e n?u qu v? ngh? r?ng tnh tr?ng chng m?t l do vi?c qu v? dng thu?c gy ra.  Ni v?i m?t ng??i b?n ho?c m?t ng??i trong gia ?nh r?ng qu v? ?ang b? chng m?t. N?u h? nh?n th?y qu v? c thay ??i hnh vi, hy b?o h? g?i cho chuyn gia ch?m [place] s?c kh?e.  Tun th? t?t c? cc cu?c h?n khm l?i theo ch? d?n c?a chuyn gia ch?m [place] s?c kh?e. ?i?u ny c vai tr quan tr?ng. ?I KHM N?U:  Tnh tr?ng chng m?t khng m?t ?i.  Qu v? b? hoa m?t ho?c chng m?t n?ng h?n.  Qu v? c?m th?y  bu?n nn.  Qu v? b? gi?m thnh l?c.  Qu v? c cc tri?u ch?ng m?i.  Qu v? khng ??ng v?ng ???c ho?c qu v? c?m th?y nh? c?n phng ?ang quay.  NGAY L?P T?C ?I KHM N?U:  Qu v? nn ho?c tiu ch?y v khng th? ?n ho?c u?ng ???c th? g.  Qu v? g?p kh kh?n trong vi?c ni, ?i b?, nu?t ho?c s? d?ng cnh tay, bn tay ho?c chn.  Qu v? c?m th?y y?u ton thn.  Qu v? suy ngh? khng r rng ho?c kh ??t cu. M?t ng??i b?n ho?c ng??i trong gia ?nh c th? nh?n th?y ?i?u ny.  Qu v? b? ?au ng?c, ?au b?ng, kh th? ho?c v m? hi.  Th? l?c c?a qu v? thay ??i.  Qu v? th?y b?t k? ch? ch?y mu no.  Qu v? b? ?au ??u.  Qu v? b? ?au c? ho?c c? c?ng.  Qu v? b? s?t.  Thng tin ny khng nh?m m?c ?ch thay th? cho l?i khuyn m chuyn gia ch?m [place] s?c kh?e ni v?i qu v?. Hy b?o ??m qu v? ph?i th?o lu?n b?t k? v?n ?? g m qu v? c v?i chuyn gia ch?m [place] s?c kh?e c?a qu v?. Document Released: 02/16/2011 Document Revised: 07/14/2014 Document Reviewed: 02/23/2014 Elsevier Interactive Patient Education  2017 Elsevier Inc.     IF you received an x-ray today, you will receive an invoice from [facility]. Please contact [facility] at [phone] with  questions or concerns regarding your invoice.   IF you received labwork today, you will receive an invoice from [place]. Please contact [facility] at [phone] with questions or concerns regarding your invoice.   Our billing staff will not be able to assist you with questions regarding bills from these companies.  You will be contacted with the lab results as soon as they are available. The fastest way to get your results is to activate your My Chart account. Instructions are located on the last page of this paperwork. If you have not heard from [place] regarding the results in 2 weeks, please contact this office.

## 2016-12-20 NOTE — Telephone Encounter (Signed)
Returned call to Pt.  Pt with scheduled PCP visit today that he cancelled.  Pt states PCP could not help him with his arm.  Per review of Pt chart, Pt has history of clotting to left arm since his device implanted.  Pt states numbness has gotten worse.  Pt believes that device is causing the numbness.  Pt has appt to see Renee 12/27/2016 to address numbness.  Pt wants this nurse to discuss with Dr. Ladona Ridgel.  Will follow up and return Pt call on Friday.  Pt indicates understanding.

## 2016-12-21 LAB — THYROID PANEL WITH TSH
FREE THYROXINE INDEX: 1 — AB (ref 1.2–4.9)
T3 Uptake Ratio: 29 % (ref 24–39)
T4, Total: 3.4 ug/dL — ABNORMAL LOW (ref 4.5–12.0)
TSH: 0.826 u[IU]/mL (ref 0.450–4.500)

## 2016-12-21 LAB — COMPREHENSIVE METABOLIC PANEL
ALBUMIN: 5 g/dL (ref 3.5–5.5)
ALK PHOS: 61 IU/L (ref 39–117)
ALT: 54 IU/L — ABNORMAL HIGH (ref 0–44)
AST: 32 IU/L (ref 0–40)
Albumin/Globulin Ratio: 1.8 (ref 1.2–2.2)
BUN / CREAT RATIO: 20 (ref 9–20)
BUN: 16 mg/dL (ref 6–24)
Bilirubin Total: 0.6 mg/dL (ref 0.0–1.2)
CALCIUM: 10.1 mg/dL (ref 8.7–10.2)
CO2: 26 mmol/L (ref 20–29)
Chloride: 100 mmol/L (ref 96–106)
Creatinine, Ser: 0.82 mg/dL (ref 0.76–1.27)
GFR calc Af Amer: 123 mL/min/{1.73_m2} (ref >59)
GFR, EST NON AFRICAN AMERICAN: 106 mL/min/{1.73_m2} (ref >59)
GLOBULIN, TOTAL: 2.8 g/dL (ref 1.5–4.5)
GLUCOSE: 94 mg/dL (ref 65–99)
Potassium: 4.8 mmol/L (ref 3.5–5.2)
SODIUM: 141 mmol/L (ref 134–144)
TOTAL PROTEIN: 7.8 g/dL (ref 6.0–8.5)

## 2016-12-21 LAB — CBC
HEMATOCRIT: 45.6 % (ref 37.5–51.0)
Hemoglobin: 15.3 g/dL (ref 13.0–17.7)
MCH: 31.8 pg (ref 26.6–33.0)
MCHC: 33.6 g/dL (ref 31.5–35.7)
MCV: 95 fL (ref 79–97)
PLATELETS: 327 10*3/uL (ref 150–379)
RBC: 4.81 x10E6/uL (ref 4.14–5.80)
RDW: 12.9 % (ref 12.3–15.4)
WBC: 10.8 10*3/uL (ref 3.4–10.8)

## 2016-12-21 LAB — HEMOGLOBIN A1C
Est. average glucose Bld gHb Est-mCnc: 126 mg/dL
HEMOGLOBIN A1C: 6 % — AB (ref 4.8–5.6)

## 2016-12-21 LAB — SEDIMENTATION RATE: Sed Rate: 6 mm/hr (ref 0–15)

## 2016-12-22 NOTE — Telephone Encounter (Signed)
Returned Pt call.  Notified Pt that per Dr. Ladona Ridgel "should get better".  Advised Pt to keep appt with Renee on 12/27/2016.  Notified Pt to go to ER if arm becomes painful.  Pt indicates understanding.  Confirms appt with Renee.

## 2016-12-25 NOTE — Progress Notes (Deleted)
Cardiology Office Note Date:  12/25/2016  Patient ID:  Chad [name], Chad [name] [birth year], MRN [id number] PCP:  Tobey Grim, MD  Electrophysiologist:  Dr. Ladona Ridgel  ***refresh   Chief Complaint: arm numbess  History of Present Illness: Chad [name] is a 46 y.o. male with history of syncope, Brugada Syndrome w/ICD implant, HLD   *** vascular evaluation *** meds *** symptoms *** a/c??   Device information SJM Implanted 2007, lead fracture, system extracted, new lead/ICD 03/14/16, Dr. Ladona Ridgel Lead extraction new lead implant complicated with LUE DVT treated with a/c x 29mo  05/12/16, Dr. Darrick Penna, venogram Operative findings:  #1 chronic occlusion left subclavian vein abundant collaterals primarily from cephalic vein and also some from the deep brachial vein with reconstitution of the distal subclavian vein. #2 widely patent right central venous system as well as origins of internal jugulars Operative management: The patient was discussed preoperatively with Dr. Sharrell Ku who manages the patient's AICD. His preference is for observation for now and allowing collaterals to continue to develop to see if symptoms improve. If the patient's symptoms do not improve long-term consideration could be given for reconstruction of his left central venous system. The patient will follow-up with me on an as-needed basis if Dr. Ladona Ridgel thinks his symptoms indicate this. There is no indication for anticoagulation.  Past Medical History:  Diagnosis Date  . AICD (automatic cardioverter/defibrillator) present   . Brugada syndrome    s/p ICD 12/07  . DVT (deep venous thrombosis) (HCC)   . Dysrhythmia   . Hyperlipidemia   . Presence of permanent cardiac pacemaker   . Syncope    s/p ICD 12/07    Past Surgical History:  Procedure Laterality Date  . BACK SURGERY    . CARDIAC DEFIBRILLATOR PLACEMENT  02/2006   St. Jude Atlas  . FINGER TENDON REPAIR Left    2nd digit  . ICD GENERATOR CHANGE  03/14/2016   ICD  GENERATOR REMOVAL AND INSERTION OF NEW ICD, WITH LEAD  REMOVAL EXTRACTION Hattie Perch 03/14/2016  . ICD LEAD REMOVAL N/A 03/14/2016   Procedure: ICD GENERATOR REMOVAL AND INSERTION OF NEW ICD, WITH LEAD  REMOVAL EXTRACTION;  Surgeon: Marinus Maw, MD;  [facility] OR;  Service: Cardiovascular;  Laterality: N/A;  . LUMBAR DISC SURGERY  1997  . TEE WITHOUT CARDIOVERSION N/A 03/14/2016   Procedure: TRANSESOPHAGEAL ECHOCARDIOGRAM (TEE);  Surgeon: Marinus Maw, MD;  Location: [facility] OR;  Service: Cardiovascular;  Laterality: N/A;  . UPPER EXTREMITY VENOGRAPHY Bilateral 05/12/2016   Procedure: Central Venography;  Surgeon: Sherren Kerns, MD;  Location: [facility] INVASIVE CV LAB;  Service: Cardiovascular;  Laterality: Bilateral;    Current Outpatient Prescriptions  Medication Sig Dispense Refill  . atorvastatin (LIPITOR) 20 MG tablet Take 1 tablet (20 mg total) by mouth daily at 6 PM. 90 tablet 1  . omeprazole (PRILOSEC) 20 MG capsule Take 1 capsule (20 mg total) by mouth daily. 90 capsule 3   No current facility-administered medications for this visit.     Allergies:   Patient has no known allergies.   Social History:  The patient  reports that he quit smoking about 5 years ago. His smoking use included Cigarettes. He has a 23.00 pack-year smoking history. He has never used smokeless tobacco. He reports that he drinks about 3.6 oz of alcohol per week . He reports that he does not use drugs.   Family History:  The patient's family history includes Diabetes in his mother; Heart disease in his mother;  Hyperlipidemia in his mother.  ROS:  Please see the history of present illness.    All other systems are reviewed and otherwise negative.   PHYSICAL EXAM: *** VS:  There were no vitals taken for this visit. BMI: There is no height or weight on file to calculate BMI. Well nourished, well developed, in no acute distress  HEENT: normocephalic, atraumatic  Neck: no JVD, carotid bruits or masses Cardiac:  *** RRR; no  significant murmurs, no rubs, or gallops Lungs:  *** CTA b/l, no wheezing, rhonchi or rales  Abd: soft, nontender MS: no deformity or *** atrophy Ext: *** no edema  Skin: warm and dry, no rash Neuro:  No gross deficits appreciated Psych: euthymic mood, full affect  *** ICD site is stable, no tethering or discomfort   EKG:   *** ICD interrogation done today and reviewed by myself: ***  02/15/06: TTE SUMMARY - Overall left ventricular systolic function was normal. Left    ventricular ejection fraction was estimated to be 65 %. There    were no left ventricular regional wall motion abnormalities. - Normal aortic valve - Normal mitral valve - Normal tricuspid valve  Recent Labs: 12/20/2016: ALT 54; BUN 16; Creatinine, Ser 0.82; Hemoglobin 15.3; Platelets 327; Potassium 4.8; Sodium 141; TSH 0.826  01/11/2016: Cholesterol 174; HDL 47; LDL Cholesterol 83; Total CHOL/HDL Ratio 3.7; Triglycerides 222; VLDL 44   Estimated Creatinine Clearance: 105.2 mL/min (by C-G formula based on SCr of 0.82 mg/dL).   Wt Readings from Last 3 Encounters:  12/20/16 166 lb 12.8 oz (75.7 kg)  07/18/16 173 lb 3.2 oz (78.6 kg)  05/16/16 169 lb (76.7 kg)     Other studies reviewed: Additional studies/records reviewed today include: summarized above  ASSESSMENT AND PLAN:   1. Brugada Syndrome w/ICD     ***  2. Chronic occlusion of LSV     ***   Disposition: F/u with ***  Current medicines are reviewed at length with the patient today.  The patient did not have any concerns regarding medicines.***  Signed, Francis Dowse, PA-C 12/25/2016 8:27 PM     [facility] [address] [place] [address] [phone] (office)  [phone] (fax)

## 2016-12-27 ENCOUNTER — Encounter: Payer: BLUE CROSS/BLUE SHIELD | Admitting: Physician Assistant

## 2016-12-28 ENCOUNTER — Telehealth: Payer: Self-pay | Admitting: *Deleted

## 2016-12-28 NOTE — Telephone Encounter (Signed)
Spoke with [place] interpreter, pt advised of labs and return in 2wks to 1 month if symptoms worsen.

## 2016-12-29 ENCOUNTER — Encounter: Payer: Self-pay | Admitting: Physician Assistant

## 2016-12-29 ENCOUNTER — Telehealth: Payer: Self-pay | Admitting: Physician Assistant

## 2016-12-29 NOTE — Telephone Encounter (Signed)
Pt forgot to tell English when he was seen recently that when he hold something cold his hand itches   Best number [phone]

## 2017-01-03 ENCOUNTER — Encounter: Payer: Self-pay | Admitting: Physician Assistant

## 2017-01-03 DIAGNOSIS — K648 Other hemorrhoids: Secondary | ICD-10-CM | POA: Insufficient documentation

## 2017-01-04 NOTE — Telephone Encounter (Signed)
Forwarded pt msg to [employer] PA

## 2017-01-04 NOTE — Telephone Encounter (Signed)
Thank you for that information.

## 2017-01-16 ENCOUNTER — Encounter: Payer: BLUE CROSS/BLUE SHIELD | Admitting: *Deleted

## 2017-01-16 ENCOUNTER — Telehealth: Payer: Self-pay | Admitting: Cardiology

## 2017-01-16 NOTE — Telephone Encounter (Signed)
LMOVM reminding pt to send remote transmission.   

## 2017-01-19 ENCOUNTER — Encounter: Payer: Self-pay | Admitting: Cardiology

## 2017-05-06 IMAGING — DX DG NECK SOFT TISSUE
2 series · 2 of 2 positions shown · non-contrast
Comparison: None in PACs

CLINICAL DATA: Left-sided neck pain and swelling

EXAM:
NECK SOFT TISSUES - 1+ VIEW

[neck lat]
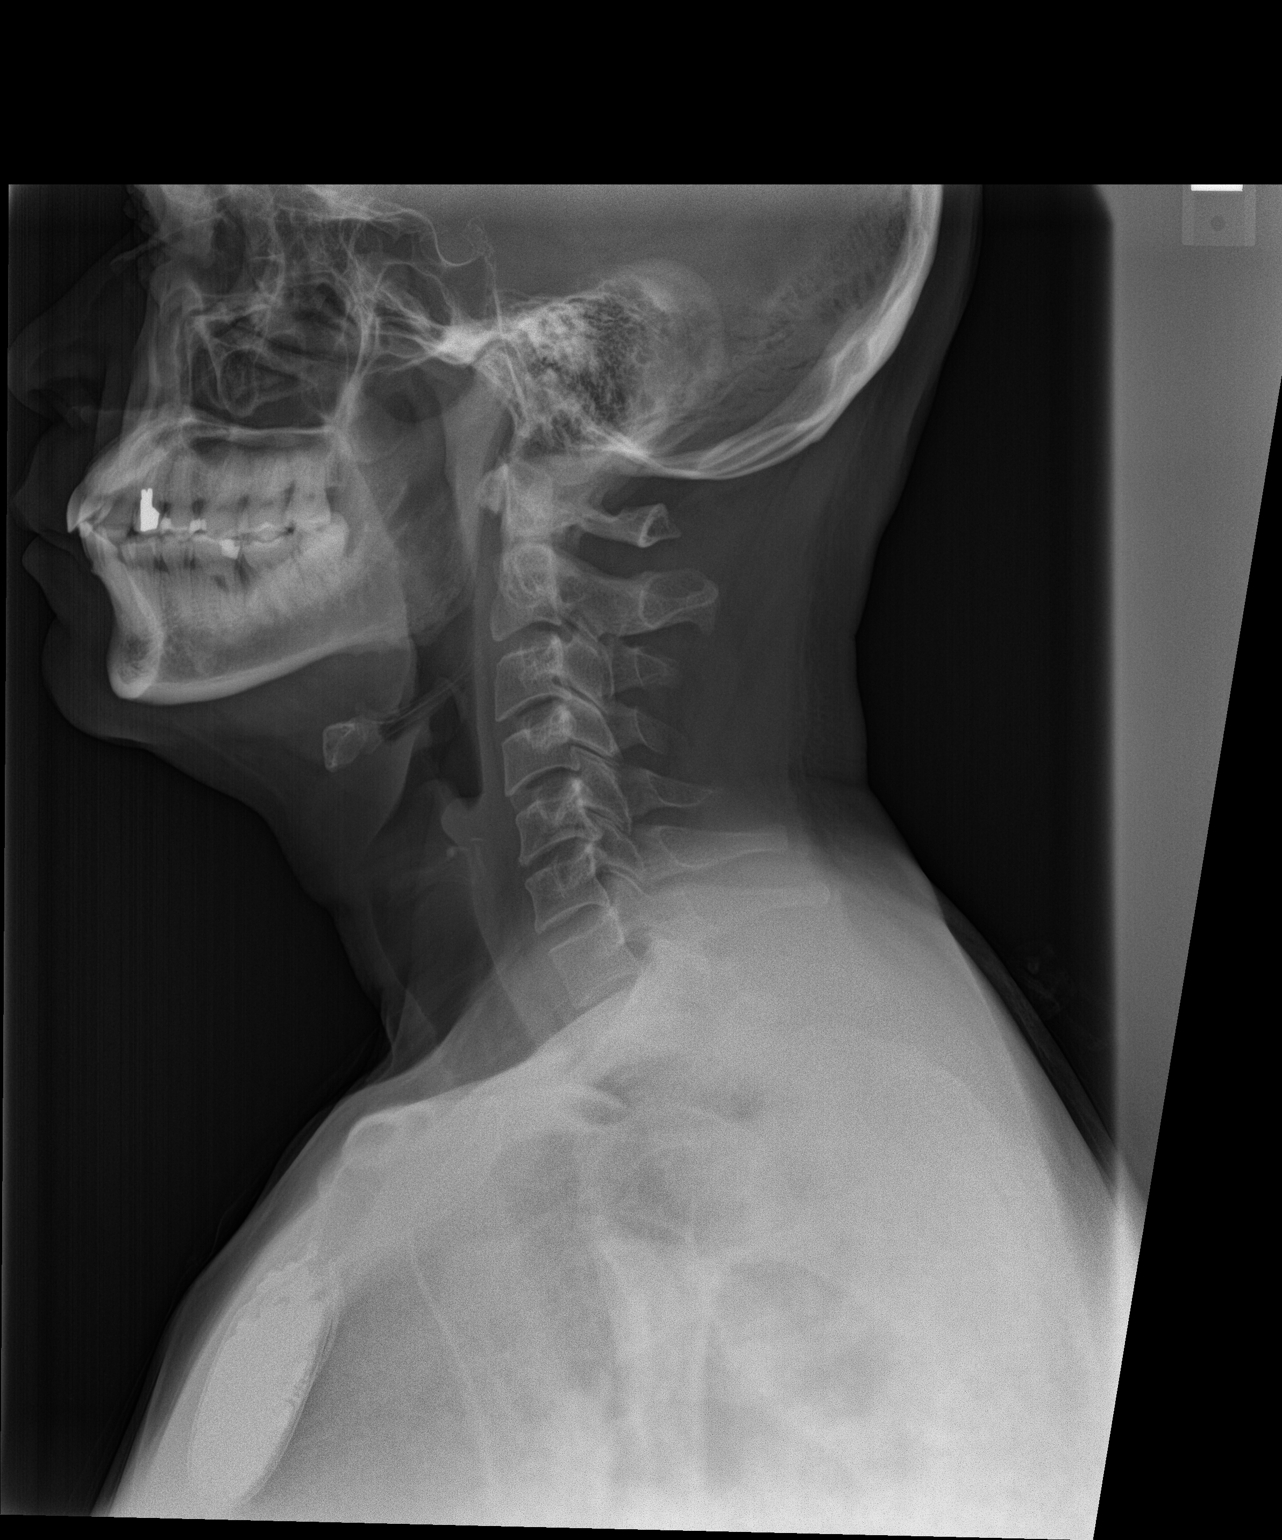

[neck ap]
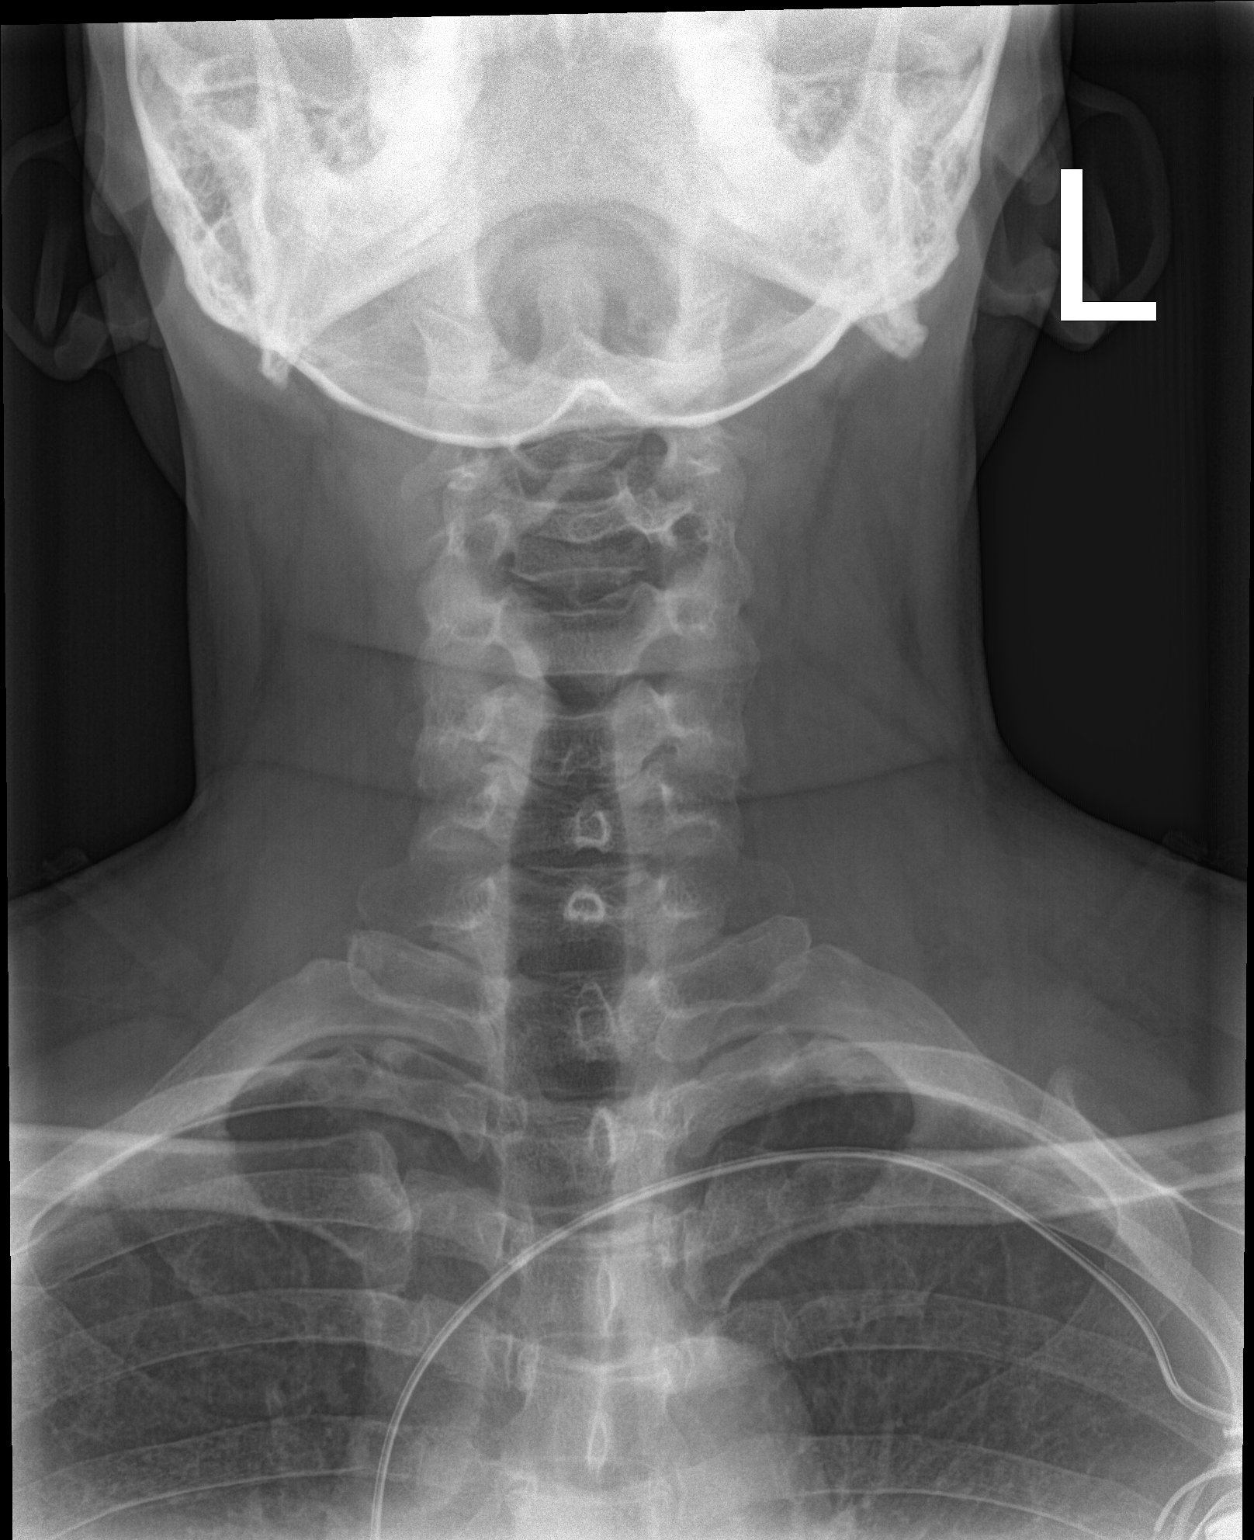

[2 of 2 positions shown; findings below may reference images not displayed]

FINDINGS: The cervical airway appears widely patent. The epiglottis is sharp.
The prevertebral soft tissue spaces are normal. The trachea is not
deviated. The cervical spine is normal where visualized. The
pulmonary apices are clear.
IMPRESSION: No evidence of compromise of the airway. The prevertebral soft
tissue spaces are normal. No acute bony abnormality of the cervical
spine where visualized.

## 2017-06-11 ENCOUNTER — Encounter: Payer: Self-pay | Admitting: Physician Assistant

## 2017-06-13 ENCOUNTER — Telehealth: Payer: Self-pay | Admitting: *Deleted

## 2017-06-13 NOTE — Telephone Encounter (Signed)
   [facility] Pre-operative Risk Assessment    Request for surgical clearance:  1. What type of surgery is being performed? colonoscopy  2. When is this surgery scheduled? 07/24/17   3. What type of clearance is required (medical clearance vs. Pharmacy clearance to hold med vs. Both)? Medical  4. Are there any medications that need to be held prior to surgery and how long? none  5. Practice name and name of physician performing surgery? [facility], Dr Cristina Gong   6. What is your office phone and fax number? Phone [phone] fax [phone]   7. Anesthesia type (None, local, MAC, general) ? Not noted   Pam, RN  06/13/2017, 3:53 PM  _________________________________________________________________   (provider comments below)

## 2017-06-15 NOTE — Telephone Encounter (Signed)
   Primary Cardiologist:Gregg Ladona Ridgel, MD  Chart reviewed as part of pre-operative protocol coverage. Because of Aleksei Stirling's past medical history and time since last visit, he/she will require a follow-up visit in order to better assess preoperative cardiovascular risk.  Pre-op covering staff: - Please schedule appointment and call patient to inform them. - Please contact requesting surgeon's office via preferred method (i.e, phone, fax) to inform them of need for appointment prior to surgery.  Theodore Demark, PA-C  06/15/2017, 4:39 PM

## 2017-06-18 NOTE — Telephone Encounter (Signed)
Pt notified scheduling will be calling for appt Faxed to requesting provider via EPIC

## 2017-06-20 NOTE — Telephone Encounter (Signed)
Pt see's Dr. Ladona Ridgel. I will reach out to Dr. Lubertha Basque scheduler Glynda Jaeger to help schedule appt as to schedule is full.

## 2017-06-20 NOTE — Telephone Encounter (Signed)
I s/w Glynda Jaeger, EP Scheduler who states to me she checked with the doctor and for a colonoscopy pt will be appt to see a Gen App. I tried to reach pt as well, though lm. Melissa lmptcb yesterday to call and make an appt for surgery clearance with any of Gen App's.

## 2017-06-21 NOTE — Telephone Encounter (Signed)
Left message with Phi, DPR on file, for her to have pt call us back to schedule an appt to see a APP for cardiac clearance.

## 2017-06-22 NOTE — Telephone Encounter (Signed)
appt has been made for [facility], PA-C for 07/17/17.

## 2017-07-03 ENCOUNTER — Encounter: Payer: Self-pay | Admitting: Physician Assistant

## 2017-07-03 ENCOUNTER — Telehealth: Payer: Self-pay

## 2017-07-03 NOTE — Telephone Encounter (Signed)
-----   Message from Marcelle Smiling Mesiemore sent at 07/02/2017 11:08 AM EDT ----- Contact: [phone] Nicola Police,  Patient wife called asking for letter.    She stated it needs to be on Letterhead and say something to the likings of:    Dr.Taylor is patients Physician and he is the one who placed patients defibrillator 1st time in December 2007 & 2nd time 2018.   Thanks, Selena Batten

## 2017-07-03 NOTE — Telephone Encounter (Signed)
Spoke with pt wife.  Notified letter available for pick up at front desk.

## 2017-07-17 ENCOUNTER — Encounter: Payer: Self-pay | Admitting: Physician Assistant

## 2017-07-17 ENCOUNTER — Ambulatory Visit: Payer: BLUE CROSS/BLUE SHIELD | Admitting: Physician Assistant

## 2017-07-17 ENCOUNTER — Ambulatory Visit (INDEPENDENT_AMBULATORY_CARE_PROVIDER_SITE_OTHER): Payer: BLUE CROSS/BLUE SHIELD | Admitting: *Deleted

## 2017-07-17 VITALS — BP 100/70 | HR 62 | Ht 67.0 in | Wt 170.0 lb

## 2017-07-17 DIAGNOSIS — Z01818 Encounter for other preprocedural examination: Secondary | ICD-10-CM

## 2017-07-17 DIAGNOSIS — Z95810 Presence of automatic (implantable) cardiac defibrillator: Secondary | ICD-10-CM

## 2017-07-17 DIAGNOSIS — I498 Other specified cardiac arrhythmias: Secondary | ICD-10-CM

## 2017-07-17 LAB — CUP PACEART INCLINIC DEVICE CHECK
Brady Statistic RV Percent Paced: 0 %
Date Time Interrogation Session: 20190507114935
HIGH POWER IMPEDANCE MEASURED VALUE: 78.75 Ohm
Implantable Lead Implant Date: 20180102
Implantable Lead Model: 7122
Lead Channel Pacing Threshold Amplitude: 1 V
Lead Channel Pacing Threshold Amplitude: 1 V
Lead Channel Pacing Threshold Pulse Width: 0.8 ms
Lead Channel Sensing Intrinsic Amplitude: 10.1 mV
Lead Channel Setting Pacing Pulse Width: 0.8 ms
MDC IDC LEAD LOCATION: 753860
MDC IDC MSMT BATTERY REMAINING LONGEVITY: 91 mo
MDC IDC MSMT LEADCHNL RV IMPEDANCE VALUE: 475 Ohm
MDC IDC MSMT LEADCHNL RV PACING THRESHOLD PULSEWIDTH: 0.8 ms
MDC IDC PG IMPLANT DT: 20180102
MDC IDC SET LEADCHNL RV PACING AMPLITUDE: 2.5 V
MDC IDC SET LEADCHNL RV SENSING SENSITIVITY: 0.5 mV
Pulse Gen Serial Number: 1246759

## 2017-07-17 NOTE — Progress Notes (Signed)
Cardiology Office Note    Date:  07/17/2017   ID:  Chad [name], DOB 02/15/1971, MRN [id number]  PCP:  No primary care provider on file.  Cardiologist: Dr. Ladona Ridgel  Chief Complaint: Surgical clearance for colonoscopy  History of Present Illness:   Chad [name] is a 47 y.o. male syncope and EKG consistent with Brugada syndrome s/p ICD, HLD and DVT presents for cardiac clearance.   He underwent insertion of a prophylactic defibrillator in 2007 who underwent extraction of a broken lead with re-insertion of a new device 03/2016. Since then, he has done well although he developed a left upper extremity DVT and was placed on anti-coagulation.  He was doing well on cardiac stand point when last seen by Dr. Ladona Ridgel 07/18/2016. Last device check 10/2016.  Patient has intermittent blood in his stool for quite some time.  He is scheduled to have colonoscopy for this.  He is not taking any medication currently.  Previously on atorvastatin.  He goes to gym 4 times per week.  He does lifting and running for about 1-1/2-hour time without any limitation.  Denies chest pain, shortness of breath, palpitations, orthopnea, PND, syncope, lower extremity edema or dizziness.  Past Medical History:  Diagnosis Date  . AICD (automatic cardioverter/defibrillator) present   . Brugada syndrome    s/p ICD 12/07  . DVT (deep venous thrombosis) (HCC)   . Dysrhythmia   . Hyperlipidemia   . Presence of permanent cardiac pacemaker   . Syncope    s/p ICD 12/07    Past Surgical History:  Procedure Laterality Date  . BACK SURGERY    . CARDIAC DEFIBRILLATOR PLACEMENT  02/2006   St. Jude Atlas  . FINGER TENDON REPAIR Left    2nd digit  . ICD GENERATOR CHANGE  03/14/2016   ICD GENERATOR REMOVAL AND INSERTION OF NEW ICD, WITH LEAD  REMOVAL EXTRACTION Hattie Perch 03/14/2016  . ICD LEAD REMOVAL N/A 03/14/2016   Procedure: ICD GENERATOR REMOVAL AND INSERTION OF NEW ICD, WITH LEAD  REMOVAL EXTRACTION;  Surgeon: Marinus Maw, MD;   Location: [facility] OR;  Service: Cardiovascular;  Laterality: N/A;  . LUMBAR DISC SURGERY  1997  . TEE WITHOUT CARDIOVERSION N/A 03/14/2016   Procedure: TRANSESOPHAGEAL ECHOCARDIOGRAM (TEE);  Surgeon: Marinus Maw, MD;  Location: [facility] OR;  Service: Cardiovascular;  Laterality: N/A;  . UPPER EXTREMITY VENOGRAPHY Bilateral 05/12/2016   Procedure: Central Venography;  Surgeon: Sherren Kerns, MD;  Location: [facility] INVASIVE CV LAB;  Service: Cardiovascular;  Laterality: Bilateral;    Current Medications: Prior to Admission medications   Medication Sig Start Date End Date Taking? Authorizing Provider  atorvastatin (LIPITOR) 20 MG tablet Take 1 tablet (20 mg total) by mouth daily at 6 PM. 03/21/16   McVey, Madelaine Bhat, PA-C  omeprazole (PRILOSEC) 20 MG capsule Take 1 capsule (20 mg total) by mouth daily. 07/18/16   Marinus Maw, MD    Allergies:   Patient has no known allergies.   Social History   Socioeconomic History  . Marital status: Divorced    Spouse name: Not on file  . Number of children: 2  . Years of education: GED  . Highest education level: Not on file  Occupational History    Employer: [employer]    Comment: [employer]  Social Needs  . Financial resource strain: Not on file  . Food insecurity:    Worry: Not on file    Inability: Not on file  . Transportation needs:  Medical: Not on file    Non-medical: Not on file  Tobacco Use  . Smoking status: Former Smoker    Packs/day: 1.00    Years: 23.00    Pack years: 23.00    Types: Cigarettes    Last attempt to quit: 03/14/2011    Years since quitting: 6.3  . Smokeless tobacco: Never Used  Substance and Sexual Activity  . Alcohol use: Yes    Alcohol/week: 3.6 oz    Types: 6 Cans of beer per week  . Drug use: No  . Sexual activity: Never  Lifestyle  . Physical activity:    Days per week: Not on file    Minutes per session: Not on file  . Stress: Not on file  Relationships  . Social connections:    Talks on phone: Not  on file    Gets together: Not on file    Attends religious service: Not on file    Active member of club or organization: Not on file    Attends meetings of clubs or organizations: Not on file    Relationship status: Not on file  Other Topics Concern  . Not on file  Social History Narrative   Married     Family History:  The patient's family history includes Diabetes in his mother; Heart disease in his mother; Hyperlipidemia in his mother.   ROS:   Please see the history of present illness.    ROS All other systems reviewed and are negative.   PHYSICAL EXAM:   VS:  BP 100/70   Pulse 62   Ht 5\' 7"  (1.702 m)   Wt 170 lb (77.1 kg)   SpO2 98%   BMI 26.63 kg/m    GEN: Well nourished, well developed, in no acute distress  HEENT: normal  Neck: no JVD, carotid bruits, or masses Cardiac: RRR; no murmurs, rubs, or gallops,no edema  Respiratory:  clear to auscultation bilaterally, normal work of breathing GI: soft, nontender, nondistended, + BS MS: no deformity or atrophy  Skin: warm and dry, no rash Neuro:  Alert and Oriented x 3, Strength and sensation are intact Psych: euthymic mood, full affect  Wt Readings from Last 3 Encounters:  07/17/17 170 lb (77.1 kg)  12/20/16 166 lb 12.8 oz (75.7 kg)  07/18/16 173 lb 3.2 oz (78.6 kg)      Studies/Labs Reviewed:   EKG:  EKG is not ordered today.    Recent Labs: 12/20/2016: ALT 54; BUN 16; Creatinine, Ser 0.82; Hemoglobin 15.3; Platelets 327; Potassium 4.8; Sodium 141; TSH 0.826   Lipid Panel    Component Value Date/Time   CHOL 174 01/11/2016 1150   TRIG 222 (H) 01/11/2016 1150   HDL 47 01/11/2016 1150   CHOLHDL 3.7 01/11/2016 1150   VLDL 44 (H) 01/11/2016 1150   LDLCALC 83 01/11/2016 1150   LDLDIRECT 93 12/23/2012 1108    Additional studies/ records that were reviewed today include:   TEE 03/14/2016 Result status: Preliminary result   Left ventricle: Normal cavity size and wall thickness. LV systolic function is  mildly reduced with an EF of 45-50%. Wall motion is abnormal. Diffuse mild LV hypokinesis without regional wall motion abnormalities. No thrombus present. No mass present.  Septum: No Patent Foramen Ovale present.  Left atrium: Patent foramen ovale not present.  Left atrium: No spontaneous echo contrast.  Aortic valve: No stenosis. Trace regurgitation. No AV vegetation.  Mitral valve: No leaflet thickening and calcification present. Mild to moderate regurgitation.  Right  ventricle: Normal cavity size and wall thickness. Mildly reduced systolic function. ICD present in the ventricle.  Tricuspid valve: Mild to moderate regurgitation. The tricuspid valve regurgitation jet is central.      ASSESSMENT & PLAN:    1. Brugada pattern on ECG and syncope s/p insertion on new ICD 03/2016 -Followed by Dr.  Ladona Ridgel.  Check device today.  2.  Surgical clearance for colonoscopy -He exercise 4 times/week without any angina or dyspnea.. Given past medical history and time since last visit, based on ACC/AHA guidelines, Chad [name] would be at acceptable risk for the planned procedure without further cardiovascular testing.   I will route this recommendation to the requesting party via Epic fax function and remove from pre-op pool. Please call with questions.  3. HLD -No longer on medication.  Works on diet and exercise.    Medication Adjustments/Labs and Tests Ordered: Current medicines are reviewed at length with the patient today.  Concerns regarding medicines are outlined above.  Medication changes, Labs and Tests ordered today are listed in the Patient Instructions below. Patient Instructions  Medication Instructions:  Your physician recommends that you continue on your current medications as directed. Please refer to the Current Medication list given to you today.   Labwork: None Ordered   Testing/Procedures: None Ordered   Follow-Up: Your physician wants you to follow-up in: 1 year  with Dr. Ladona Ridgel. You will receive a reminder letter in the mail two months in advance. If you don't receive a letter, please call our office to schedule the follow-up appointment.   If you need a refill on your cardiac medications before your next appointment, please call your pharmacy.   Thank you for choosing [facility]! Chad Bridegroom, RN [phone]       Signed, Chad [name], [place]  07/17/2017 9:08 AM    [facility] [address] [place], [place], [place]  [address] Phone: [phone]; Fax: [phone]

## 2017-07-17 NOTE — Progress Notes (Signed)
ICD check in clinic. Normal device function. Increased RV threshold noted - now 1.5V@0 .77ms, 1.0V@0 .80ms - pulse width increased to 0.41ms. Sensing consistent with previous device measurements. Impedance trends stable over time. No evidence of any ventricular arrhythmias. Histogram distribution appropriate for patient and level of activity. Stable thoracic impedance. Device programmed at appropriate safety margins. Device programmed to optimize intrinsic conduction. Estimated longevity 7.5 years. Pt will follow up with [facility] in 3 months. Patient education completed including shock plan. Vibration demonstrated for patient.

## 2017-07-17 NOTE — Patient Instructions (Addendum)
Medication Instructions:  Your physician recommends that you continue on your current medications as directed. Please refer to the Current Medication list given to you today.   Labwork: None Ordered   Testing/Procedures: None Ordered   Follow-Up: Your physician recommends that you schedule a follow-up appointment in: 3 months with Dr. Taylor   If you need a refill on your cardiac medications before your next appointment, please call your pharmacy.   Thank you for choosing [facility]! Alyne Martinson, RN [phone]    

## 2017-10-24 ENCOUNTER — Encounter: Payer: BLUE CROSS/BLUE SHIELD | Admitting: Internal Medicine

## 2017-11-02 ENCOUNTER — Telehealth: Payer: Self-pay | Admitting: Internal Medicine

## 2017-11-02 ENCOUNTER — Encounter: Payer: Self-pay | Admitting: Internal Medicine

## 2017-11-02 ENCOUNTER — Ambulatory Visit: Payer: BLUE CROSS/BLUE SHIELD | Admitting: Internal Medicine

## 2017-11-02 VITALS — BP 114/82 | HR 75 | Ht 67.0 in | Wt 172.8 lb

## 2017-11-02 DIAGNOSIS — Z95810 Presence of automatic (implantable) cardiac defibrillator: Secondary | ICD-10-CM | POA: Diagnosis not present

## 2017-11-02 DIAGNOSIS — I498 Other specified cardiac arrhythmias: Secondary | ICD-10-CM

## 2017-11-02 NOTE — Progress Notes (Signed)
HPI Mr. Virginia returns today after a year from our EP clinic for ongoing evaluation of his ventricular arrhythmias s/p ICD insertion. He has a h/o a broken lead and had an extraction and insertion of a new device. He developed a swollen left arm after surgery and was diagnosed with an occluded vein. Over time his left arm swelling improved. Now he has episodic pain and swelling in his neck, shoulder and chest on the left. His arm has minimal swelling compared to his right arm. No ICD shock. No Known Allergies   No current outpatient medications on file.   No current facility-administered medications for this visit.      Past Medical History:  Diagnosis Date  . AICD (automatic cardioverter/defibrillator) present   . Brugada syndrome    s/p ICD 12/07  . DVT (deep venous thrombosis) (HCC)   . Dysrhythmia   . Hyperlipidemia   . Presence of permanent cardiac pacemaker   . Syncope    s/p ICD 12/07    ROS:   All systems reviewed and negative except as noted in the HPI.   Past Surgical History:  Procedure Laterality Date  . BACK SURGERY    . CARDIAC DEFIBRILLATOR PLACEMENT  02/2006   St. Jude Atlas  . FINGER TENDON REPAIR Left    2nd digit  . ICD GENERATOR CHANGE  03/14/2016   ICD GENERATOR REMOVAL AND INSERTION OF NEW ICD, WITH LEAD  REMOVAL EXTRACTION Hattie Perch 03/14/2016  . ICD LEAD REMOVAL N/A 03/14/2016   Procedure: ICD GENERATOR REMOVAL AND INSERTION OF NEW ICD, WITH LEAD  REMOVAL EXTRACTION;  Surgeon: Marinus Maw, MD;  Location: [facility] OR;  Service: Cardiovascular;  Laterality: N/A;  . LUMBAR DISC SURGERY  1997  . TEE WITHOUT CARDIOVERSION N/A 03/14/2016   Procedure: TRANSESOPHAGEAL ECHOCARDIOGRAM (TEE);  Surgeon: Marinus Maw, MD;  Location: [facility] OR;  Service: Cardiovascular;  Laterality: N/A;  . UPPER EXTREMITY VENOGRAPHY Bilateral 05/12/2016   Procedure: Central Venography;  Surgeon: Sherren Kerns, MD;  Location: [facility] INVASIVE CV LAB;  Service: Cardiovascular;  Laterality:  Bilateral;     Family History  Problem Relation Age of Onset  . Hyperlipidemia Mother   . Heart disease Mother   . Diabetes Mother      Social History   Socioeconomic History  . Marital status: Divorced    Spouse name: Not on file  . Number of children: 2  . Years of education: GED  . Highest education level: Not on file  Occupational History    Employer: [employer]    Comment: [employer]  Social Needs  . Financial resource strain: Not on file  . Food insecurity:    Worry: Not on file    Inability: Not on file  . Transportation needs:    Medical: Not on file    Non-medical: Not on file  Tobacco Use  . Smoking status: Former Smoker    Packs/day: 1.00    Years: 23.00    Pack years: 23.00    Types: Cigarettes    Last attempt to quit: 03/14/2011    Years since quitting: 6.6  . Smokeless tobacco: Never Used  Substance and Sexual Activity  . Alcohol use: Yes    Alcohol/week: 6.0 standard drinks    Types: 6 Cans of beer per week  . Drug use: No  . Sexual activity: Never  Lifestyle  . Physical activity:    Days per week: Not on file    Minutes  per session: Not on file  . Stress: Not on file  Relationships  . Social connections:    Talks on phone: Not on file    Gets together: Not on file    Attends religious service: Not on file    Active member of club or organization: Not on file    Attends meetings of clubs or organizations: Not on file    Relationship status: Not on file  . Intimate partner violence:    Fear of current or ex partner: Not on file    Emotionally abused: Not on file    Physically abused: Not on file    Forced sexual activity: Not on file  Other Topics Concern  . Not on file  Social History Narrative   Married     BP 114/82   Pulse 75   Ht 5\' 7"  (1.702 m)   Wt 172 lb 12.8 oz (78.4 kg)   SpO2 97%   BMI 27.06 kg/m   Physical Exam:  Well appearing NAD HEENT: Unremarkable Neck:  No JVD, no thyromegally Lymphatics:  No  adenopathy Back:  No CVA tenderness Lungs:  Clear with no wheezes HEART:  Regular rate rhythm, no murmurs, no rubs, no clicks Abd:  soft, positive bowel sounds, no organomegally, no rebound, no guarding Ext:  2 plus pulses, no edema, no cyanosis, no clubbing, left arm is larger than the right arm.  Skin:  No rashes no nodules Neuro:  CN II through XII intact, motor grossly intact  DEVICE  Normal device function.  See PaceArt for details.   Assess/Plan: 1. Probable Brugada syndrome - he has not had any additional ICD therapies.  2. Possible SVC syncrome - his exam today is unremarkable except for swelling of his left arm which is mild. I have recommended he undergo a CT scan of the neck and chest to rule out an SVT obstruction. 3. ICD - his St. Jude device is working normally.  4. VT - he has a h/o NSVT. None on his monitor in the interim.   [name].D.

## 2017-11-02 NOTE — Patient Instructions (Addendum)
Medication Instructions:  Your physician recommends that you continue on your current medications as directed. Please refer to the Current Medication list given to you today.  Labwork: None ordered.  Testing/Procedures: Non-Cardiac CT scanning, (CAT scanning), is a noninvasive, special x-ray that produces cross-sectional images of the body using x-rays and a computer. CT scans help physicians diagnose and treat medical conditions. For some CT exams, a contrast material is used to enhance visibility in the area of the body being studied. CT scans provide greater clarity and reveal more details than regular x-ray exams.  Please schedule for a CT scan of left shoulder/neck/chest.   You will get a call from our CT department to schedule this test.  Follow-Up: Your physician wants you to follow-up in: one year with Dr. Ladona Ridgel.   You will receive a reminder letter in the mail two months in advance. If you don't receive a letter, please call our office to schedule the follow-up appointment.  Remote monitoring is used to monitor your ICD from home. This monitoring reduces the number of office visits required to check your device to one time per year. It allows [place] to keep an eye on the functioning of your device to ensure it is working properly. You are scheduled for a device check from home on 01/14/2018. You may send your transmission at any time that day. If you have a wireless device, the transmission will be sent automatically. After your physician reviews your transmission, you will receive a postcard with your next transmission date.  Any Other Special Instructions Will Be Listed Below (If Applicable).  If you need a refill on your cardiac medications before your next appointment, please call your pharmacy.

## 2017-11-02 NOTE — Telephone Encounter (Signed)
Pt coming in this afternoon to see Dr Ladona Ridgel  At 3:00 pm pt aware .Chad [name]

## 2017-11-02 NOTE — Telephone Encounter (Signed)
Spoke with pt and has noted chest shoulder and jaw pain off and on since last Saturday per pt feels wire maybe out of place from PM Instructed pt to go to ED for eval and treatment .Will forward to Dr Ladona Ridgel for review .Zack Seal

## 2017-11-02 NOTE — Telephone Encounter (Signed)
pain in the left side neck and left shoulder for this started Sat 17th  Scale 8  - 18th a scale of 10 pt called and would like an appointment sooner.  and now 6

## 2017-11-05 ENCOUNTER — Other Ambulatory Visit: Payer: Self-pay

## 2017-11-05 DIAGNOSIS — I871 Compression of vein: Secondary | ICD-10-CM

## 2017-11-05 NOTE — Progress Notes (Signed)
Order placed

## 2017-11-07 LAB — CUP PACEART INCLINIC DEVICE CHECK
Battery Remaining Longevity: 87 mo
Brady Statistic RV Percent Paced: 0 %
HighPow Impedance: 83.25 Ohm
Implantable Lead Implant Date: 20180102
Lead Channel Pacing Threshold Amplitude: 1 V
Lead Channel Setting Pacing Pulse Width: 0.8 ms
Lead Channel Setting Sensing Sensitivity: 0.5 mV
MDC IDC LEAD LOCATION: 753860
MDC IDC MSMT LEADCHNL RV IMPEDANCE VALUE: 512.5 Ohm
MDC IDC MSMT LEADCHNL RV PACING THRESHOLD PULSEWIDTH: 0.8 ms
MDC IDC MSMT LEADCHNL RV SENSING INTR AMPL: 11.3 mV
MDC IDC PG IMPLANT DT: 20180102
MDC IDC PG SERIAL: 1246759
MDC IDC SESS DTM: 20190823191042
MDC IDC SET LEADCHNL RV PACING AMPLITUDE: 2.5 V

## 2017-11-14 ENCOUNTER — Encounter: Payer: BLUE CROSS/BLUE SHIELD | Admitting: Physician Assistant

## 2017-11-14 ENCOUNTER — Ambulatory Visit (INDEPENDENT_AMBULATORY_CARE_PROVIDER_SITE_OTHER)
Admission: RE | Admit: 2017-11-14 | Discharge: 2017-11-14 | Disposition: A | Discharge status: Home / Self Care | Payer: BLUE CROSS/BLUE SHIELD | Source: Ambulatory Visit | Attending: Internal Medicine | Admitting: Internal Medicine

## 2017-11-14 DIAGNOSIS — I871 Compression of vein: Secondary | ICD-10-CM | POA: Diagnosis not present

## 2017-11-14 MED ORDER — IOPAMIDOL (ISOVUE-370) INJECTION 76%
100.0000 mL | Freq: Once | INTRAVENOUS | Status: AC | PRN
  Administered 2017-11-14: 100 mL via INTRAVENOUS

## 2017-12-02 ENCOUNTER — Encounter: Payer: Self-pay | Admitting: Internal Medicine

## 2017-12-03 ENCOUNTER — Encounter (HOSPITAL_COMMUNITY): Payer: Self-pay | Admitting: Emergency Medicine

## 2017-12-03 ENCOUNTER — Ambulatory Visit (HOSPITAL_COMMUNITY)
Admission: EM | Admit: 2017-12-03 | Discharge: 2017-12-03 | Disposition: A | Discharge status: Home / Self Care | Payer: BLUE CROSS/BLUE SHIELD | Attending: Family Medicine | Admitting: Family Medicine

## 2017-12-03 DIAGNOSIS — R42 Dizziness and giddiness: Secondary | ICD-10-CM | POA: Diagnosis not present

## 2017-12-03 NOTE — ED Provider Notes (Signed)
[facility]    CSN: [id number] Arrival date & time: 12/03/17  1249     History   Chief Complaint Chief Complaint  Patient presents with  . Dizziness    HPI Chad [name] is a 47 y.o. male.   Pt sts dizziness x 3 weeks; pt sts seen yesterday for same and took meclizine without relief.  Patient has been experiencing increasing dizziness for the last 3 weeks.  He was seen by his primary care doctors at [facility] yesterday.  He said they drew 5 tubes of blood and gave him meclizine.  They called him this morning and said the only abnormality was an elevated cholesterol.  Patient notes that the dizziness tends to go away when he is lying down and gets worse when he is moving or standing up.  Patient said no nausea, vomiting, diarrhea, chest pain.  He has occasional palpitations which have not changed.  He has a Brugada syndrome with an implanted pacemaker/defibrillator.  Patient has been to [facility] where he said 5 vials of blood were drawn yesterday.  He was called this morning and told that his total cholesterol was 250 and his LDL was 172.  Patient had his pacemaker and defibrillator interrogated 2 weeks ago after he suffered a fall.  He was told that his pacemaker and defibrillator were operating normally.     Past Medical History:  Diagnosis Date  . AICD (automatic cardioverter/defibrillator) present   . Brugada syndrome    s/p ICD 12/07  . DVT (deep venous thrombosis) (HCC)   . Dysrhythmia   . Hyperlipidemia   . Presence of permanent cardiac pacemaker   . Syncope    s/p ICD 12/07    Patient Active Problem List   Diagnosis Date Noted  . Internal hemorrhoids 01/03/2017  . ICD (implantable cardioverter-defibrillator) in place 03/14/2016  . Seasonal allergies 11/25/2014  . Hyperlipidemia 06/17/2014  . DVT of axillary vein, chronic left (HCC) 04/15/2014  . Anxiety 04/15/2014  . Brugada syndrome 09/01/2012  . Automatic implantable  cardioverter-defibrillator in situ 07/27/2009  . OTHER SPECIFIED CONGENITAL ANOMALY HEART OTHER 02/01/2009    Past Surgical History:  Procedure Laterality Date  . BACK SURGERY    . CARDIAC DEFIBRILLATOR PLACEMENT  02/2006   St. Jude Atlas  . FINGER TENDON REPAIR Left    2nd digit  . ICD GENERATOR CHANGE  03/14/2016   ICD GENERATOR REMOVAL AND INSERTION OF NEW ICD, WITH LEAD  REMOVAL EXTRACTION Hattie Perch 03/14/2016  . ICD LEAD REMOVAL N/A 03/14/2016   Procedure: ICD GENERATOR REMOVAL AND INSERTION OF NEW ICD, WITH LEAD  REMOVAL EXTRACTION;  Surgeon: Marinus Maw, MD;  Location: [facility] OR;  Service: Cardiovascular;  Laterality: N/A;  . LUMBAR DISC SURGERY  1997  . TEE WITHOUT CARDIOVERSION N/A 03/14/2016   Procedure: TRANSESOPHAGEAL ECHOCARDIOGRAM (TEE);  Surgeon: Marinus Maw, MD;  Location: [facility] OR;  Service: Cardiovascular;  Laterality: N/A;  . UPPER EXTREMITY VENOGRAPHY Bilateral 05/12/2016   Procedure: Central Venography;  Surgeon: Sherren Kerns, MD;  Location: [facility] INVASIVE CV LAB;  Service: Cardiovascular;  Laterality: Bilateral;       Home Medications    Prior to Admission medications   Not on File    Family History Family History  Problem Relation Age of Onset  . Hyperlipidemia Mother   . Heart disease Mother   . Diabetes Mother     Social History Social History   Tobacco Use  . Smoking status: Former Smoker  Packs/day: 1.00    Years: 23.00    Pack years: 23.00    Types: Cigarettes    Last attempt to quit: 03/14/2011    Years since quitting: 6.7  . Smokeless tobacco: Never Used  Substance Use Topics  . Alcohol use: Yes    Alcohol/week: 6.0 standard drinks    Types: 6 Cans of beer per week  . Drug use: No     Allergies   Patient has no known allergies.   Review of Systems Review of Systems  Cardiovascular: Positive for palpitations.  Neurological: Positive for dizziness.  All other systems reviewed and are negative.    Physical Exam Triage Vital  Signs ED Triage Vitals [12/03/17 1340]  Enc Vitals Group     BP 124/90     Pulse Rate 80     Resp 18     Temp 97.6 F (36.4 C)     Temp Source Oral     SpO2 96 %     Weight      Height      Head Circumference      Peak Flow      Pain Score      Pain Loc      Pain Edu?      Excl. in GC?    Orthostatic VS for the past 24 hrs:  BP- Lying Pulse- Lying BP- Sitting Pulse- Sitting BP- Standing at 0 minutes Pulse- Standing at 0 minutes  12/03/17 1405 119/71 62 116/76 68 129/86 72    Updated Vital Signs BP 124/90 (BP Location: Left Arm)   Pulse 80   Temp 97.6 F (36.4 C) (Oral)   Resp 18   SpO2 96%    Physical Exam  Constitutional: He is oriented to person, place, and time. He appears well-developed and well-nourished.  HENT:  Right Ear: External ear normal.  Left Ear: External ear normal.  Mouth/Throat: Oropharynx is clear and moist.  Eyes: Pupils are equal, round, and reactive to light. Conjunctivae and EOM are normal.  Neck: Normal range of motion. Neck supple.  Cardiovascular: Normal rate, regular rhythm and normal heart sounds.  Pulmonary/Chest: Effort normal and breath sounds normal.  Abdominal: Soft. Bowel sounds are normal.  Musculoskeletal: Normal range of motion.  Neurological: He is alert and oriented to person, place, and time.  Skin: Skin is warm and dry.  Psychiatric: He has a normal mood and affect. His behavior is normal. Judgment and thought content normal.  Nursing note and vitals reviewed.    UC Treatments / Results  Labs (all labs ordered are listed, but only abnormal results are displayed) Labs Reviewed - No data to display After calling [facility] several times, we are able to get through briefly and they told us that the blood sugar yesterday was 99.  They had no other blood work results to share. EKG None  Radiology No results found.  Procedures Procedures (including critical care time)  Medications Ordered in  UC Medications - No data to display  Initial Impression / Assessment and Plan / UC Course  I have reviewed the triage vital signs and the nursing notes.  Pertinent labs & imaging results that were available during my care of the patient were reviewed by me and considered in my medical decision making (see chart for details).   Final Clinical Impressions(s) / UC Diagnoses   Final diagnoses:  Vertigo     Discharge Instructions     I cannot find the cause of  your dizziness at this visit.  I would recommend that you call or go to [facility] to get the results of the rest of your labs.  If they are all normal, then you probably need a CAT scan of your head.  You can asked them to set up a CAT scan or you can come to the emergency department where we can perform it.    ED Prescriptions    None     Controlled Substance Prescriptions [place] Controlled Substance Registry consulted? Not Applicable   Elvina Sidle, MD 12/03/17 1446

## 2017-12-03 NOTE — Discharge Instructions (Signed)
I cannot find the cause of your dizziness at this visit.  I would recommend that you call or go to [facility] to get the results of the rest of your labs.  If they are all normal, then you probably need a CAT scan of your head.  You can asked them to set up a CAT scan or you can come to the emergency department where we can perform it.

## 2017-12-03 NOTE — ED Triage Notes (Signed)
Pt sts dizziness x 3 weeks; pt sts seen yesterday for same and took meclizine without relief

## 2017-12-04 ENCOUNTER — Telehealth: Payer: Self-pay | Admitting: General Practice

## 2017-12-04 ENCOUNTER — Telehealth: Payer: Self-pay | Admitting: Internal Medicine

## 2017-12-04 NOTE — Telephone Encounter (Signed)
LVM to inform Dr. Jonny Ruiz will accept him as a new patient. When patient returns call please set up the appointment.   The office type with be a office visit and in the comment you will put NEW PATIENT/ Insurance info

## 2017-12-04 NOTE — Telephone Encounter (Signed)
Did not need this encounter °

## 2017-12-04 NOTE — Telephone Encounter (Signed)
Copied from CRM [id number]. Topic: General - Other >> Dec 04, 2017 11:32 AM Debroah Loop wrote: Reason for CRM: Patients wife Chad [name] calling to request for her husband to become a new patient of Dr. Jonny Ruiz because his sisters in law, Phung Cyndie Chime and [employer], as well as mother in law, Lockhart, see's Dr. Jonny Ruiz.

## 2017-12-04 NOTE — Telephone Encounter (Signed)
Ok with me 

## 2017-12-05 ENCOUNTER — Encounter: Payer: Self-pay | Admitting: Physician Assistant

## 2017-12-05 ENCOUNTER — Ambulatory Visit (INDEPENDENT_AMBULATORY_CARE_PROVIDER_SITE_OTHER): Payer: BLUE CROSS/BLUE SHIELD | Admitting: *Deleted

## 2017-12-05 ENCOUNTER — Ambulatory Visit: Payer: BLUE CROSS/BLUE SHIELD | Admitting: Physician Assistant

## 2017-12-05 VITALS — BP 114/82 | HR 70 | Ht 67.0 in | Wt 172.0 lb

## 2017-12-05 DIAGNOSIS — Z95810 Presence of automatic (implantable) cardiac defibrillator: Secondary | ICD-10-CM | POA: Diagnosis not present

## 2017-12-05 DIAGNOSIS — R42 Dizziness and giddiness: Secondary | ICD-10-CM | POA: Diagnosis not present

## 2017-12-05 DIAGNOSIS — E785 Hyperlipidemia, unspecified: Secondary | ICD-10-CM | POA: Diagnosis not present

## 2017-12-05 DIAGNOSIS — I498 Other specified cardiac arrhythmias: Secondary | ICD-10-CM

## 2017-12-05 DIAGNOSIS — I2510 Atherosclerotic heart disease of native coronary artery without angina pectoris: Secondary | ICD-10-CM

## 2017-12-05 LAB — CUP PACEART INCLINIC DEVICE CHECK
Battery Remaining Longevity: 86 mo
Brady Statistic RV Percent Paced: 0 %
Date Time Interrogation Session: 20190925150037
HIGH POWER IMPEDANCE MEASURED VALUE: 73.125
Implantable Lead Location: 753860
Lead Channel Sensing Intrinsic Amplitude: 9.4 mV
Lead Channel Setting Sensing Sensitivity: 0.5 mV
MDC IDC LEAD IMPLANT DT: 20180102
MDC IDC MSMT LEADCHNL RV IMPEDANCE VALUE: 462.5 Ohm
MDC IDC PG IMPLANT DT: 20180102
MDC IDC SET LEADCHNL RV PACING AMPLITUDE: 2.5 V
MDC IDC SET LEADCHNL RV PACING PULSEWIDTH: 0.8 ms
Pulse Gen Serial Number: 1246759

## 2017-12-05 MED ORDER — ASPIRIN EC 81 MG PO TBEC: 81.0000 mg | DELAYED_RELEASE_TABLET | Freq: Every day | ORAL | Status: DC

## 2017-12-05 NOTE — Patient Instructions (Signed)
Medication Instructions:  1. START ASPIRIN 81 MG DAILY  Labwork: NONE ORDERED TODAY  Testing/Procedures: 1. Your physician has requested that you have an echocardiogram. Echocardiography is a painless test that uses sound waves to create images of your heart. It provides your doctor with information about the size and shape of your heart and how well your heart's chambers and valves are working. This procedure takes approximately one hour. There are no restrictions for this procedure.  2. Your physician has requested that you have a carotid duplex. This test is an ultrasound of the carotid arteries in your neck. It looks at blood flow through these arteries that supply the brain with blood. Allow one hour for this exam. There are no restrictions or special instructions.    Follow-Up: DR. Ladona Ridgel IN 4 WEEKS   Any Other Special Instructions Will Be Listed Below (If Applicable).     If you need a refill on your cardiac medications before your next appointment, please call your pharmacy.

## 2017-12-05 NOTE — Progress Notes (Signed)
Cardiology Office Note:    Date:  12/05/2017   ID:  Chad [name], DOB January 01, 1971, MRN [id number]  PCP:  Patient, No Pcp Per  Cardiologist:  Lewayne Bunting, MD  Electrophysiologist:  None   Referring MD: No ref. provider found   Chief Complaint  Patient presents with  . Dizziness    History of Present Illness:    Chad [name] is a 47 y.o. male with NSVT, Brugada syndrome, s/p ICD, hyperlipidemia.  He underwent lead extraction and revision in 2018 for a fractured lead and developed an upper extremity DVT.  He was last seen by Dr. Ladona Ridgel in 10/2017.  A CTA was done which did not demonstrate an upper extremity DVT.  There was some narrowing of the L subclavian vein which may contribute to swelling. There were also scattered, small pulmonary nodules.    Chad [name] returns for evaluation of dizziness.  He is here alone.  Over the past 3 weeks, he has had intermittent dizziness described as lightheadedness.  He denies syncope. His dizziness has gotten worse.  He went to urgent care last week. His orthostatic vital signs were normal.  He brings in a list of blood work done by a walk-in clinic in [place].  Labs 12/02/2017: Total cholesterol 259, triglycerides 170, HDL 53, LDL 172, TSH 1.042, A1c 5.8, hemoglobin 14.6, potassium 4.1, creatinine 1.0, ALT 38.  His vitamin D level was low and his provider placed him on vitamin D replacement.  He feels that he does get lightheaded with exercise.  However, he denies exertional chest pain or shortness of breath.  He denies orthopnea, PND or lower extremity swelling.  The provider in [facility] gave him meclizine.  This has not provided any relief.  He notes dizziness at any time.  Positional changes do not seem to make a difference.  Prior CV studies:   The following studies were reviewed today:  Chest CTA 11/14/17 IMPRESSION: There is no evidence of DVT. A left subclavian AICD device is in place. There is some pre-existing narrowing of the left subclavian vein at the  thoracic outlet. This combined with the contained AICD lead may be causing the patient's left arm swelling.  Bilateral pulmonary nodules measuring 5 mm or less are noted above. No follow-up needed if patient is low-risk (and has no known or suspected primary neoplasm). Non-contrast chest CT can be considered in 12 months if patient is high-risk. This recommendation follows the consensus statement: Guidelines for Management of Incidental Pulmonary Nodules Detected on CT Images: From the Fleischner Society 2017; Radiology 2017; 284:228-243.  Aortic Atherosclerosis (ICD10-I70.0).  TEE 03/14/16 EF 45-50, no RWMA, no thrombus present, trace AI, mildly reduced RVSF, mild to mod TR  Echo 02/15/06 EF 65, no RWMA  Past Medical History:  Diagnosis Date  . AICD (automatic cardioverter/defibrillator) present   . Brugada syndrome    s/p ICD 12/07  . DVT (deep venous thrombosis) (HCC)   . Dysrhythmia   . Hyperlipidemia   . Presence of permanent cardiac pacemaker   . Syncope    s/p ICD 12/07   Surgical Hx: The patient  has a past surgical history that includes Cardiac defibrillator placement (02/2006); Icd generator change (03/14/2016); Back surgery; Lumbar disc surgery (1997); Finger tendon repair (Left); Icd lead removal (N/A, 03/14/2016); TEE without cardioversion (N/A, 03/14/2016); and UPPER EXTREMITY VENOGRAPHY (Bilateral, 05/12/2016).   Current Medications: Current Meds  Medication Sig  . atorvastatin (LIPITOR) 20 MG tablet Take 1 tablet by mouth daily.  Allergies:   Patient has no known allergies.   Social History   Tobacco Use  . Smoking status: Former Smoker    Packs/day: 1.00    Years: 23.00    Pack years: 23.00    Types: Cigarettes    Last attempt to quit: 03/14/2011    Years since quitting: 6.7  . Smokeless tobacco: Never Used  Substance Use Topics  . Alcohol use: Yes    Alcohol/week: 6.0 standard drinks    Types: 6 Cans of beer per week  . Drug use: No     Family  Hx: The patient's family history includes Diabetes in his mother; Heart disease in his mother; Hyperlipidemia in his mother.  ROS:   Please see the history of present illness.    Review of Systems  Musculoskeletal: Positive for back pain.  Neurological: Positive for dizziness.   All other systems reviewed and are negative.   EKGs/Labs/Other Test Reviewed:    EKG:  EKG is not ordered today.  EKG from 11/02/2017 demonstrated sinus rhythm, heart rate 75, RSR prime with downsloping ST elevation in V1-V2 consistent with Brugada syndrome, QTC 422  Device interrogation Patient's device was interrogated today.  It demonstrated no episodes of ventricular tachycardia.  His rate limit was lowered from 160-140 to hopefully capture rates that are slower.  His thoracic impedance was abnormal about 1 week ago but did return to normal.  Heart rate variability is 50-110.  He has had less than 1% ventricular pacing (heart rate 40).  Recent Labs: 12/20/2016: ALT 54; BUN 16; Creatinine, Ser 0.82; Hemoglobin 15.3; Platelets 327; Potassium 4.8; Sodium 141; TSH 0.826   Recent Lipid Panel Lab Results  Component Value Date/Time   CHOL 174 01/11/2016 11:50 AM   TRIG 222 (H) 01/11/2016 11:50 AM   HDL 47 01/11/2016 11:50 AM   CHOLHDL 3.7 01/11/2016 11:50 AM   LDLCALC 83 01/11/2016 11:50 AM   LDLDIRECT 93 12/23/2012 11:08 AM    Physical Exam:    VS:  BP 114/82   Pulse 70   Ht 5\' 7"  (1.702 m)   Wt 172 lb (78 kg)   SpO2 97%   BMI 26.94 kg/m     Wt Readings from Last 3 Encounters:  12/05/17 172 lb (78 kg)  11/02/17 172 lb 12.8 oz (78.4 kg)  07/17/17 170 lb (77.1 kg)     Physical Exam  Constitutional: He is oriented to person, place, and time. He appears well-developed and well-nourished. No distress.  HENT:  Head: Normocephalic and atraumatic.  Eyes: No scleral icterus.  Neck: No JVD present. No thyromegaly present.  Cardiovascular: Normal rate and regular rhythm.  No murmur  heard. Pulmonary/Chest: Effort normal. He has no rales.  Abdominal: Soft.  Musculoskeletal: He exhibits no edema.  Lymphadenopathy:    He has no cervical adenopathy.  Neurological: He is alert and oriented to person, place, and time.  Skin: Skin is warm and dry.  Psychiatric: He has a normal mood and affect.    ASSESSMENT & PLAN:    Dizziness Etiology of his dizziness is not entirely clear.  His symptoms are somewhat nonspecific.  He is able to tell me that his dizziness occurs at times with exercise.  He does not complain of chest pain or shortness of breath.  He does have a fairly high cholesterol.  He was not on medication until recently.  He also has coronary calcification as well as aortic atherosclerosis on his CT scan.  His symptoms would be  quite atypical for ischemia.  We did interrogate his device today.  His thoracic impedance was abnormal about a week ago.  He has never had clinical heart failure.  He does not display any signs or symptoms of volume excess today.  His thoracic impedance did return to normal.  He has an appointment to establish with primary care next week.  -Arrange echocardiogram to rule out structural heart disease  -If he has significant RV dysfunction/dilation/pulmonary hypertension, consider V/Q to rule out PE  -Arrange carotid US to rule out vertebrobasilar insufficiency  -Follow up with primary care to evaluate for non-cardiac causes of dizziness   Brugada syndrome No nonsustained ventricular tachycardia noted on device interrogation today.  ICD (implantable cardioverter-defibrillator) in place Follow-up with EP as planned.  Hyperlipidemia, unspecified hyperlipidemia type Managed by primary care.  Recent LDL significantly elevated.  He was just placed back on Lipitor.  He will need follow-up labs in the next couple of months.  Coronary artery calcification seen on CT scan  He has not had any exertional chest pain or shortness of breath.  He does feel  that his dizziness sometimes gets worse with exertion.  His symptoms of dizziness would be quite atypical for ischemia.  He does have calcification noted on his recent CT scan as well as aortic atherosclerosis.  -Continue statin  -Start aspirin 81 mg daily  -Arrange echo as noted  -If his EF is down, he will likely need cardiac catheterization  -If his EF is normal, I will arrange an exercise Myoview   Dispo:  Return in about 4 weeks (around 01/02/2018) for Close Follow Up w/ Dr. Ladona Ridgel, or Tereso Newcomer, PA-C.   Medication Adjustments/Labs and Tests Ordered: Current medicines are reviewed at length with the patient today.  Concerns regarding medicines are outlined above.  Tests Ordered: Orders Placed This Encounter  Procedures  . ECHOCARDIOGRAM COMPLETE   Medication Changes: Meds ordered this encounter  Medications  . aspirin EC 81 MG tablet    Sig: Take 1 tablet (81 mg total) by mouth daily.    Signed, Tereso Newcomer, PA-C  12/05/2017 4:48 PM    [facility] [address] [place], [place], [place]  [address] Phone: [phone]; Fax: [phone]

## 2017-12-05 NOTE — Progress Notes (Signed)
ICD check in clinic for [place], [place] due to dizziness x 3 weeks. No episodes. Corvue shows HF decompensation < 1 week, resolved. HRs 50-110 predominately. VT-1 monitor zone decreased from 181bpm to 160bpm. Follow up as scheduled.

## 2017-12-05 NOTE — Telephone Encounter (Signed)
Appointment has been made for 10/2

## 2017-12-12 ENCOUNTER — Other Ambulatory Visit: Payer: Self-pay

## 2017-12-12 ENCOUNTER — Encounter: Payer: Self-pay | Admitting: Internal Medicine

## 2017-12-12 ENCOUNTER — Ambulatory Visit (HOSPITAL_BASED_OUTPATIENT_CLINIC_OR_DEPARTMENT_OTHER): Payer: BLUE CROSS/BLUE SHIELD

## 2017-12-12 ENCOUNTER — Ambulatory Visit: Payer: BLUE CROSS/BLUE SHIELD | Admitting: Internal Medicine

## 2017-12-12 ENCOUNTER — Ambulatory Visit (HOSPITAL_COMMUNITY)
Admission: RE | Admit: 2017-12-12 | Discharge: 2017-12-12 | Disposition: A | Discharge status: Home / Self Care | Payer: BLUE CROSS/BLUE SHIELD | Source: Ambulatory Visit | Attending: Internal Medicine | Admitting: Internal Medicine

## 2017-12-12 VITALS — BP 106/74 | HR 72 | Temp 98.2°F | Ht 67.0 in | Wt 174.0 lb

## 2017-12-12 DIAGNOSIS — I2510 Atherosclerotic heart disease of native coronary artery without angina pectoris: Secondary | ICD-10-CM | POA: Insufficient documentation

## 2017-12-12 DIAGNOSIS — R42 Dizziness and giddiness: Secondary | ICD-10-CM

## 2017-12-12 DIAGNOSIS — Z23 Encounter for immunization: Secondary | ICD-10-CM | POA: Diagnosis not present

## 2017-12-12 DIAGNOSIS — Z9109 Other allergy status, other than to drugs and biological substances: Secondary | ICD-10-CM

## 2017-12-12 DIAGNOSIS — Z0001 Encounter for general adult medical examination with abnormal findings: Secondary | ICD-10-CM

## 2017-12-12 DIAGNOSIS — J302 Other seasonal allergic rhinitis: Secondary | ICD-10-CM | POA: Diagnosis not present

## 2017-12-12 DIAGNOSIS — E785 Hyperlipidemia, unspecified: Secondary | ICD-10-CM | POA: Diagnosis not present

## 2017-12-12 LAB — ECHOCARDIOGRAM COMPLETE
HEIGHTINCHES: 67 in
Weight: 2784 oz

## 2017-12-12 MED ORDER — TRIAMCINOLONE ACETONIDE 55 MCG/ACT NA AERO: 2.0000 | INHALATION_SPRAY | Freq: Every day | NASAL | 12 refills | Status: DC

## 2017-12-12 MED ORDER — CETIRIZINE HCL 10 MG PO TABS: 10.0000 mg | ORAL_TABLET | Freq: Every day | ORAL | 11 refills | Status: DC

## 2017-12-12 MED ORDER — DIAZEPAM 5 MG PO TABS: ORAL_TABLET | ORAL | 0 refills | Status: DC

## 2017-12-12 MED ORDER — METHYLPREDNISOLONE ACETATE 80 MG/ML IJ SUSP
80.0000 mg | Freq: Once | INTRAMUSCULAR | Status: AC
  Administered 2017-12-12: 80 mg via INTRAMUSCULAR

## 2017-12-12 MED ORDER — PREDNISONE 10 MG PO TABS: ORAL_TABLET | ORAL | 0 refills | Status: DC

## 2017-12-12 NOTE — Assessment & Plan Note (Addendum)
Suspect vertigo left middle ear related, meclizine not helping, for valium 5 qd x 3 days  In addition to the time spent performing CPE, I spent an additional 25 minutes face to face,in which greater than 50% of this time was spent in counseling and coordination of care for patient's acute illness as documented, including the differential dx, treatment, further evaluation and other management of dizziness, HLD, and allergies

## 2017-12-12 NOTE — Assessment & Plan Note (Signed)
For f/u lab at 4 wks,  to f/u any worsening symptoms or concerns

## 2017-12-12 NOTE — Assessment & Plan Note (Addendum)
Mild to mod, for zyrtec and nasacort asd, depomedrol IM 80, to f/u any worsening symptoms or concerns

## 2017-12-12 NOTE — Progress Notes (Signed)
Subjective:    Patient ID: Chad [name], male    DOB: 29-Aug-1970, 47 y.o.   MRN: [id number]  HPI  Here for wellness and establish with interpretor though he speaks English quite well;  Overall doing ok;  Pt denies Chest pain, worsening SOB, DOE, wheezing, orthopnea, PND, worsening LE edema, palpitations, or syncope.  Pt denies neurological change such as new headache, facial or extremity weakness.  Pt denies polydipsia, polyuria, or low sugar symptoms. Pt states overall good compliance with treatment and medications, good tolerability, and has been trying to follow appropriate diet.  Pt denies worsening depressive symptoms, suicidal ideation or panic. No fever, night sweats, wt loss, loss of appetite, or other constitutional symptoms.  Pt states good ability with ADL's, has low fall risk, home safety reviewed and adequate, no other significant changes in hearing or vision, and not active with exercise recently.  Admits to poor diet over the last 2 decades and really likes fried chicken, but none lately  Tolerating the 20 mg lipitor restarted x 2 wks ago.  Also: 47 y.o. male with NSVT, Brugada syndrome, s/p ICD, hyperlipidemia.  He underwent lead extraction and revision in 2018 for a fractured lead and developed an upper extremity DVT. Was seen per cardiology sept 25 with c/o dizziness, etiology unclear, echo ordered, v/q to be considered, carotid u/s ordered.  Has hx of high LDL, recently placed on lipitor.  Has known Cor Calcification on CT - echo to be followed and if EF down, will need cath, if EF is normal will need stress testing.  F/u planned for 4 wks.  On the lipitor 20 mg for last 2wk Also has what sounds like positional dizziness with activities such as standing, sitting up, turning or walking.  Has been seen per UC with meclizine that did not help.  Also has allergyy symptoms - Does have several wks ongoing nasal allergy symptoms with clearish congestion, itch and sneezing, without fever, pain, ST,  cough, swelling or wheezing.  Does not try any otc meds Past Medical History:  Diagnosis Date  . AICD (automatic cardioverter/defibrillator) present   . Brugada syndrome    s/p ICD 12/07  . DVT (deep venous thrombosis) (HCC)   . Dysrhythmia   . Hyperlipidemia   . Presence of permanent cardiac pacemaker   . Syncope    s/p ICD 12/07   Past Surgical History:  Procedure Laterality Date  . BACK SURGERY    . CARDIAC DEFIBRILLATOR PLACEMENT  02/2006   [employer]  . FINGER TENDON REPAIR Left    2nd digit  . ICD GENERATOR CHANGE  03/14/2016   ICD GENERATOR REMOVAL AND INSERTION OF NEW ICD, WITH LEAD  REMOVAL EXTRACTION Hattie Perch 03/14/2016  . ICD LEAD REMOVAL N/A 03/14/2016   Procedure: ICD GENERATOR REMOVAL AND INSERTION OF NEW ICD, WITH LEAD  REMOVAL EXTRACTION;  Surgeon: Marinus Maw, MD;  Location: [facility] OR;  Service: Cardiovascular;  Laterality: N/A;  . LUMBAR DISC SURGERY  1997  . TEE WITHOUT CARDIOVERSION N/A 03/14/2016   Procedure: TRANSESOPHAGEAL ECHOCARDIOGRAM (TEE);  Surgeon: Marinus Maw, MD;  Location: [facility] OR;  Service: Cardiovascular;  Laterality: N/A;  . UPPER EXTREMITY VENOGRAPHY Bilateral 05/12/2016   Procedure: Central Venography;  Surgeon: Sherren Kerns, MD;  Location: [facility] INVASIVE CV LAB;  Service: Cardiovascular;  Laterality: Bilateral;    reports that he quit smoking about 6 years ago. His smoking use included cigarettes. He has a 23.00 pack-year smoking history. He has never used  smokeless tobacco. He reports that he drinks about 6.0 standard drinks of alcohol per week. He reports that he does not use drugs. family history includes Diabetes in his mother; Heart disease in his mother; Hyperlipidemia in his mother. No Known Allergies Current Outpatient Medications on File Prior to Visit  Medication Sig Dispense Refill  . aspirin EC 81 MG tablet Take 1 tablet (81 mg total) by mouth daily.    Marland Kitchen atorvastatin (LIPITOR) 20 MG tablet Take 1 tablet by mouth daily.  0  .  ergocalciferol (VITAMIN D2) 50000 units capsule Take 50,000 Units by mouth once a week.     No current facility-administered medications on file prior to visit.    Review of Systems Constitutional: Negative for other unusual diaphoresis, sweats, appetite or weight changes HENT: Negative for other worsening hearing loss, ear pain, facial swelling, mouth sores or neck stiffness.   Eyes: Negative for other worsening pain, redness or other visual disturbance.  Respiratory: Negative for other stridor or swelling Cardiovascular: Negative for other palpitations or other chest pain  Gastrointestinal: Negative for worsening diarrhea or loose stools, blood in stool, distention or other pain Genitourinary: Negative for hematuria, flank pain or other change in urine volume.  Musculoskeletal: Negative for myalgias or other joint swelling.  Skin: Negative for other color change, or other wound or worsening drainage.  Neurological: Negative for other syncope or numbness. Hematological: Negative for other adenopathy or swelling Psychiatric/Behavioral: Negative for hallucinations, other worsening agitation, SI, self-injury, or new decreased concentration All other system neg per pt    Objective:   Physical Exam BP 106/74   Pulse 72   Temp 98.2 F (36.8 C) (Oral)   Ht 5\' 7"  (1.702 m)   Wt 174 lb (78.9 kg)   SpO2 96%   BMI 27.25 kg/m  VS noted,  Constitutional: Pt is oriented to person, place, and time. Appears well-developed and well-nourished, in no significant distress and comfortable Head: Normocephalic and atraumatic  Eyes: Conjunctivae and EOM are normal. Pupils are equal, round, and reactive to light Right Ear: External ear normal without discharge Left Ear: External ear normal without discharge  Left TM with mod erythema and small effusion Nose: Nose without discharge or deformity Mouth/Throat: Oropharynx is without other ulcerations and moist  Neck: Normal range of motion. Neck supple. No  JVD present. No tracheal deviation present or significant neck LA or mass Cardiovascular: Normal rate, regular rhythm, normal heart sounds and intact distal pulses.  Pulmonary/Chest: WOB normal and breath sounds without rales or wheezing  Abdominal: Soft. Bowel sounds are normal. NT. No HSM  Musculoskeletal: Normal range of motion. Exhibits no edema Lymphadenopathy: Has no other cervical adenopathy.  Neurological: Pt is alert and oriented to person, place, and time. Pt has normal reflexes. No cranial nerve deficit. Motor grossly intact, Gait intact Skin: Skin is warm and dry. No rash noted or new ulcerations Psychiatric:  Has normal mood and affect. Behavior is normal without agitation No other exam findings Lab Results  Component Value Date   WBC 10.8 12/20/2016   HGB 15.3 12/20/2016   HCT 45.6 12/20/2016   PLT 327 12/20/2016   GLUCOSE 94 12/20/2016   CHOL 174 01/11/2016   TRIG 222 (H) 01/11/2016   HDL 47 01/11/2016   LDLDIRECT 93 12/23/2012   LDLCALC 83 01/11/2016   ALT 54 (H) 12/20/2016   AST 32 12/20/2016   NA 141 12/20/2016   K 4.8 12/20/2016   CL 100 12/20/2016   CREATININE 0.82 12/20/2016  BUN 16 12/20/2016   CO2 26 12/20/2016   TSH 0.826 12/20/2016   HGBA1C 6.0 (H) 12/20/2016          Assessment & Plan:

## 2017-12-12 NOTE — Assessment & Plan Note (Signed)

## 2017-12-12 NOTE — Patient Instructions (Addendum)
You had the Tdap tetanus and flu shots today  You had the steroid shot today  Please take all new medication as prescribed - the valium 5 mg per day for 3 days  OK to stop the meclizine if not helping  Please take all new medication as prescribed - the prednisone, zyrtec, and nasacort for allergies  You can also take Delsym OTC for cough, and/or Mucinex (or it's generic off brand) for congestion, and tylenol as needed for pain.  Please call in 2-3 wks if not improved, for referral to Neurology  Please continue all other medications as before, and refills have been done if requested.  Please have the pharmacy call with any other refills you may need.  Please continue your efforts at being more active, low cholesterol diet, and weight control.  You are otherwise up to date with prevention measures today.  Please keep your appointments with your specialists as you may have planned - the echocardiogram, carotid testing, and cardiology   No further lab work is needed today  Please return in 2 weeks to the LAB only ( which would be 4 weeks after you started the 20 mg of lipitor) for repeat cholesterol testing  Please return in 3 months for office visit, or sooner if needed

## 2017-12-13 ENCOUNTER — Encounter: Payer: Self-pay | Admitting: Physician Assistant

## 2017-12-13 ENCOUNTER — Telehealth: Payer: Self-pay

## 2017-12-13 DIAGNOSIS — I2510 Atherosclerotic heart disease of native coronary artery without angina pectoris: Secondary | ICD-10-CM

## 2017-12-13 DIAGNOSIS — I2584 Coronary atherosclerosis due to calcified coronary lesion: Principal | ICD-10-CM

## 2017-12-13 NOTE — Telephone Encounter (Signed)
Pt aware of echo results and recommendations for exercise myoview. Pt understands he will be contacted by scheduling to have this set up. He understands he will have instructions sent to him through the mail as well. Pt will call with any instructional questions as well.

## 2017-12-13 NOTE — Telephone Encounter (Signed)
-----   Message from Beatrice Lecher, [place] sent at 12/13/2017  9:37 AM EDT ----- This study demonstrates:  Normal heart function, no significant valve disease.  Medication changes / Follow up studies / Other recommendations:    - Continue current medications and follow up as planned.   - Please arrange Exercise Myoview (Dx: Coronary calcification) Please send results to the PCP:  Corwin Levins, MD  Tereso Newcomer, PA-C 12/13/2017 9:35 AM

## 2017-12-26 ENCOUNTER — Encounter: Payer: Self-pay | Admitting: Internal Medicine

## 2017-12-26 ENCOUNTER — Other Ambulatory Visit (INDEPENDENT_AMBULATORY_CARE_PROVIDER_SITE_OTHER): Payer: BLUE CROSS/BLUE SHIELD

## 2017-12-26 DIAGNOSIS — E785 Hyperlipidemia, unspecified: Secondary | ICD-10-CM

## 2017-12-26 LAB — LIPID PANEL
CHOL/HDL RATIO: 4
Cholesterol: 163 mg/dL (ref 0–200)
HDL: 42.3 mg/dL (ref >39.00)
NonHDL: 120.91
Triglycerides: 292 mg/dL — ABNORMAL HIGH (ref 0.0–149.0)
VLDL: 58.4 mg/dL — ABNORMAL HIGH (ref 0.0–40.0)

## 2017-12-26 LAB — HEPATIC FUNCTION PANEL
ALT: 87 U/L — ABNORMAL HIGH (ref 0–53)
AST: 44 U/L — ABNORMAL HIGH (ref 0–37)
Albumin: 4.4 g/dL (ref 3.5–5.2)
Alkaline Phosphatase: 59 U/L (ref 39–117)
BILIRUBIN DIRECT: 0.1 mg/dL (ref 0.0–0.3)
BILIRUBIN TOTAL: 0.7 mg/dL (ref 0.2–1.2)
Total Protein: 7.1 g/dL (ref 6.0–8.3)

## 2017-12-26 LAB — LDL CHOLESTEROL, DIRECT: Direct LDL: 75 mg/dL

## 2017-12-31 ENCOUNTER — Telehealth (HOSPITAL_COMMUNITY): Payer: Self-pay | Admitting: *Deleted

## 2017-12-31 NOTE — Telephone Encounter (Signed)
Patient given detailed instructions per Myocardial Perfusion Study Information Sheet for the test on 01/02/18 at 1030 per Phone interpreter Loc [phone]. Patient notified to arrive 15 minutes early and that it is imperative to arrive on time for appointment to keep from having the test rescheduled.  If you need to cancel or reschedule your appointment, please call the office within 24 hours of your appointment. . Patient verbalized understanding.Chad [name], [name]

## 2018-01-02 ENCOUNTER — Ambulatory Visit (HOSPITAL_COMMUNITY): Payer: BLUE CROSS/BLUE SHIELD | Attending: Cardiology

## 2018-01-02 ENCOUNTER — Telehealth: Payer: Self-pay | Admitting: *Deleted

## 2018-01-02 DIAGNOSIS — I2510 Atherosclerotic heart disease of native coronary artery without angina pectoris: Secondary | ICD-10-CM | POA: Diagnosis not present

## 2018-01-02 DIAGNOSIS — I2584 Coronary atherosclerosis due to calcified coronary lesion: Secondary | ICD-10-CM | POA: Insufficient documentation

## 2018-01-02 LAB — MYOCARDIAL PERFUSION IMAGING
CHL CUP MPHR: 173 {beats}/min
CHL CUP NUCLEAR SDS: 1
CHL CUP NUCLEAR SSS: 1
CHL CUP RESTING HR STRESS: 56 {beats}/min
CSEPED: 9 min
CSEPPHR: 151 {beats}/min
Estimated workload: 10.1 METS
Exercise duration (sec): 0 s
LV sys vol: 43 mL
LVDIAVOL: 90 mL (ref 62–150)
NUC STRESS TID: 0.97
Percent HR: 87 %
SRS: 0

## 2018-01-02 MED ORDER — TECHNETIUM TC 99M TETROFOSMIN IV KIT
31.0000 | PACK | Freq: Once | INTRAVENOUS | Status: AC | PRN
  Administered 2018-01-02: 31 via INTRAVENOUS
  Filled 2018-01-02: qty 31

## 2018-01-02 MED ORDER — TECHNETIUM TC 99M TETROFOSMIN IV KIT
10.4000 | PACK | Freq: Once | INTRAVENOUS | Status: AC | PRN
  Administered 2018-01-02: 10.4 via INTRAVENOUS
  Filled 2018-01-02: qty 11

## 2018-01-02 NOTE — Telephone Encounter (Signed)
-----   Message from Beatrice Lecher, [place] sent at 01/02/2018  2:21 PM EDT ----- Please tell the patient his stress test is normal. Recommendations:  - Continue current medications and follow up as planned.   - Please send a copy to his PCP.  Tereso Newcomer, PA-C    01/02/2018 2:19 PM

## 2018-01-02 NOTE — Telephone Encounter (Signed)
DPR ok to s/w pt's wife who has been notified of normal Myoview results. Pt's wife asked if I would mail a copy of results to her and the pt. I verified address and will mail out results to pt today. I also confirmed Dr. Ladona Ridgel appt 01/18/18. Pt's wife thanked me for the call.

## 2018-01-14 ENCOUNTER — Telehealth: Payer: Self-pay

## 2018-01-14 ENCOUNTER — Encounter: Payer: BLUE CROSS/BLUE SHIELD | Admitting: *Deleted

## 2018-01-14 NOTE — Telephone Encounter (Signed)
I spoke with the patient about this.

## 2018-01-17 ENCOUNTER — Ambulatory Visit (INDEPENDENT_AMBULATORY_CARE_PROVIDER_SITE_OTHER): Payer: BLUE CROSS/BLUE SHIELD | Admitting: *Deleted

## 2018-01-17 DIAGNOSIS — I498 Other specified cardiac arrhythmias: Secondary | ICD-10-CM

## 2018-01-17 NOTE — Progress Notes (Signed)
Remote ICD transmission.   

## 2018-01-18 ENCOUNTER — Encounter: Payer: Self-pay | Admitting: Internal Medicine

## 2018-01-18 ENCOUNTER — Ambulatory Visit (INDEPENDENT_AMBULATORY_CARE_PROVIDER_SITE_OTHER): Payer: BLUE CROSS/BLUE SHIELD | Admitting: Internal Medicine

## 2018-01-18 VITALS — BP 120/86 | HR 56 | Ht 67.0 in | Wt 170.0 lb

## 2018-01-18 DIAGNOSIS — I498 Other specified cardiac arrhythmias: Secondary | ICD-10-CM | POA: Diagnosis not present

## 2018-01-18 DIAGNOSIS — Z95810 Presence of automatic (implantable) cardiac defibrillator: Secondary | ICD-10-CM | POA: Diagnosis not present

## 2018-01-18 NOTE — Patient Instructions (Signed)

## 2018-01-18 NOTE — Progress Notes (Signed)
HPI Chad [name] returns today after a year from our EP clinic for ongoing evaluation of his ventricular arrhythmias s/p ICD insertion. He has a h/o a broken lead and had an extraction and insertion of a new device. He developed a swollen left arm after surgery and was diagnosed with an occluded vein. Over time his left arm swelling improved. He had episodic pain and swelling in his neck, shoulder and chest on the left with has now almost resolved.  No Known Allergies   Current Outpatient Medications  Medication Sig Dispense Refill  . aspirin EC 81 MG tablet Take 1 tablet (81 mg total) by mouth daily.    Marland Kitchen atorvastatin (LIPITOR) 20 MG tablet Take 1 tablet by mouth daily.  0  . MULTIPLE VITAMIN PO Take 1 tablet by mouth daily.    . cetirizine (ZYRTEC) 10 MG tablet Take 1 tablet (10 mg total) by mouth daily. (Patient not taking: Reported on 01/18/2018) 30 tablet 11  . triamcinolone (NASACORT) 55 MCG/ACT AERO nasal inhaler Place 2 sprays into the nose daily. (Patient not taking: Reported on 01/18/2018) 1 Inhaler 12   No current facility-administered medications for this visit.      Past Medical History:  Diagnosis Date  . AICD (automatic cardioverter/defibrillator) present   . Brugada syndrome    s/p ICD 12/07  . DVT (deep venous thrombosis) (HCC)   . Dysrhythmia   . Echocardiogram    Echo 10/19: EF 60-65, no RWMA, mild LAE, mild TR, PASP 33  . History of Carotid Doppler ultrasound    Carotid US 10/19: normal  . Hyperlipidemia   . Presence of permanent cardiac pacemaker   . Syncope    s/p ICD 12/07    ROS:   All systems reviewed and negative except as noted in the HPI.   Past Surgical History:  Procedure Laterality Date  . BACK SURGERY    . CARDIAC DEFIBRILLATOR PLACEMENT  02/2006   St. Jude Atlas  . FINGER TENDON REPAIR Left    2nd digit  . ICD GENERATOR CHANGE  03/14/2016   ICD GENERATOR REMOVAL AND INSERTION OF NEW ICD, WITH LEAD  REMOVAL EXTRACTION Hattie Perch 03/14/2016    . ICD LEAD REMOVAL N/A 03/14/2016   Procedure: ICD GENERATOR REMOVAL AND INSERTION OF NEW ICD, WITH LEAD  REMOVAL EXTRACTION;  Surgeon: Marinus Maw, MD;  Location: [facility] OR;  Service: Cardiovascular;  Laterality: N/A;  . LUMBAR DISC SURGERY  1997  . TEE WITHOUT CARDIOVERSION N/A 03/14/2016   Procedure: TRANSESOPHAGEAL ECHOCARDIOGRAM (TEE);  Surgeon: Marinus Maw, MD;  Location: [facility] OR;  Service: Cardiovascular;  Laterality: N/A;  . UPPER EXTREMITY VENOGRAPHY Bilateral 05/12/2016   Procedure: Central Venography;  Surgeon: Sherren Kerns, MD;  Location: [facility] INVASIVE CV LAB;  Service: Cardiovascular;  Laterality: Bilateral;     Family History  Problem Relation Age of Onset  . Hyperlipidemia Mother   . Heart disease Mother   . Diabetes Mother      Social History   Socioeconomic History  . Marital status: Divorced    Spouse name: Not on file  . Number of children: 2  . Years of education: GED  . Highest education level: Not on file  Occupational History    Employer: [employer]    Comment: [employer]  Social Needs  . Financial resource strain: Not on file  . Food insecurity:    Worry: Not on file    Inability: Not on file  .  Transportation needs:    Medical: Not on file    Non-medical: Not on file  Tobacco Use  . Smoking status: Former Smoker    Packs/day: 1.00    Years: 23.00    Pack years: 23.00    Types: Cigarettes    Last attempt to quit: 03/14/2011    Years since quitting: 6.8  . Smokeless tobacco: Never Used  Substance and Sexual Activity  . Alcohol use: Yes    Alcohol/week: 6.0 standard drinks    Types: 6 Cans of beer per week  . Drug use: No  . Sexual activity: Never  Lifestyle  . Physical activity:    Days per week: Not on file    Minutes per session: Not on file  . Stress: Not on file  Relationships  . Social connections:    Talks on phone: Not on file    Gets together: Not on file    Attends religious service: Not on file    Active member of club or  organization: Not on file    Attends meetings of clubs or organizations: Not on file    Relationship status: Not on file  . Intimate partner violence:    Fear of current or ex partner: Not on file    Emotionally abused: Not on file    Physically abused: Not on file    Forced sexual activity: Not on file  Other Topics Concern  . Not on file  Social History Narrative   Married     BP 120/86   Pulse (!) 56   Ht 5\' 7"  (1.702 m)   Wt 170 lb (77.1 kg)   SpO2 97%   BMI 26.63 kg/m   Physical Exam:  Well appearing NAD HEENT: Unremarkable Neck:  No JVD, no thyromegally Lymphatics:  No adenopathy Back:  No CVA tenderness Lungs:  Clear with no wheezes HEART:  Regular rate rhythm, no murmurs, no rubs, no clicks Abd:  soft, positive bowel sounds, no organomegally, no rebound, no guarding Ext:  2 plus pulses, no edema, no cyanosis, no clubbing Skin:  No rashes no nodules Neuro:  CN II through XII intact, motor grossly intact  EKG - nsr  DEVICE  Normal device function.  See PaceArt for details.   Assess/Plan: 1. Brugada syndrome - he has had no ventricular arrhythmias. 2. VT - he has had none since his last visit 3. ICD - his St. Jude device is working normally. 4. Arm swelling/occluded SCV - his echo, ultrasound and stress test were reveiwed and reassuring. His left arm is essentially the same size as the right arm.  [name].D.

## 2018-01-20 ENCOUNTER — Encounter: Payer: Self-pay | Admitting: Cardiology

## 2018-01-23 LAB — CUP PACEART INCLINIC DEVICE CHECK
Brady Statistic RV Percent Paced: 0 %
Date Time Interrogation Session: 20191108150909
HIGH POWER IMPEDANCE MEASURED VALUE: 84.375
Implantable Lead Implant Date: 20180102
Lead Channel Pacing Threshold Amplitude: 1 V
Lead Channel Pacing Threshold Amplitude: 1 V
Lead Channel Sensing Intrinsic Amplitude: 10.1 mV
Lead Channel Setting Pacing Amplitude: 2.5 V
Lead Channel Setting Pacing Pulse Width: 0.5 ms
MDC IDC LEAD LOCATION: 753860
MDC IDC MSMT BATTERY REMAINING LONGEVITY: 86 mo
MDC IDC MSMT LEADCHNL RV IMPEDANCE VALUE: 475 Ohm
MDC IDC MSMT LEADCHNL RV PACING THRESHOLD PULSEWIDTH: 0.5 ms
MDC IDC MSMT LEADCHNL RV PACING THRESHOLD PULSEWIDTH: 0.5 ms
MDC IDC PG IMPLANT DT: 20180102
MDC IDC SET LEADCHNL RV SENSING SENSITIVITY: 0.5 mV
Pulse Gen Serial Number: 1246759

## 2018-03-20 ENCOUNTER — Encounter: Payer: Self-pay | Admitting: Internal Medicine

## 2018-03-20 ENCOUNTER — Ambulatory Visit: Payer: BLUE CROSS/BLUE SHIELD | Admitting: Internal Medicine

## 2018-03-20 ENCOUNTER — Other Ambulatory Visit (INDEPENDENT_AMBULATORY_CARE_PROVIDER_SITE_OTHER): Payer: BLUE CROSS/BLUE SHIELD

## 2018-03-20 ENCOUNTER — Telehealth: Payer: Self-pay | Admitting: Internal Medicine

## 2018-03-20 VITALS — BP 122/82 | HR 67 | Temp 97.9°F | Ht 67.0 in | Wt 170.0 lb

## 2018-03-20 DIAGNOSIS — B002 Herpesviral gingivostomatitis and pharyngotonsillitis: Secondary | ICD-10-CM | POA: Diagnosis not present

## 2018-03-20 DIAGNOSIS — Z202 Contact with and (suspected) exposure to infections with a predominantly sexual mode of transmission: Secondary | ICD-10-CM

## 2018-03-20 DIAGNOSIS — E785 Hyperlipidemia, unspecified: Secondary | ICD-10-CM

## 2018-03-20 LAB — HEPATIC FUNCTION PANEL
ALBUMIN: 4.4 g/dL (ref 3.5–5.2)
ALK PHOS: 45 U/L (ref 39–117)
ALT: 60 U/L — ABNORMAL HIGH (ref 0–53)
AST: 37 U/L (ref 0–37)
Bilirubin, Direct: 0.1 mg/dL (ref 0.0–0.3)
Total Bilirubin: 1.1 mg/dL (ref 0.2–1.2)
Total Protein: 7.2 g/dL (ref 6.0–8.3)

## 2018-03-20 LAB — LIPID PANEL
Cholesterol: 154 mg/dL (ref 0–200)
HDL: 49.5 mg/dL (ref >39.00)
NonHDL: 104.65
Total CHOL/HDL Ratio: 3
Triglycerides: 227 mg/dL — ABNORMAL HIGH (ref 0.0–149.0)
VLDL: 45.4 mg/dL — ABNORMAL HIGH (ref 0.0–40.0)

## 2018-03-20 LAB — LDL CHOLESTEROL, DIRECT: Direct LDL: 72 mg/dL

## 2018-03-20 MED ORDER — VALACYCLOVIR HCL 1 G PO TABS: 1000.0000 mg | ORAL_TABLET | Freq: Two times a day (BID) | ORAL | 5 refills | Status: DC | PRN

## 2018-03-20 NOTE — Patient Instructions (Signed)
Please take all new medication as prescribed - the antibiotic pills to use only if the Cold Sores come back  You can also use OTC Abreva for the cold sores as well as needed  Please continue all other medications as before, and refills have been done if requested.  Please have the pharmacy call with any other refills you may need.  Please continue your efforts at being more active, low cholesterol diet, and weight control  Please keep your appointments with your specialists as you may have planned  Please go to the LAB in the Basement (turn left off the elevator) for the tests to be done today  You will be contacted by phone if any changes need to be made immediately.  Otherwise, you will receive a letter about your results with an explanation, but please check with MyChart first.  Please remember to sign up for MyChart if you have not done so, as this will be important to you in the future with finding out test results, communicating by private email, and scheduling acute appointments online when needed.

## 2018-03-20 NOTE — Progress Notes (Signed)
Subjective:    Patient ID: Chad [name], male    DOB: 01/13/1971, 48 y.o.   MRN: [id number]  HPI  Here to f/u; overall doing ok,  Pt denies chest pain, increasing sob or doe, wheezing, orthopnea, PND, increased LE swelling, palpitations, dizziness or syncope.  Pt denies new neurological symptoms such as new headache, or facial or extremity weakness or numbness.  Pt denies polydipsia, polyuria, or low sugar episode.  Pt states overall good compliance with meds, mostly trying to follow appropriate diet, with wt overall stable,  but little exercise however.  Does have oral herpes lesions to right upper lateral lip for 2 days, quite painful, tends to recur.  Asks for HIV testing after recent unprotected intercourse.   Pt denies fever, wt loss, night sweats, loss of appetite, or other constitutional symptoms  Denies urinary symptoms such as dysuria, frequency, urgency, flank pain, hematuria or n/v, fever, chills. Past Medical History:  Diagnosis Date  . AICD (automatic cardioverter/defibrillator) present   . Brugada syndrome    s/p ICD 12/07  . DVT (deep venous thrombosis) (HCC)   . Dysrhythmia   . Echocardiogram    Echo 10/19: EF 60-65, no RWMA, mild LAE, mild TR, PASP 33  . History of Carotid Doppler ultrasound    Carotid US 10/19: normal  . Hyperlipidemia   . Presence of permanent cardiac pacemaker   . Syncope    s/p ICD 12/07   Past Surgical History:  Procedure Laterality Date  . BACK SURGERY    . CARDIAC DEFIBRILLATOR PLACEMENT  02/2006   St. Jude Atlas  . FINGER TENDON REPAIR Left    2nd digit  . ICD GENERATOR CHANGE  03/14/2016   ICD GENERATOR REMOVAL AND INSERTION OF NEW ICD, WITH LEAD  REMOVAL EXTRACTION Hattie Perch 03/14/2016  . ICD LEAD REMOVAL N/A 03/14/2016   Procedure: ICD GENERATOR REMOVAL AND INSERTION OF NEW ICD, WITH LEAD  REMOVAL EXTRACTION;  Surgeon: Marinus Maw, MD;  Location: [facility] OR;  Service: Cardiovascular;  Laterality: N/A;  . LUMBAR DISC SURGERY  1997  . TEE WITHOUT  CARDIOVERSION N/A 03/14/2016   Procedure: TRANSESOPHAGEAL ECHOCARDIOGRAM (TEE);  Surgeon: Marinus Maw, MD;  Location: [facility] OR;  Service: Cardiovascular;  Laterality: N/A;  . UPPER EXTREMITY VENOGRAPHY Bilateral 05/12/2016   Procedure: Central Venography;  Surgeon: Sherren Kerns, MD;  Location: [facility] INVASIVE CV LAB;  Service: Cardiovascular;  Laterality: Bilateral;    reports that he quit smoking about 7 years ago. His smoking use included cigarettes. He has a 23.00 pack-year smoking history. He has never used smokeless tobacco. He reports current alcohol use of about 6.0 standard drinks of alcohol per week. He reports that he does not use drugs. family history includes Diabetes in his mother; Heart disease in his mother; Hyperlipidemia in his mother. No Known Allergies Current Outpatient Medications on File Prior to Visit  Medication Sig Dispense Refill  . ASPIRIN 81 PO Take by mouth.    Marland Kitchen atorvastatin (LIPITOR) 20 MG tablet Take 1 tablet by mouth daily.  0   No current facility-administered medications on file prior to visit.    Review of Systems  Constitutional: Negative for other unusual diaphoresis or sweats HENT: Negative for ear discharge or swelling Eyes: Negative for other worsening visual disturbances Respiratory: Negative for stridor or other swelling  Gastrointestinal: Negative for worsening distension or other blood Genitourinary: Negative for retention or other urinary change Musculoskeletal: Negative for other MSK pain or swelling Skin: Negative for color change  or other new lesions Neurological: Negative for worsening tremors and other numbness  Psychiatric/Behavioral: Negative for worsening agitation or other fatigue All other system neg per pt    Objective:   Physical Exam BP 122/82   Pulse 67   Temp 97.9 F (36.6 C) (Oral)   Ht 5\' 7"  (1.702 m)   Wt 170 lb (77.1 kg)   SpO2 96%   BMI 26.63 kg/m  VS noted,  Constitutional: Pt appears in NAD HENT: Head: NCAT.    Right Ear: External ear normal.  Left Ear: External ear normal.  Eyes: . Pupils are equal, round, and reactive to light. Conjunctivae and EOM are normal Nose: without d/c or deformity Neck: Neck supple. Gross normal ROM Cardiovascular: Normal rate and regular rhythm.   Pulmonary/Chest: Effort normal and breath sounds without rales or wheezing.  Abd:  Soft, NT, ND, + BS, no organomegaly Neurological: Pt is alert. At baseline orientation, motor grossly intact Skin: Skin is warm. No rashes, + new lesions herptic type right upper lateral lip tender, no LE edema Psychiatric: Pt behavior is normal without agitation  No other exam findings Lab Results  Component Value Date   WBC 10.8 12/20/2016   HGB 15.3 12/20/2016   HCT 45.6 12/20/2016   PLT 327 12/20/2016   GLUCOSE 94 12/20/2016   CHOL 154 03/20/2018   TRIG 227.0 (H) 03/20/2018   HDL 49.50 03/20/2018   LDLDIRECT 72.0 03/20/2018   LDLCALC 83 01/11/2016   ALT 60 (H) 03/20/2018   AST 37 03/20/2018   NA 141 12/20/2016   K 4.8 12/20/2016   CL 100 12/20/2016   CREATININE 0.82 12/20/2016   BUN 16 12/20/2016   CO2 26 12/20/2016   TSH 0.826 12/20/2016   HGBA1C 6.0 (H) 12/20/2016       Assessment & Plan:

## 2018-03-20 NOTE — Telephone Encounter (Signed)
Noted  

## 2018-03-20 NOTE — Telephone Encounter (Signed)
Please let patient know when he needs to come back once his lab results are back.

## 2018-03-21 LAB — CUP PACEART REMOTE DEVICE CHECK
Battery Remaining Longevity: 85 mo
Battery Remaining Percentage: 84 %
Brady Statistic RV Percent Paced: 1 %
Date Time Interrogation Session: 20191107105403
HIGH POWER IMPEDANCE MEASURED VALUE: 78 Ohm
HighPow Impedance: 78 Ohm
Implantable Lead Implant Date: 20180102
Implantable Lead Location: 753860
Implantable Lead Model: 7122
Implantable Pulse Generator Implant Date: 20180102
Lead Channel Impedance Value: 480 Ohm
Lead Channel Sensing Intrinsic Amplitude: 11.3 mV
Lead Channel Setting Pacing Amplitude: 2.5 V
Lead Channel Setting Pacing Pulse Width: 0.8 ms
Lead Channel Setting Sensing Sensitivity: 0.5 mV
MDC IDC MSMT BATTERY VOLTAGE: 3.01 V
MDC IDC MSMT LEADCHNL RV PACING THRESHOLD AMPLITUDE: 1 V
MDC IDC MSMT LEADCHNL RV PACING THRESHOLD PULSEWIDTH: 0.8 ms
Pulse Gen Serial Number: 1246759

## 2018-03-21 LAB — HIV ANTIBODY (ROUTINE TESTING W REFLEX): HIV 1&2 Ab, 4th Generation: NONREACTIVE

## 2018-03-23 DIAGNOSIS — Z202 Contact with and (suspected) exposure to infections with a predominantly sexual mode of transmission: Secondary | ICD-10-CM | POA: Insufficient documentation

## 2018-03-23 DIAGNOSIS — B002 Herpesviral gingivostomatitis and pharyngotonsillitis: Secondary | ICD-10-CM | POA: Insufficient documentation

## 2018-03-23 NOTE — Assessment & Plan Note (Signed)
For valtrex asd, also abreva prn future outbreaks,  to f/u any worsening symptoms or concerns

## 2018-03-23 NOTE — Assessment & Plan Note (Signed)
stable overall by history and exam, recent data reviewed with pt, and pt to continue medical treatment as before,  to f/u any worsening symptoms or concerns, for f/u lipids today 

## 2018-03-23 NOTE — Assessment & Plan Note (Signed)
Ok for HIV testing as reqeusted,  to f/u any worsening symptoms or concerns

## 2018-03-27 ENCOUNTER — Ambulatory Visit: Payer: BLUE CROSS/BLUE SHIELD | Admitting: Internal Medicine

## 2018-04-02 ENCOUNTER — Other Ambulatory Visit: Payer: Self-pay | Admitting: Internal Medicine

## 2018-04-02 MED ORDER — ATORVASTATIN CALCIUM 20 MG PO TABS: 20.0000 mg | ORAL_TABLET | Freq: Every day | ORAL | 5 refills | Status: DC

## 2018-04-02 NOTE — Telephone Encounter (Signed)
Dr. Jonny Ruiz can you advise. I do not see any lab notations.

## 2018-04-02 NOTE — Telephone Encounter (Signed)
Patient needs results from 03/20/2018?

## 2018-04-02 NOTE — Telephone Encounter (Signed)
Ok to let pt know, labs were essentially normal, no further evaluation or tx need to be done  OK to sent letter if he wants

## 2018-04-02 NOTE — Telephone Encounter (Signed)
Requested medication (s) are due for refill today: unsure- no data on amount dispensed  Requested medication (s) are on the active medication list: yes  Last refill:  12/05/17  Future visit scheduled: no  Notes to clinic:  Historical medication   Requested Prescriptions  Pending Prescriptions Disp Refills   atorvastatin (LIPITOR) 20 MG tablet  0    Sig: Take 1 tablet (20 mg total) by mouth daily.     Cardiovascular:  Antilipid - Statins Failed - 04/02/2018 10:37 AM      Failed - LDL in normal range and within 360 days    LDL Cholesterol  Date Value Ref Range Status  01/11/2016 83 <130 mg/dL Final    Comment:      Total Cholesterol/HDL Ratio:CHD Risk                        Coronary Heart Disease Risk Table                                        Men       Women          1/2 Average Risk              3.4        3.3              Average Risk              5.0        4.4           2X Average Risk              9.6        7.1           3X Average Risk             23.4       11.0 Use the calculated Patient Ratio above and the CHD Risk table  to determine the patient's CHD Risk.          Failed - Triglycerides in normal range and within 360 days    Triglycerides  Date Value Ref Range Status  03/20/2018 227.0 (H) 0.0 - 149.0 mg/dL Final    Comment:    Normal:  <150 mg/dLBorderline High:  150 - 199 mg/dL         Passed - Total Cholesterol in normal range and within 360 days    Cholesterol  Date Value Ref Range Status  03/20/2018 154 0 - 200 mg/dL Final    Comment:    ATP III Classification       Desirable:  < 200 mg/dL               Borderline High:  200 - 239 mg/dL          High:  > = 562 mg/dL         Passed - HDL in normal range and within 360 days    HDL  Date Value Ref Range Status  03/20/2018 49.50 >39.00 mg/dL Final         Passed - Patient is not pregnant      Passed - Valid encounter within last 12 months    Recent Outpatient Visits          1 week ago Oral  herpes   [facility] -Clair Gulling, [place]   3 months ago Encounter for well adult exam with abnormal findings   [facility] -Clair Gulling, MD   1 year ago Loss of weight   [facility] at [facility], [place], [place]   1 year ago Left upper extremity swelling   [facility] at [facility], [place]t Lukes, [place]   2 years ago Annual physical exam   Primary Care at [facility], Madelaine Bhat, [place]

## 2018-04-02 NOTE — Telephone Encounter (Signed)
Called pt, LVM with details below  

## 2018-04-02 NOTE — Telephone Encounter (Signed)
Copied from CRM [id number]. Topic: Quick Communication - Rx Refill/Question >> Apr 02, 2018 10:34 AM Zada Girt, Lumin L wrote: Medication: atorvastatin (LIPITOR) 20 MG tablet  (out of medication)  Has the patient contacted their pharmacy? Yes.   (Agent: If no, request that the patient contact the pharmacy for the refill.) (Agent: If yes, when and what did the pharmacy advise?)  Preferred Pharmacy (with phone number or street name): [id number] [place] Otto, [place] - [address] AT [facility] OF [facility] & [address] [place] [address] Phone: [phone] Fax: [phone]  Agent: Please be advised that RX refills may take up to 3 business days. We ask that you follow-up with your pharmacy.

## 2018-04-04 ENCOUNTER — Ambulatory Visit: Payer: Self-pay

## 2018-04-04 NOTE — Telephone Encounter (Signed)
Pt. Calling and asking for lab results from 03/20/18. Please notify pt. Once these have been released. Contact number [phone].

## 2018-04-04 NOTE — Telephone Encounter (Signed)
Reached out to patient again, informed pt labs were normal.

## 2018-04-08 ENCOUNTER — Emergency Department (HOSPITAL_COMMUNITY): Payer: BLUE CROSS/BLUE SHIELD

## 2018-04-08 ENCOUNTER — Other Ambulatory Visit: Payer: Self-pay

## 2018-04-08 ENCOUNTER — Emergency Department (HOSPITAL_COMMUNITY)
Admission: EM | Admit: 2018-04-08 | Discharge: 2018-04-09 | Disposition: A | Discharge status: Home / Self Care | Payer: BLUE CROSS/BLUE SHIELD | Attending: Emergency Medicine | Admitting: Emergency Medicine

## 2018-04-08 DIAGNOSIS — R7401 Elevation of levels of liver transaminase levels: Secondary | ICD-10-CM

## 2018-04-08 DIAGNOSIS — Z87891 Personal history of nicotine dependence: Secondary | ICD-10-CM | POA: Diagnosis not present

## 2018-04-08 DIAGNOSIS — R740 Nonspecific elevation of levels of transaminase and lactic acid dehydrogenase [LDH]: Secondary | ICD-10-CM | POA: Diagnosis not present

## 2018-04-08 DIAGNOSIS — R918 Other nonspecific abnormal finding of lung field: Secondary | ICD-10-CM | POA: Insufficient documentation

## 2018-04-08 DIAGNOSIS — Z79899 Other long term (current) drug therapy: Secondary | ICD-10-CM | POA: Insufficient documentation

## 2018-04-08 DIAGNOSIS — R0602 Shortness of breath: Secondary | ICD-10-CM | POA: Diagnosis present

## 2018-04-08 DIAGNOSIS — I471 Supraventricular tachycardia: Secondary | ICD-10-CM | POA: Diagnosis not present

## 2018-04-08 DIAGNOSIS — Z95810 Presence of automatic (implantable) cardiac defibrillator: Secondary | ICD-10-CM | POA: Diagnosis not present

## 2018-04-08 DIAGNOSIS — Z7982 Long term (current) use of aspirin: Secondary | ICD-10-CM | POA: Insufficient documentation

## 2018-04-08 LAB — COMPREHENSIVE METABOLIC PANEL
ALT: 68 U/L — ABNORMAL HIGH (ref 0–44)
AST: 62 U/L — ABNORMAL HIGH (ref 15–41)
Albumin: 4.3 g/dL (ref 3.5–5.0)
Alkaline Phosphatase: 49 U/L (ref 38–126)
Anion gap: 10 (ref 5–15)
BUN: 11 mg/dL (ref 6–20)
CO2: 23 mmol/L (ref 22–32)
Calcium: 9.4 mg/dL (ref 8.9–10.3)
Chloride: 103 mmol/L (ref 98–111)
Creatinine, Ser: 1.08 mg/dL (ref 0.61–1.24)
GFR calc non Af Amer: >60 mL/min (ref >60)
Glucose, Bld: 119 mg/dL — ABNORMAL HIGH (ref 70–99)
Potassium: 3.4 mmol/L — ABNORMAL LOW (ref 3.5–5.1)
SODIUM: 136 mmol/L (ref 135–145)
Total Bilirubin: 1 mg/dL (ref 0.3–1.2)
Total Protein: 7.3 g/dL (ref 6.5–8.1)

## 2018-04-08 LAB — CBC
HCT: 43.5 % (ref 39.0–52.0)
Hemoglobin: 14.3 g/dL (ref 13.0–17.0)
MCH: 30.6 pg (ref 26.0–34.0)
MCHC: 32.9 g/dL (ref 30.0–36.0)
MCV: 92.9 fL (ref 80.0–100.0)
Platelets: 376 10*3/uL (ref 150–400)
RBC: 4.68 MIL/uL (ref 4.22–5.81)
RDW: 11.9 % (ref 11.5–15.5)
WBC: 10.9 10*3/uL — ABNORMAL HIGH (ref 4.0–10.5)
nRBC: 0 % (ref 0.0–0.2)

## 2018-04-08 LAB — I-STAT TROPONIN, ED
Troponin i, poc: 0 ng/mL (ref 0.00–0.08)
Troponin i, poc: 0 ng/mL (ref 0.00–0.08)

## 2018-04-08 LAB — LIPASE, BLOOD: Lipase: 44 U/L (ref 11–51)

## 2018-04-08 LAB — BRAIN NATRIURETIC PEPTIDE: B Natriuretic Peptide: 21.9 pg/mL (ref 0.0–100.0)

## 2018-04-08 MED ORDER — SODIUM CHLORIDE 0.9% FLUSH
3.0000 mL | Freq: Once | INTRAVENOUS | Status: AC
  Administered 2018-04-08: 3 mL via INTRAVENOUS

## 2018-04-08 MED ORDER — IOPAMIDOL (ISOVUE-370) INJECTION 76%
100.0000 mL | Freq: Once | INTRAVENOUS | Status: AC
  Administered 2018-04-08: 65 mL via INTRAVENOUS

## 2018-04-08 MED ORDER — ASPIRIN 81 MG PO CHEW
243.0000 mg | CHEWABLE_TABLET | Freq: Once | ORAL | Status: AC
  Administered 2018-04-08: 243 mg via ORAL
  Filled 2018-04-08: qty 3

## 2018-04-08 MED ORDER — POTASSIUM CHLORIDE CRYS ER 20 MEQ PO TBCR
40.0000 meq | EXTENDED_RELEASE_TABLET | Freq: Once | ORAL | Status: AC
  Administered 2018-04-08: 40 meq via ORAL
  Filled 2018-04-08: qty 2

## 2018-04-08 NOTE — Discharge Instructions (Addendum)
Continue to take your home medicines as prescribed.  Drink plenty fluids and get plenty of rest.  Follow-up with your cardiologist for reevaluation of your symptoms.  I have attached information about the abnormal rhythm your heart went into in your paperwork today.If you start to feel as though your heart is palpitating, you can slow your heart rate down by bearing-down is that you are going to have a bowel movement or blowing out into a straw for 10 seconds.     Your scan today did not show any evidence of a blood clot in the lungs but it does show some pulmonary nodules that have been present since September 2019. Follow-up with your primary care physician or cardiologist for reevaluation of these.    Return to the emergency department immediately if any concerning signs or symptoms develop such as chest pains, persistent shortness of breath, lightheadedness, persistent vomiting, or passing out.

## 2018-04-08 NOTE — ED Notes (Signed)
Pt staets he understands instructions. Home stable with steady gait with family.

## 2018-04-08 NOTE — ED Provider Notes (Signed)
Received patient at signout from PA Muthersbaugh.  Refer to provider note for full history and physical examination.  Briefly, patient is a 48 year old male with history of Brugada syndrome with ICD presenting for acute onset shortness of breath beginning at around noon.  Serial troponins negative, EKG consistent with Brugada.  Awaiting ICD interrogation and PE study.  If abnormal, plan to call cardiology for recommendations. Physical Exam  BP 134/86 (BP Location: Right Arm)   Pulse 60   Temp 98.7 F (37.1 C)   Resp 17   Ht 5\' 7"  (1.702 m)   Wt 77.1 kg   SpO2 98%   BMI 26.63 kg/m   Physical Exam Vitals signs and nursing note reviewed.  Constitutional:      General: He is not in acute distress.    Appearance: He is well-developed.  HENT:     Head: Normocephalic and atraumatic.  Eyes:     General:        Right eye: No discharge.        Left eye: No discharge.     Conjunctiva/sclera: Conjunctivae normal.  Neck:     Vascular: No JVD.     Trachea: No tracheal deviation.  Cardiovascular:     Rate and Rhythm: Normal rate.  Pulmonary:     Effort: Pulmonary effort is normal.     Comments: Resting comfortably, speaking in full sentences without difficulty. Abdominal:     General: There is no distension.  Skin:    General: Skin is warm and dry.     Findings: No erythema.  Neurological:     Mental Status: He is alert.  Psychiatric:        Behavior: Behavior normal.      MDM  No evidence on PE on study today.  He does have stable pulmonary nodules on imaging which have been present since September 2019.  Patient continues to rest comfortably, no apparent distress.  His ICD interrogation reveals that he had an episode of SVT lasting 24 seconds with a rate of 164 bpm at 12:52 PM yesterday.  Spoke with Dr. Graciela Husbands with EP cardiology who does not recommend initiating any medications presently.  Patient has no complaints on reassessment reports he is feeling much better.  Stable for  discharge home with follow-up with his cardiologist for reevaluation.  Discussed strict ED return precautions.  Also discussed vagal maneuvers in the event he were to become symptomatic again.  Pt verbalized understanding of and agreement with plan and is safe for discharge home at this time.      Jeanie Sewer, PA-C 04/08/18 [id number]    Jacalyn Lefevre, MD 04/09/18 [phone]

## 2018-04-08 NOTE — ED Triage Notes (Signed)
Per pr he has been having sob all day and weakness. Pt stated he had no chest pain or N/V today. Stated heavy feeling in chest after eating today at 12.

## 2018-04-08 NOTE — ED Provider Notes (Signed)
[facility] EMERGENCY DEPARTMENT Provider Note   CSN: [id number] Arrival date & time: 04/08/18  0303     History   Chief Complaint Chief Complaint  Patient presents with  . Shortness of Breath    HPI Chad [name] is a 48 y.o. male with a hx of Brugada syndrome, AICD, DVT, dysrhythmia, chronic DVT presents to the Emergency Department complaining of gradual, persistent, progressively worsening shortness of breath around 12 noon today.  Patient reports he has not had any associated chest pain however his shortness of breath has continued to worsen throughout the evening preventing him from sleeping.  Patient denies recent illness including fever, chills, cough, nasal congestion, rhinorrhea.  He denies swelling of his legs, recent travel.  He denies headache, neck pain, abdominal pain, nausea, vomiting, diarrhea, weakness, dizziness, syncope.  No aggravating or alleviating factors at this time.  Patient took 81 mg of aspirin prior to EMS arrival.   The history is provided by the patient, medical records and the EMS personnel. No language interpreter was used.    Past Medical History:  Diagnosis Date  . AICD (automatic cardioverter/defibrillator) present   . Brugada syndrome    s/p ICD 12/07  . DVT (deep venous thrombosis) (HCC)   . Dysrhythmia   . Echocardiogram    Echo 10/19: EF 60-65, no RWMA, mild LAE, mild TR, PASP 33  . History of Carotid Doppler ultrasound    Carotid US 10/19: normal  . Hyperlipidemia   . Presence of permanent cardiac pacemaker   . Syncope    s/p ICD 12/07    Patient Active Problem List   Diagnosis Date Noted  . Oral herpes 03/23/2018  . STD exposure 03/23/2018  . Encounter for well adult exam with abnormal findings 12/12/2017  . Dizziness 12/12/2017  . Internal hemorrhoids 01/03/2017  . ICD (implantable cardioverter-defibrillator) in place 03/14/2016  . Seasonal allergies 11/25/2014  . Hyperlipidemia 06/17/2014  . DVT of axillary  vein, chronic left (HCC) 04/15/2014  . Anxiety 04/15/2014  . Brugada syndrome 09/01/2012  . Automatic implantable cardioverter-defibrillator in situ 07/27/2009  . OTHER SPECIFIED CONGENITAL ANOMALY HEART OTHER 02/01/2009    Past Surgical History:  Procedure Laterality Date  . BACK SURGERY    . CARDIAC DEFIBRILLATOR PLACEMENT  02/2006   St. Jude Atlas  . FINGER TENDON REPAIR Left    2nd digit  . ICD GENERATOR CHANGE  03/14/2016   ICD GENERATOR REMOVAL AND INSERTION OF NEW ICD, WITH LEAD  REMOVAL EXTRACTION Hattie Perch 03/14/2016  . ICD LEAD REMOVAL N/A 03/14/2016   Procedure: ICD GENERATOR REMOVAL AND INSERTION OF NEW ICD, WITH LEAD  REMOVAL EXTRACTION;  Surgeon: Marinus Maw, MD;  Location: [facility] OR;  Service: Cardiovascular;  Laterality: N/A;  . LUMBAR DISC SURGERY  1997  . TEE WITHOUT CARDIOVERSION N/A 03/14/2016   Procedure: TRANSESOPHAGEAL ECHOCARDIOGRAM (TEE);  Surgeon: Marinus Maw, MD;  Location: [facility] OR;  Service: Cardiovascular;  Laterality: N/A;  . UPPER EXTREMITY VENOGRAPHY Bilateral 05/12/2016   Procedure: Central Venography;  Surgeon: Sherren Kerns, MD;  Location: [facility] INVASIVE CV LAB;  Service: Cardiovascular;  Laterality: Bilateral;        Home Medications    Prior to Admission medications   Medication Sig Start Date End Date Taking? Authorizing Provider  aspirin EC 81 MG tablet Take 81 mg by mouth daily.   Yes [provider]  atorvastatin (LIPITOR) 20 MG tablet Take 1 tablet (20 mg total) by mouth daily. 04/02/18  Yes John,  Len BlalockJames W, MD  valACYclovir (VALTREX) 1000 MG tablet Take 1 tablet (1,000 mg total) by mouth 2 (two) times daily as needed. 03/20/18  Yes Corwin LevinsJohn, James W, MD    Family History Family History  Problem Relation Age of Onset  . Hyperlipidemia Mother   . Heart disease Mother   . Diabetes Mother     Social History Social History   Tobacco Use  . Smoking status: Former Smoker    Packs/day: 1.00    Years: 23.00    Pack years: 23.00    Types:  Cigarettes    Last attempt to quit: 03/14/2011    Years since quitting: 7.0  . Smokeless tobacco: Never Used  Substance Use Topics  . Alcohol use: Yes    Alcohol/week: 6.0 standard drinks    Types: 6 Cans of beer per week  . Drug use: No     Allergies   Patient has no known allergies.   Review of Systems Review of Systems  Constitutional: Negative for appetite change, diaphoresis, fatigue, fever and unexpected weight change.  HENT: Negative for mouth sores.   Eyes: Negative for visual disturbance.  Respiratory: Positive for shortness of breath. Negative for cough, chest tightness and wheezing.   Cardiovascular: Negative for chest pain.  Gastrointestinal: Negative for abdominal pain, constipation, diarrhea, nausea and vomiting.  Endocrine: Negative for polydipsia, polyphagia and polyuria.  Genitourinary: Negative for dysuria, frequency, hematuria and urgency.  Musculoskeletal: Negative for back pain and neck stiffness.  Skin: Negative for rash.  Allergic/Immunologic: Negative for immunocompromised state.  Neurological: Negative for syncope, light-headedness and headaches.  Hematological: Does not bruise/bleed easily.  Psychiatric/Behavioral: Negative for sleep disturbance. The patient is nervous/anxious.      Physical Exam Updated Vital Signs BP (!) 160/99 (BP Location: Right Arm)   Pulse 82   Temp 98.7 F (37.1 C)   Resp 19   Ht 5\' 7"  (1.702 m)   Wt 77.1 kg   SpO2 100%   BMI 26.63 kg/m   Physical Exam Vitals signs and nursing note reviewed.  Constitutional:      General: He is not in acute distress.    Appearance: He is well-developed. He is not diaphoretic.     Comments: Awake, alert, nontoxic appearance  HENT:     Head: Normocephalic and atraumatic.     Mouth/Throat:     Pharynx: No oropharyngeal exudate.  Eyes:     General: No scleral icterus.    Conjunctiva/sclera: Conjunctivae normal.  Neck:     Musculoskeletal: Normal range of motion and neck supple.    Cardiovascular:     Rate and Rhythm: Normal rate and regular rhythm.  Pulmonary:     Effort: Pulmonary effort is normal. Tachypnea present. No accessory muscle usage or respiratory distress.     Breath sounds: Normal breath sounds. No stridor. No decreased breath sounds, wheezing, rhonchi or rales.  Abdominal:     General: Bowel sounds are normal.     Palpations: Abdomen is soft. There is no mass.     Tenderness: There is no abdominal tenderness. There is no guarding or rebound.  Musculoskeletal: Normal range of motion.  Skin:    General: Skin is warm and dry.  Neurological:     Mental Status: He is alert.     Comments: Speech is clear and goal oriented Moves extremities without ataxia  Psychiatric:        Mood and Affect: Mood is anxious.      ED Treatments / Results  Labs (all labs ordered are listed, but only abnormal results are displayed) Labs Reviewed  CBC - Abnormal; Notable for the following components:      Result Value   WBC 10.9 (*)    All other components within normal limits  COMPREHENSIVE METABOLIC PANEL - Abnormal; Notable for the following components:   Potassium 3.4 (*)    Glucose, Bld 119 (*)    AST 62 (*)    ALT 68 (*)    All other components within normal limits  BRAIN NATRIURETIC PEPTIDE  LIPASE, BLOOD  I-STAT TROPONIN, ED  I-STAT TROPONIN, ED    EKG EKG Interpretation  Date/Time:  Monday April 08 2018 03:09:18 EST Ventricular Rate:  80 PR Interval:    QRS Duration: 118 QT Interval:  412 QTC Calculation: 476 R Axis:   14 Text Interpretation:  Sinus rhythm Short PR interval Incomplete right bundle branch block Probable anteroseptal infarct, acute brugada Confirmed by Jacalyn Lefevre [phone]) on 04/08/2018 5:24:36 AM      Radiology Dg Chest 2 View  Result Date: 04/08/2018 CLINICAL DATA:  Shortness of breath EXAM: CHEST - 2 VIEW COMPARISON:  04/26/2016 FINDINGS: Left-sided pacing device as before. No acute airspace disease or pleural  effusion. Normal cardiomediastinal silhouette. No pneumothorax. IMPRESSION: No active cardiopulmonary disease. Electronically Signed   By: Jasmine Pang M.D.   On: 04/08/2018 03:48    Procedures Procedures (including critical care time)  Medications Ordered in ED Medications  aspirin chewable tablet 243 mg (243 mg Oral Given 04/08/18 0436)  sodium chloride flush (NS) 0.9 % injection 3 mL (3 mLs Intravenous Given 04/08/18 0437)  potassium chloride SA (K-DUR,KLOR-CON) CR tablet 40 mEq (40 mEq Oral Given 04/08/18 9622)     Initial Impression / Assessment and Plan / ED Course  I have reviewed the triage vital signs and the nursing notes.  Pertinent labs & imaging results that were available during my care of the patient were reviewed by me and considered in my medical decision making (see chart for details).  Clinical Course as of Apr 08 637  Mon Apr 08, 2018  0319 Dr. Particia Nearing discussed patient's current EKG with cardiology fellow who reports this is classic for Brugada and does not wish to have a STEMI called.  Will proceed with chest pain work-up.   [HM]  [id number] Patient reports he is feeling significantly better.  His vital signs have remained stable.   [HM]    Clinical Course User Index [HM] Alleen Kehm, Boyd Kerbs    Presents with shortness of breath.  He is denying chest pain.  EKG is concerning however after discussion with cardiology they feel this is classic for Brugada's which patient has a history of.  Additionally patient has AICD.  He denies firing of his AICD tonight.  Initial work-up is reassuring.  Initial troponin is negative after more than 12 hours of shortness of breath.  Chest x-ray is without evidence of pneumonia, pneumothorax or pulmonary edema.  Patient is without fever, chills, upper respiratory symptoms, flulike illness, cough or recent travel.  Mild leukocytosis and mild hypokalemia are noted but labs are otherwise reassuring.  AST and ALT are elevated but appear  to have been elevated in the past as well.  She is without abdominal pain, nausea or vomiting.  6:15 AM  Pt has improved, but SOB has not resolved.  Awaiting interrogation of his AICD.  Pt has a chronic DVT and is not anticoagulated.  Less likely to PE, however will obtain CTA  chest to r/o PE as cause of current shortness of breath.    [facility] shift change [facility] who will follow pending studies, re-evaulate and determine disposition.      Final Clinical Impressions(s) / ED Diagnoses   Final diagnoses:  SOB (shortness of breath)  Elevated transaminase level    ED Discharge Orders    None       Mardene SayerMuthersbaugh, Boyd KerbsHannah, PA-C 04/08/18 [id number]    Jacalyn LefevreHaviland, Julie, MD 04/08/18 [phone]

## 2018-04-22 ENCOUNTER — Ambulatory Visit (INDEPENDENT_AMBULATORY_CARE_PROVIDER_SITE_OTHER): Payer: BLUE CROSS/BLUE SHIELD

## 2018-04-22 DIAGNOSIS — I429 Cardiomyopathy, unspecified: Secondary | ICD-10-CM

## 2018-04-22 DIAGNOSIS — R55 Syncope and collapse: Secondary | ICD-10-CM

## 2018-04-22 DIAGNOSIS — I428 Other cardiomyopathies: Secondary | ICD-10-CM

## 2018-04-23 LAB — CUP PACEART REMOTE DEVICE CHECK
Battery Remaining Longevity: 84 mo
Battery Voltage: 3.01 V
Brady Statistic RV Percent Paced: 1 %
Date Time Interrogation Session: 20200209092550
HighPow Impedance: 77 Ohm
HighPow Impedance: 77 Ohm
Implantable Lead Implant Date: 20180102
Implantable Lead Location: 753860
Implantable Lead Model: 7122
Implantable Pulse Generator Implant Date: 20180102
Lead Channel Impedance Value: 480 Ohm
Lead Channel Pacing Threshold Amplitude: 1 V
Lead Channel Pacing Threshold Pulse Width: 0.5 ms
Lead Channel Sensing Intrinsic Amplitude: 11.7 mV
Lead Channel Setting Pacing Amplitude: 2.5 V
Lead Channel Setting Pacing Pulse Width: 0.5 ms
Lead Channel Setting Sensing Sensitivity: 0.5 mV
MDC IDC MSMT BATTERY REMAINING PERCENTAGE: 81 %
MDC IDC PG SERIAL: 1246759

## 2018-05-03 NOTE — Progress Notes (Signed)
Remote ICD transmission.   

## 2018-07-22 ENCOUNTER — Telehealth: Payer: Self-pay

## 2018-07-22 ENCOUNTER — Ambulatory Visit (INDEPENDENT_AMBULATORY_CARE_PROVIDER_SITE_OTHER): Payer: BLUE CROSS/BLUE SHIELD | Admitting: *Deleted

## 2018-07-22 ENCOUNTER — Other Ambulatory Visit: Payer: Self-pay

## 2018-07-22 DIAGNOSIS — I428 Other cardiomyopathies: Secondary | ICD-10-CM

## 2018-07-22 DIAGNOSIS — I429 Cardiomyopathy, unspecified: Secondary | ICD-10-CM

## 2018-07-22 DIAGNOSIS — R55 Syncope and collapse: Secondary | ICD-10-CM

## 2018-07-22 NOTE — Telephone Encounter (Signed)
Left message for patient to remind of missed remote transmission.  

## 2018-07-24 LAB — CUP PACEART REMOTE DEVICE CHECK
Date Time Interrogation Session: 20200513124614
Implantable Lead Implant Date: 20180102
Implantable Lead Location: 753860
Implantable Lead Model: 7122
Implantable Pulse Generator Implant Date: 20180102
Pulse Gen Serial Number: 1246759

## 2018-08-01 NOTE — Progress Notes (Signed)
Remote ICD transmission.   

## 2018-09-03 ENCOUNTER — Telehealth: Payer: Self-pay | Admitting: Internal Medicine

## 2018-09-03 ENCOUNTER — Encounter: Payer: BLUE CROSS/BLUE SHIELD | Admitting: Internal Medicine

## 2018-09-03 NOTE — Telephone Encounter (Signed)
Copied from [place] [id number]. Topic: Appointment Scheduling - Scheduling Inquiry for Clinic >> Sep 03, 2018 12:19 PM Erick Blinks wrote: Reason for CRM: Pt is requesting lab work. He has lost weight and wants to learn why. [phone] VM okay

## 2018-09-04 NOTE — Telephone Encounter (Signed)
Pt needs to be scheduled an appointment

## 2018-09-05 NOTE — Telephone Encounter (Signed)
Called pt LVM for him to  [place] and get scheduled for an appt for his weight loss FR

## 2018-09-18 ENCOUNTER — Encounter: Payer: BLUE CROSS/BLUE SHIELD | Admitting: Registered Nurse

## 2018-10-21 ENCOUNTER — Ambulatory Visit (INDEPENDENT_AMBULATORY_CARE_PROVIDER_SITE_OTHER): Payer: BLUE CROSS/BLUE SHIELD | Admitting: *Deleted

## 2018-10-21 DIAGNOSIS — R55 Syncope and collapse: Secondary | ICD-10-CM

## 2018-10-22 LAB — CUP PACEART REMOTE DEVICE CHECK
Battery Remaining Longevity: 79 mo
Battery Remaining Percentage: 77 %
Battery Voltage: 2.99 V
Brady Statistic RV Percent Paced: 1 %
Date Time Interrogation Session: 20200811043858
HighPow Impedance: 81 Ohm
HighPow Impedance: 81 Ohm
Implantable Lead Implant Date: 20180102
Implantable Lead Location: 753860
Implantable Lead Model: 7122
Implantable Pulse Generator Implant Date: 20180102
Lead Channel Impedance Value: 460 Ohm
Lead Channel Pacing Threshold Amplitude: 1 V
Lead Channel Pacing Threshold Pulse Width: 0.5 ms
Lead Channel Sensing Intrinsic Amplitude: 6.9 mV
Lead Channel Setting Pacing Amplitude: 2.5 V
Lead Channel Setting Pacing Pulse Width: 0.5 ms
Lead Channel Setting Sensing Sensitivity: 0.5 mV
Pulse Gen Serial Number: 1246759

## 2018-10-29 ENCOUNTER — Encounter: Payer: Self-pay | Admitting: Cardiology

## 2018-10-29 NOTE — Progress Notes (Signed)
Remote ICD transmission.   

## 2018-12-09 ENCOUNTER — Telehealth: Payer: Self-pay

## 2018-12-09 NOTE — Telephone Encounter (Signed)
Pt states that we are out of network with his insurance and [employer] no longer cover his home remote appointments. He do not want to send transmission anymore because he can not afford to pay $290 per appointment. I told him I will let Dr. Lovena Le know about this and a nurse will give him a call back. The pt states he got a bill for May 11 and now He getting a bill for August 10 remotes and he did not know about the remotes. I looked and we did send letters out to let the pt know about his appointments. I asked him did he receive the letters? He states he can not afford to pay the bill. I told him I will have someone from billing to give him a call to discuss the billing portion. I told the pt if he do not want to be billed for the remotes he will need to unplug his SJ monitor and put it up until he is ready to use the monitor.

## 2018-12-10 NOTE — Telephone Encounter (Signed)
I would encourage him to seek out a MD who is in his network. If he would like a referral to [place] or [place], let me know. GT

## 2018-12-11 ENCOUNTER — Telehealth: Payer: Self-pay

## 2018-12-11 NOTE — Telephone Encounter (Signed)
See duplicate phone note from 12/09/18. Patient aware, declined referral.

## 2018-12-11 NOTE — Telephone Encounter (Signed)
Pt then stated that he doesn't need a referral right now.

## 2018-12-11 NOTE — Telephone Encounter (Signed)
Spoke w/ pt and he stated that he isn't sure if he needs a referral for a new MD.

## 2018-12-11 NOTE — Telephone Encounter (Signed)
LMOVM for pt to call me back on my direct office number to let me know if he needs a referral for a MD in network with his insurance.

## 2019-04-14 ENCOUNTER — Encounter: Payer: Self-pay | Admitting: Internal Medicine

## 2019-04-14 ENCOUNTER — Other Ambulatory Visit: Payer: Self-pay

## 2019-04-14 ENCOUNTER — Ambulatory Visit: Payer: 59 | Admitting: Internal Medicine

## 2019-04-14 VITALS — BP 112/68 | Temp 98.1°F | Ht 67.0 in | Wt 168.0 lb

## 2019-04-14 DIAGNOSIS — Z0001 Encounter for general adult medical examination with abnormal findings: Secondary | ICD-10-CM

## 2019-04-14 DIAGNOSIS — E785 Hyperlipidemia, unspecified: Secondary | ICD-10-CM | POA: Diagnosis not present

## 2019-04-14 DIAGNOSIS — M5412 Radiculopathy, cervical region: Secondary | ICD-10-CM | POA: Insufficient documentation

## 2019-04-14 DIAGNOSIS — Z0000 Encounter for general adult medical examination without abnormal findings: Secondary | ICD-10-CM

## 2019-04-14 LAB — TSH: TSH: 1.16 u[IU]/mL (ref 0.35–4.50)

## 2019-04-14 LAB — BASIC METABOLIC PANEL
BUN: 16 mg/dL (ref 6–23)
CO2: 30 mEq/L (ref 19–32)
Calcium: 9.5 mg/dL (ref 8.4–10.5)
Chloride: 105 mEq/L (ref 96–112)
Creatinine, Ser: 0.81 mg/dL (ref 0.40–1.50)
GFR: 101.48 mL/min (ref >60.00)
Glucose, Bld: 100 mg/dL — ABNORMAL HIGH (ref 70–99)
Potassium: 3.9 mEq/L (ref 3.5–5.1)
Sodium: 140 mEq/L (ref 135–145)

## 2019-04-14 LAB — HEPATIC FUNCTION PANEL
ALT: 58 U/L — ABNORMAL HIGH (ref 0–53)
AST: 33 U/L (ref 0–37)
Albumin: 4.5 g/dL (ref 3.5–5.2)
Alkaline Phosphatase: 45 U/L (ref 39–117)
Bilirubin, Direct: 0.1 mg/dL (ref 0.0–0.3)
Total Bilirubin: 0.7 mg/dL (ref 0.2–1.2)
Total Protein: 6.8 g/dL (ref 6.0–8.3)

## 2019-04-14 LAB — LIPID PANEL
Cholesterol: 176 mg/dL (ref 0–200)
HDL: 45.6 mg/dL (ref >39.00)
LDL Cholesterol: 104 mg/dL — ABNORMAL HIGH (ref 0–99)
NonHDL: 130.44
Total CHOL/HDL Ratio: 4
Triglycerides: 130 mg/dL (ref 0.0–149.0)
VLDL: 26 mg/dL (ref 0.0–40.0)

## 2019-04-14 LAB — CBC WITH DIFFERENTIAL/PLATELET
Basophils Absolute: 0 10*3/uL (ref 0.0–0.1)
Basophils Relative: 0.4 % (ref 0.0–3.0)
Eosinophils Absolute: 0.1 10*3/uL (ref 0.0–0.7)
Eosinophils Relative: 1 % (ref 0.0–5.0)
HCT: 39.8 % (ref 39.0–52.0)
Hemoglobin: 13.4 g/dL (ref 13.0–17.0)
Lymphocytes Relative: 32.5 % (ref 12.0–46.0)
Lymphs Abs: 2.1 10*3/uL (ref 0.7–4.0)
MCHC: 33.6 g/dL (ref 30.0–36.0)
MCV: 95.3 fl (ref 78.0–100.0)
Monocytes Absolute: 0.5 10*3/uL (ref 0.1–1.0)
Monocytes Relative: 8.2 % (ref 3.0–12.0)
Neutro Abs: 3.7 10*3/uL (ref 1.4–7.7)
Neutrophils Relative %: 57.9 % (ref 43.0–77.0)
Platelets: 284 10*3/uL (ref 150.0–400.0)
RBC: 4.17 Mil/uL — ABNORMAL LOW (ref 4.22–5.81)
RDW: 13.1 % (ref 11.5–15.5)
WBC: 6.4 10*3/uL (ref 4.0–10.5)

## 2019-04-14 LAB — PSA: PSA: 0.56 ng/mL (ref 0.10–4.00)

## 2019-04-14 MED ORDER — PREDNISONE 10 MG PO TABS: ORAL_TABLET | ORAL | 0 refills | Status: DC

## 2019-04-14 MED ORDER — GABAPENTIN 100 MG PO CAPS: 100.0000 mg | ORAL_CAPSULE | Freq: Three times a day (TID) | ORAL | 3 refills | Status: DC

## 2019-04-14 MED ORDER — TRAMADOL HCL 50 MG PO TABS: 50.0000 mg | ORAL_TABLET | Freq: Four times a day (QID) | ORAL | 0 refills | Status: DC | PRN

## 2019-04-14 NOTE — Assessment & Plan Note (Signed)
Mod to severe persistent, for MRI, refer orthopedic, tramadol prn, prednisone asd, gabapentin tid,  to f/u any worsening symptoms or concerns  I spent 20 minutes preparing to see the patient by review of recent labs, imaging and procedures, obtaining and reviewing separately obtained history, communicating with the patient and family or caregiver, ordering medications, tests or procedures, and documenting clinical information in the EHR including the differential Dx, treatment, and any further evaluation and other management of right cervical radiculpathy, HLD

## 2019-04-14 NOTE — Assessment & Plan Note (Signed)

## 2019-04-14 NOTE — Patient Instructions (Addendum)
Please take all new medication as prescribed - the tramadol for pain, prednisone for ant-inflammatory, and gabapentin for nerve pain  Please continue all other medications as before, and refills have been done if requested.  Please have the pharmacy call with any other refills you may need.  Please continue your efforts at being more active, low cholesterol diet, and weight control.  You are otherwise up to date with prevention measures today.  Please keep your appointments with your specialists as you may have planned  You will be contacted regarding the referral for: MRI neck, and orthopedic  Please go to the LAB at the blood drawing area for the tests to be done  You will be contacted by phone if any changes need to be made immediately.  Otherwise, you will receive a letter about your results with an explanation, but please check with MyChart first.  Please make an Appointment to return for your 1 year visit, or sooner if needed

## 2019-04-14 NOTE — Progress Notes (Signed)
Subjective:    Patient ID: Chad [name], male    DOB: [birth year], 49 y.o.   MRN: [id number]  HPI  Here for wellness and f/u;  Overall doing ok;  Pt denies Chest pain, worsening SOB, DOE, wheezing, orthopnea, PND, worsening LE edema, palpitations, dizziness or syncope.  Pt denies neurological change such as new headache, facial or extremity weakness.  Pt denies polydipsia, polyuria, or low sugar symptoms. Pt states overall good compliance with treatment and medications, good tolerability, and has been trying to follow appropriate diet.  Pt denies worsening depressive symptoms, suicidal ideation or panic. No fever, night sweats, wt loss, loss of appetite, or other constitutional symptoms.  Pt states good ability with ADL's, has low fall risk, home safety reviewed and adequate, no other significant changes in hearing or vision, and only occasionally active with exercise. Also w/ c/o 2 mo persistent right cervical radiculopathyy symptoms mod to severe, constant, with mild RUE weakness, pain is 8/10. Past Medical History:  Diagnosis Date  . AICD (automatic cardioverter/defibrillator) present   . Brugada syndrome    s/p ICD 12/07  . DVT (deep venous thrombosis) (HCC)   . Dysrhythmia   . Echocardiogram    Echo 10/19: EF 60-65, no RWMA, mild LAE, mild TR, PASP 33  . History of Carotid Doppler ultrasound    Carotid US 10/19: normal  . Hyperlipidemia   . Presence of permanent cardiac pacemaker   . Syncope    s/p ICD 12/07   Past Surgical History:  Procedure Laterality Date  . BACK SURGERY    . CARDIAC DEFIBRILLATOR PLACEMENT  02/2006   St. Jude Atlas  . FINGER TENDON REPAIR Left    2nd digit  . ICD GENERATOR CHANGE  03/14/2016   ICD GENERATOR REMOVAL AND INSERTION OF NEW ICD, WITH LEAD  REMOVAL EXTRACTION Hattie Perch 03/14/2016  . ICD LEAD REMOVAL N/A 03/14/2016   Procedure: ICD GENERATOR REMOVAL AND INSERTION OF NEW ICD, WITH LEAD  REMOVAL EXTRACTION;  Surgeon: Marinus Maw, MD;  Location: [facility] OR;   Service: Cardiovascular;  Laterality: N/A;  . LUMBAR DISC SURGERY  1997  . TEE WITHOUT CARDIOVERSION N/A 03/14/2016   Procedure: TRANSESOPHAGEAL ECHOCARDIOGRAM (TEE);  Surgeon: Marinus Maw, MD;  Location: [facility] OR;  Service: Cardiovascular;  Laterality: N/A;  . UPPER EXTREMITY VENOGRAPHY Bilateral 05/12/2016   Procedure: Central Venography;  Surgeon: Sherren Kerns, MD;  Location: [facility] INVASIVE CV LAB;  Service: Cardiovascular;  Laterality: Bilateral;    reports that he quit smoking about 8 years ago. His smoking use included cigarettes. He has a 23.00 pack-year smoking history. He has never used smokeless tobacco. He reports current alcohol use of about 6.0 standard drinks of alcohol per week. He reports that he does not use drugs. family history includes Diabetes in his mother; Heart disease in his mother; Hyperlipidemia in his mother. No Known Allergies Current Outpatient Medications on File Prior to Visit  Medication Sig Dispense Refill  . aspirin EC 81 MG tablet Take 81 mg by mouth daily.    Marland Kitchen atorvastatin (LIPITOR) 20 MG tablet Take 1 tablet (20 mg total) by mouth daily. 30 tablet 5  . co-enzyme Q-10 50 MG capsule Take 50 mg by mouth daily.    . Ginkgo Biloba 120 MG TABS Take by mouth.    . meloxicam (MOBIC) 7.5 MG tablet Take 7.5 mg by mouth daily.    Marland Kitchen omega-3 acid ethyl esters (LOVAZA) 1 g capsule Take 1 g by mouth 4 (four) times  daily.    . valACYclovir (VALTREX) 1000 MG tablet Take 1 tablet (1,000 mg total) by mouth 2 (two) times daily as needed. 14 tablet 5   No current facility-administered medications on file prior to visit.   Review of Systems All otherwise neg per pt     Objective:   Physical Exam BP 112/68 (BP Location: Left Arm, Patient Position: Sitting, Cuff Size: Normal)   Temp 98.1 F (36.7 C) (Oral)   Ht 5\' 7"  (1.702 m)   Wt 168 lb (76.2 kg)   BMI 26.31 kg/m  VS noted,  Constitutional: Pt appears in NAD HENT: Head: NCAT.  Right Ear: External ear normal.  Left  Ear: External ear normal.  Eyes: . Pupils are equal, round, and reactive to light. Conjunctivae and EOM are normal Nose: without d/c or deformity Neck: Neck supple. Gross normal ROM Cardiovascular: Normal rate and regular rhythm.   Pulmonary/Chest: Effort normal and breath sounds without rales or wheezing.  Abd:  Soft, NT, ND, + BS, no organomegaly Neurological: Pt is alert. At baseline orientation, motor grossly intact Skin: Skin is warm. No rashes, other new lesions, no LE edema Psychiatric: Pt behavior is normal without agitation  All otherwise neg per pt  Lab Results  Component Value Date   WBC 6.4 04/14/2019   HGB 13.4 04/14/2019   HCT 39.8 04/14/2019   PLT 284.0 04/14/2019   GLUCOSE 100 (H) 04/14/2019   CHOL 176 04/14/2019   TRIG 130.0 04/14/2019   HDL 45.60 04/14/2019   LDLDIRECT 72.0 03/20/2018   LDLCALC 104 (H) 04/14/2019   ALT 58 (H) 04/14/2019   AST 33 04/14/2019   NA 140 04/14/2019   K 3.9 04/14/2019   CL 105 04/14/2019   CREATININE 0.81 04/14/2019   BUN 16 04/14/2019   CO2 30 04/14/2019   TSH 1.16 04/14/2019   PSA 0.56 04/14/2019   HGBA1C 6.0 (H) 12/20/2016       Assessment & Plan:

## 2019-04-14 NOTE — Assessment & Plan Note (Signed)
stable overall by history and exam, recent data reviewed with pt, and pt to continue medical treatment as before,  to f/u any worsening symptoms or concerns  

## 2019-04-15 LAB — URINALYSIS, ROUTINE W REFLEX MICROSCOPIC
Bilirubin Urine: NEGATIVE
Hgb urine dipstick: NEGATIVE
Ketones, ur: NEGATIVE
Leukocytes,Ua: NEGATIVE
Nitrite: NEGATIVE
RBC / HPF: NONE SEEN (ref 0–?)
Specific Gravity, Urine: 1.02 (ref 1.000–1.030)
Total Protein, Urine: NEGATIVE
Urine Glucose: NEGATIVE
Urobilinogen, UA: 1 (ref 0.0–1.0)
pH: 7 (ref 5.0–8.0)

## 2019-05-06 ENCOUNTER — Encounter: Payer: Self-pay | Admitting: Internal Medicine

## 2019-05-08 ENCOUNTER — Encounter (HOSPITAL_COMMUNITY): Payer: Self-pay | Admitting: Radiology

## 2019-05-08 ENCOUNTER — Other Ambulatory Visit (HOSPITAL_COMMUNITY): Payer: Self-pay | Admitting: Orthopedic Surgery

## 2019-05-08 ENCOUNTER — Other Ambulatory Visit: Payer: Self-pay | Admitting: Orthopedic Surgery

## 2019-05-08 DIAGNOSIS — M542 Cervicalgia: Secondary | ICD-10-CM

## 2019-05-08 NOTE — Progress Notes (Signed)
ST Jude Pacemaker is NOT MRI safe.

## 2019-05-29 ENCOUNTER — Ambulatory Visit: Payer: 59 | Attending: Internal Medicine

## 2019-05-29 DIAGNOSIS — Z23 Encounter for immunization: Secondary | ICD-10-CM

## 2019-05-29 NOTE — Progress Notes (Signed)
   Covid-19 Vaccination Clinic  Name:  Chad [name]    MRN: [id number] DOB: Aug 11, 1970  05/29/2019  [name] was observed post Covid-19 immunization for 15 minutes without incident. He was provided with Vaccine Information Sheet and instruction to access the V-Safe system.   [name] was instructed to call 911 with any severe reactions post vaccine: Marland Kitchen Difficulty breathing  . Swelling of face and throat  . A fast heartbeat  . A bad rash all over body  . Dizziness and weakness   Immunizations Administered    Name Date Dose VIS Date Route   Pfizer COVID-19 Vaccine 05/29/2019  8:31 AM 0.3 mL 02/21/2019 Intramuscular   Manufacturer: [employer], [employer]   Lot: [id number]   NDC: [address]-6

## 2019-06-25 ENCOUNTER — Ambulatory Visit: Payer: 59 | Attending: Internal Medicine

## 2019-06-25 DIAGNOSIS — Z23 Encounter for immunization: Secondary | ICD-10-CM

## 2019-06-25 NOTE — Progress Notes (Signed)
   Covid-19 Vaccination Clinic  Name:  Draycen Leichter    MRN: [id number] DOB: 06/27/1970  06/25/2019  Mr. Klatt was observed post Covid-19 immunization for 15 minutes without incident. He was provided with Vaccine Information Sheet and instruction to access the V-Safe system.   Mr. Enberg was instructed to call 911 with any severe reactions post vaccine: Marland Kitchen Difficulty breathing  . Swelling of face and throat  . A fast heartbeat  . A bad rash all over body  . Dizziness and weakness   Immunizations Administered    Name Date Dose VIS Date Route   Pfizer COVID-19 Vaccine 06/25/2019  9:02 AM 0.3 mL 02/21/2019 Intramuscular   Manufacturer: [employer], [employer]   Lot: [id number]   NDC: [address]-2

## 2019-11-24 NOTE — Progress Notes (Signed)
Electrophysiology Office Note Date: 11/25/2019  ID:  Dara, Camargo [birth year], MRN [id number]  PCP: Corwin Levins, MD Primary Cardiologist: Lewayne Bunting, MD Electrophysiologist: Lewayne Bunting, MD   CC: Routine ICD follow-up  Chad [name] is a 49 y.o. male seen today for Lewayne Bunting, MD for routine electrophysiology followup.  Since last being seen in our clinic the patient reports doing well. He has very occasional palpitations. Most recently last week, but was associated with "a big coffee". Denies chest pain. He did have mild SOB in the setting of his palpitations last week. He dyspnea on exertion, PND, orthopnea, nausea, vomiting, dizziness, syncope, edema, weight gain, or early satiety. He has not had ICD shocks.   Device History: St. Jude Single Chamber ICD implanted 03/2016 for Brugada Syndrome History of appropriate therapy: No History of AAD therapy: No   Past Medical History:  Diagnosis Date  . AICD (automatic cardioverter/defibrillator) present   . Brugada syndrome    s/p ICD 12/07  . DVT (deep venous thrombosis) (HCC)   . Dysrhythmia   . Echocardiogram    Echo 10/19: EF 60-65, no RWMA, mild LAE, mild TR, PASP 33  . History of Carotid Doppler ultrasound    Carotid US 10/19: normal  . Hyperlipidemia   . Presence of permanent cardiac pacemaker   . Syncope    s/p ICD 12/07   Past Surgical History:  Procedure Laterality Date  . BACK SURGERY    . CARDIAC DEFIBRILLATOR PLACEMENT  02/2006   St. Jude Atlas  . FINGER TENDON REPAIR Left    2nd digit  . ICD GENERATOR CHANGE  03/14/2016   ICD GENERATOR REMOVAL AND INSERTION OF NEW ICD, WITH LEAD  REMOVAL EXTRACTION Chad [name] 03/14/2016  . ICD LEAD REMOVAL N/A 03/14/2016   Procedure: ICD GENERATOR REMOVAL AND INSERTION OF NEW ICD, WITH LEAD  REMOVAL EXTRACTION;  Surgeon: Marinus Maw, MD;  Location: [facility] OR;  Service: Cardiovascular;  Laterality: N/A;  . LUMBAR DISC SURGERY  1997  . TEE WITHOUT CARDIOVERSION N/A 03/14/2016    Procedure: TRANSESOPHAGEAL ECHOCARDIOGRAM (TEE);  Surgeon: Marinus Maw, MD;  Location: [facility] OR;  Service: Cardiovascular;  Laterality: N/A;  . UPPER EXTREMITY VENOGRAPHY Bilateral 05/12/2016   Procedure: Central Venography;  Surgeon: Sherren Kerns, MD;  Location: [facility] INVASIVE CV LAB;  Service: Cardiovascular;  Laterality: Bilateral;    Current Outpatient Medications  Medication Sig Dispense Refill  . aspirin EC 81 MG tablet Take 81 mg by mouth daily.    Marland Kitchen atorvastatin (LIPITOR) 20 MG tablet Take 1 tablet (20 mg total) by mouth daily. 30 tablet 5  . co-enzyme Q-10 50 MG capsule Take 50 mg by mouth daily.    Marland Kitchen gabapentin (NEURONTIN) 100 MG capsule Take 1 capsule (100 mg total) by mouth 3 (three) times daily. 90 capsule 3  . Ginkgo Biloba 120 MG TABS Take by mouth.    . meloxicam (MOBIC) 7.5 MG tablet Take 7.5 mg by mouth daily.    Marland Kitchen omega-3 acid ethyl esters (LOVAZA) 1 g capsule Take 2 capsules (2 g total) by mouth in the morning and at bedtime. 120 capsule 11  . predniSONE (DELTASONE) 10 MG tablet 2 tabs by mouth per day for 5 days 18 tablet 0  . traMADol (ULTRAM) 50 MG tablet Take 1 tablet (50 mg total) by mouth every 6 (six) hours as needed. 30 tablet 0  . valACYclovir (VALTREX) 1000 MG tablet Take 1 tablet (1,000 mg total) by mouth 2 (two)  times daily as needed. 14 tablet 5  . metoprolol tartrate (LOPRESSOR) 25 MG tablet Take 0.5 tablets (12.5 mg total) by mouth as needed (for increased heart rate and palpitations). 30 tablet 3   No current facility-administered medications for this visit.    Allergies:   Patient has no known allergies.   Social History: Social History   Socioeconomic History  . Marital status: Divorced    Spouse name: Not on file  . Number of children: 2  . Years of education: GED  . Highest education level: Not on file  Occupational History    Employer: [employer]    Comment: [employer]  Tobacco Use  . Smoking status: Former Smoker    Packs/day: 1.00     Years: 23.00    Pack years: 23.00    Types: Cigarettes    Quit date: 03/14/2011    Years since quitting: 8.7  . Smokeless tobacco: Never Used  Vaping Use  . Vaping Use: Never used  Substance and Sexual Activity  . Alcohol use: Yes    Alcohol/week: 6.0 standard drinks    Types: 6 Cans of beer per week  . Drug use: No  . Sexual activity: Never  Other Topics Concern  . Not on file  Social History Narrative   Married   Social Determinants of Health   Financial Resource Strain:   . Difficulty of Paying Living Expenses: Not on file  Food Insecurity:   . Worried About Programme researcher, broadcasting/film/video in the Last Year: Not on file  . Ran Out of Food in the Last Year: Not on file  Transportation Needs:   . Lack of Transportation (Medical): Not on file  . Lack of Transportation (Non-Medical): Not on file  Physical Activity:   . Days of Exercise per Week: Not on file  . Minutes of Exercise per Session: Not on file  Stress:   . Feeling of Stress : Not on file  Social Connections:   . Frequency of Communication with Friends and Family: Not on file  . Frequency of Social Gatherings with Friends and Family: Not on file  . Attends Religious Services: Not on file  . Active Member of Clubs or Organizations: Not on file  . Attends Banker Meetings: Not on file  . Marital Status: Not on file  Intimate Partner Violence:   . Fear of Current or Ex-Partner: Not on file  . Emotionally Abused: Not on file  . Physically Abused: Not on file  . Sexually Abused: Not on file    Family History: Family History  Problem Relation Age of Onset  . Hyperlipidemia Mother   . Heart disease Mother   . Diabetes Mother     Review of Systems: All other systems reviewed and are otherwise negative except as noted above.   Physical Exam: Vitals:   11/25/19 1017  BP: 112/68  Pulse: 60  SpO2: 96%  Weight: 166 lb (75.3 kg)  Height: 5\' 7"  (1.702 m)     GEN- The patient is well appearing, alert and  oriented x 3 today.   HEENT: normocephalic, atraumatic; sclera clear, conjunctiva pink; hearing intact; oropharynx clear; neck supple, no JVP Lymph- no cervical lymphadenopathy Lungs- Clear to ausculation bilaterally, normal work of breathing.  No wheezes, rales, rhonchi Heart- Regular rate and rhythm, no murmurs, rubs or gallops, PMI not laterally displaced GI- soft, non-tender, non-distended, bowel sounds present, no hepatosplenomegaly Extremities- no clubbing or cyanosis. No edema; DP/PT/radial pulses 2+ bilaterally  MS- no significant deformity or atrophy Skin- warm and dry, no rash or lesion; ICD pocket well healed Psych- euthymic mood, full affect Neuro- strength and sensation are intact  ICD interrogation- reviewed in detail today,  See PACEART report  EKG:  EKG is ordered today. The ekg ordered today shows NSR at 60 bpm, QRS 140 ms, PR interval 174 ms  Recent Labs: 04/14/2019: ALT 58; BUN 16; Creatinine, Ser 0.81; Hemoglobin 13.4; Platelets 284.0; Potassium 3.9; Sodium 140; TSH 1.16   Wt Readings from Last 3 Encounters:  11/25/19 166 lb (75.3 kg)  04/14/19 168 lb (76.2 kg)  04/08/18 170 lb (77.1 kg)     Other studies Reviewed: Additional studies/ records that were reviewed today include: Previous EP office notes, previous remotes   Assessment and Plan:  1.  Brugada Syndrome s/p St. Jude single chamber ICD  euvolemic today Stable on an appropriate medical regimen Normal ICD function See Pace Art report No changes today No further VT   2. Arm swelling/occluded SV Resolved.   3. PSVT vs Sinus tach Asymptomatic apart from an episode last week that was associated with caffeine use.  Pt declines daily BB, but would like to have lopressor 12.5 mg available to him as needed. Will send prescription today.   Current medicines are reviewed at length with the patient today.   The patient does not have concerns regarding his medicines.  The following changes were made today:   none  Labs/ tests ordered today include:  Orders Placed This Encounter  Procedures  . EKG 12-Lead   Disposition:   Follow up with Dr. Ladona Ridgel  12 months  Signed, Graciella Freer, PA-C  11/25/2019 12:43 PM  [facility] [address] [place] [address] [phone] (office) [phone] (fax)

## 2019-11-25 ENCOUNTER — Other Ambulatory Visit: Payer: Self-pay

## 2019-11-25 ENCOUNTER — Ambulatory Visit (INDEPENDENT_AMBULATORY_CARE_PROVIDER_SITE_OTHER): Payer: 59 | Admitting: Student

## 2019-11-25 ENCOUNTER — Encounter: Payer: Self-pay | Admitting: Student

## 2019-11-25 VITALS — BP 112/68 | HR 60 | Ht 67.0 in | Wt 166.0 lb

## 2019-11-25 DIAGNOSIS — I498 Other specified cardiac arrhythmias: Secondary | ICD-10-CM | POA: Diagnosis not present

## 2019-11-25 DIAGNOSIS — Z95810 Presence of automatic (implantable) cardiac defibrillator: Secondary | ICD-10-CM | POA: Diagnosis not present

## 2019-11-25 DIAGNOSIS — I471 Supraventricular tachycardia: Secondary | ICD-10-CM

## 2019-11-25 LAB — CUP PACEART INCLINIC DEVICE CHECK
Battery Remaining Longevity: 72 mo
Brady Statistic RV Percent Paced: 0 %
Date Time Interrogation Session: 20210914123906
HighPow Impedance: 82.125
Implantable Lead Implant Date: 20180102
Implantable Lead Location: 753860
Implantable Lead Model: 7122
Implantable Pulse Generator Implant Date: 20180102
Lead Channel Impedance Value: 512.5 Ohm
Lead Channel Pacing Threshold Amplitude: 1.25 V
Lead Channel Pacing Threshold Amplitude: 1.25 V
Lead Channel Pacing Threshold Pulse Width: 0.6 ms
Lead Channel Pacing Threshold Pulse Width: 0.6 ms
Lead Channel Sensing Intrinsic Amplitude: 9.9 mV
Lead Channel Setting Pacing Amplitude: 2.5 V
Lead Channel Setting Pacing Pulse Width: 0.6 ms
Lead Channel Setting Sensing Sensitivity: 0.5 mV
Pulse Gen Serial Number: 1246759

## 2019-11-25 MED ORDER — METOPROLOL TARTRATE 25 MG PO TABS: 12.5000 mg | ORAL_TABLET | ORAL | 3 refills | Status: DC | PRN

## 2019-11-25 MED ORDER — OMEGA-3-ACID ETHYL ESTERS 1 G PO CAPS: 2.0000 g | ORAL_CAPSULE | Freq: Two times a day (BID) | ORAL | 11 refills | Status: DC

## 2019-11-25 NOTE — Patient Instructions (Signed)
Medication Instructions:  Your physician has recommended you make the following change in your medication:  -- You may take lopressor 12.5 mg as needed for palpitations -- RX Sent to pharmacy  *If you need a refill on your cardiac medications before your next appointment, please call your pharmacy*  Follow-Up: At [facility], you and your health needs are our priority.  As part of our continuing mission to provide you with exceptional heart care, we have created designated Provider Care Teams.  These Care Teams include your primary Cardiologist (physician) and Advanced Practice Providers (APPs -  Physician Assistants and Nurse Practitioners) who all work together to provide you with the care you need, when you need it.  We recommend signing up for the patient portal called "MyChart".  Sign up information is provided on this After Visit Summary.  MyChart is used to connect with patients for Virtual Visits (Telemedicine).  Patients are able to view lab/test results, encounter notes, upcoming appointments, etc.  Non-urgent messages can be sent to your provider as well.   To learn more about what you can do with MyChart, go to [email or web address].    Your next appointment:   Your physician wants you to follow-up in: 1 YEAR with Dr. Ladona Ridgel. You will receive a reminder letter in the mail two months in advance. If you don't receive a letter, please call our office to schedule the follow-up appointment.  Remote monitoring is used to monitor your ICD from home. This monitoring reduces the number of office visits required to check your device to one time per year. It allows [place] to keep an eye on the functioning of your device to ensure it is working properly. You are scheduled for a device check from home on 12/02/19. You may send your transmission at any time that day. If you have a wireless device, the transmission will be sent automatically. After your physician reviews your transmission, you will  receive a postcard with your next transmission date.  The format for your next appointment:   In Person with Lewayne Bunting, MD

## 2019-12-02 ENCOUNTER — Ambulatory Visit (INDEPENDENT_AMBULATORY_CARE_PROVIDER_SITE_OTHER): Payer: 59 | Admitting: *Deleted

## 2019-12-02 DIAGNOSIS — R55 Syncope and collapse: Secondary | ICD-10-CM | POA: Diagnosis not present

## 2019-12-03 LAB — CUP PACEART REMOTE DEVICE CHECK
Battery Remaining Longevity: 71 mo
Battery Remaining Percentage: 70 %
Battery Voltage: 2.98 V
Brady Statistic RV Percent Paced: 1 %
Date Time Interrogation Session: 20210922013247
HighPow Impedance: 82 Ohm
HighPow Impedance: 82 Ohm
Implantable Lead Implant Date: 20180102
Implantable Lead Location: 753860
Implantable Lead Model: 7122
Implantable Pulse Generator Implant Date: 20180102
Lead Channel Impedance Value: 480 Ohm
Lead Channel Pacing Threshold Amplitude: 1.25 V
Lead Channel Pacing Threshold Pulse Width: 0.6 ms
Lead Channel Sensing Intrinsic Amplitude: 8.2 mV
Lead Channel Setting Pacing Amplitude: 2.5 V
Lead Channel Setting Pacing Pulse Width: 0.6 ms
Lead Channel Setting Sensing Sensitivity: 0.5 mV
Pulse Gen Serial Number: 1246759

## 2019-12-03 NOTE — Progress Notes (Signed)
Remote ICD transmission.   

## 2020-02-11 ENCOUNTER — Ambulatory Visit: Payer: 59 | Admitting: Internal Medicine

## 2020-02-11 ENCOUNTER — Encounter: Payer: Self-pay | Admitting: Internal Medicine

## 2020-02-11 ENCOUNTER — Other Ambulatory Visit: Payer: Self-pay

## 2020-02-11 VITALS — BP 110/80 | HR 63 | Temp 98.2°F | Ht 67.0 in | Wt 173.0 lb

## 2020-02-11 DIAGNOSIS — W540XXA Bitten by dog, initial encounter: Secondary | ICD-10-CM

## 2020-02-11 DIAGNOSIS — S61051A Open bite of right thumb without damage to nail, initial encounter: Secondary | ICD-10-CM

## 2020-02-11 DIAGNOSIS — E7849 Other hyperlipidemia: Secondary | ICD-10-CM

## 2020-02-11 DIAGNOSIS — F419 Anxiety disorder, unspecified: Secondary | ICD-10-CM | POA: Diagnosis not present

## 2020-02-11 DIAGNOSIS — R21 Rash and other nonspecific skin eruption: Secondary | ICD-10-CM

## 2020-02-11 LAB — BASIC METABOLIC PANEL
BUN: 14 mg/dL (ref 6–23)
CO2: 29 mEq/L (ref 19–32)
Calcium: 9.3 mg/dL (ref 8.4–10.5)
Chloride: 102 mEq/L (ref 96–112)
Creatinine, Ser: 0.73 mg/dL (ref 0.40–1.50)
GFR: 106.95 mL/min (ref >60.00)
Glucose, Bld: 93 mg/dL (ref 70–99)
Potassium: 3.9 mEq/L (ref 3.5–5.1)
Sodium: 137 mEq/L (ref 135–145)

## 2020-02-11 LAB — CBC WITH DIFFERENTIAL/PLATELET
Basophils Absolute: 0 10*3/uL (ref 0.0–0.1)
Basophils Relative: 0.5 % (ref 0.0–3.0)
Eosinophils Absolute: 0.1 10*3/uL (ref 0.0–0.7)
Eosinophils Relative: 1.9 % (ref 0.0–5.0)
HCT: 41.9 % (ref 39.0–52.0)
Hemoglobin: 14.2 g/dL (ref 13.0–17.0)
Lymphocytes Relative: 34.8 % (ref 12.0–46.0)
Lymphs Abs: 2.7 10*3/uL (ref 0.7–4.0)
MCHC: 33.8 g/dL (ref 30.0–36.0)
MCV: 93.7 fl (ref 78.0–100.0)
Monocytes Absolute: 0.4 10*3/uL (ref 0.1–1.0)
Monocytes Relative: 5.5 % (ref 3.0–12.0)
Neutro Abs: 4.5 10*3/uL (ref 1.4–7.7)
Neutrophils Relative %: 57.3 % (ref 43.0–77.0)
Platelets: 292 10*3/uL (ref 150.0–400.0)
RBC: 4.47 Mil/uL (ref 4.22–5.81)
RDW: 13 % (ref 11.5–15.5)
WBC: 7.8 10*3/uL (ref 4.0–10.5)

## 2020-02-11 LAB — LIPID PANEL
Cholesterol: 159 mg/dL (ref 0–200)
HDL: 48.5 mg/dL (ref >39.00)
LDL Cholesterol: 75 mg/dL (ref 0–99)
NonHDL: 110.23
Total CHOL/HDL Ratio: 3
Triglycerides: 176 mg/dL — ABNORMAL HIGH (ref 0.0–149.0)
VLDL: 35.2 mg/dL (ref 0.0–40.0)

## 2020-02-11 LAB — HEPATIC FUNCTION PANEL
ALT: 52 U/L (ref 0–53)
AST: 31 U/L (ref 0–37)
Albumin: 4.4 g/dL (ref 3.5–5.2)
Alkaline Phosphatase: 44 U/L (ref 39–117)
Bilirubin, Direct: 0.1 mg/dL (ref 0.0–0.3)
Total Bilirubin: 0.5 mg/dL (ref 0.2–1.2)
Total Protein: 7.1 g/dL (ref 6.0–8.3)

## 2020-02-11 MED ORDER — TRIAMCINOLONE ACETONIDE 0.1 % EX CREA: 1.0000 "application " | TOPICAL_CREAM | Freq: Two times a day (BID) | CUTANEOUS | 0 refills | Status: AC

## 2020-02-11 NOTE — Patient Instructions (Signed)
Please take all new medication as prescribed - the cream  Please continue all other medications as before, and refills have been done if requested.  Please have the pharmacy call with any other refills you may need.  Please continue your efforts at being more active, low cholesterol diet, and weight control.  Please keep your appointments with your specialists as you may have planned  Please go to the LAB at the blood drawing area for the tests to be done  You will be contacted by phone if any changes need to be made immediately.  Otherwise, you will receive a letter about your results with an explanation, but please check with MyChart first.  Please remember to sign up for MyChart if you have not done so, as this will be important to you in the future with finding out test results, communicating by private email, and scheduling acute appointments online when needed.  Please make an Appointment to return in 3 months

## 2020-02-11 NOTE — Progress Notes (Signed)
Subjective:    Patient ID: Chad [name], male    DOB: 1970/08/24, 49 y.o.   MRN: [id number]  HPI  Here with c/o 6 days ago dog bite with one puncture wound tooth mark to the right thumb post medial aspect, but now appears to be near healed without red, tender swelling or drainage.  Wants to know if need Tdap.  Last tdap 2019.  Also with some irriation and thin skin abrasion kind of lesion to right mid penile shaft, irritating and discomfort, worse when being intimate, ongoing for over 1 wk.  Tolerating all meds.  Pt denies chest pain, increased sob or doe, wheezing, orthopnea, PND, increased LE swelling, palpitations, dizziness or syncope.  Pt denies new neurological symptoms such as new headache, or facial or extremity weakness or numbness   Pt denies polydipsia, polyuria.  Denies worsening depressive symptoms, suicidal ideation, or pani Past Medical History:  Diagnosis Date  . AICD (automatic cardioverter/defibrillator) present   . Brugada syndrome    s/p ICD 12/07  . DVT (deep venous thrombosis) (HCC)   . Dysrhythmia   . Echocardiogram    Echo 10/19: EF 60-65, no RWMA, mild LAE, mild TR, PASP 33  . History of Carotid Doppler ultrasound    Carotid US 10/19: normal  . Hyperlipidemia   . Presence of permanent cardiac pacemaker   . Syncope    s/p ICD 12/07   Past Surgical History:  Procedure Laterality Date  . BACK SURGERY    . CARDIAC DEFIBRILLATOR PLACEMENT  02/2006   St. Jude Atlas  . FINGER TENDON REPAIR Left    2nd digit  . ICD GENERATOR CHANGE  03/14/2016   ICD GENERATOR REMOVAL AND INSERTION OF NEW ICD, WITH LEAD  REMOVAL EXTRACTION Hattie Perch 03/14/2016  . ICD LEAD REMOVAL N/A 03/14/2016   Procedure: ICD GENERATOR REMOVAL AND INSERTION OF NEW ICD, WITH LEAD  REMOVAL EXTRACTION;  Surgeon: Marinus Maw, MD;  Location: [facility] OR;  Service: Cardiovascular;  Laterality: N/A;  . LUMBAR DISC SURGERY  1997  . TEE WITHOUT CARDIOVERSION N/A 03/14/2016   Procedure: TRANSESOPHAGEAL ECHOCARDIOGRAM  (TEE);  Surgeon: Marinus Maw, MD;  Location: [facility] OR;  Service: Cardiovascular;  Laterality: N/A;  . UPPER EXTREMITY VENOGRAPHY Bilateral 05/12/2016   Procedure: Central Venography;  Surgeon: Sherren Kerns, MD;  Location: [facility] INVASIVE CV LAB;  Service: Cardiovascular;  Laterality: Bilateral;    reports that he quit smoking about 8 years ago. His smoking use included cigarettes. He has a 23.00 pack-year smoking history. He has never used smokeless tobacco. He reports current alcohol use of about 6.0 standard drinks of alcohol per week. He reports that he does not use drugs. family history includes Diabetes in his mother; Heart disease in his mother; Hyperlipidemia in his mother. No Known Allergies Current Outpatient Medications on File Prior to Visit  Medication Sig Dispense Refill  . aspirin EC 81 MG tablet Take 81 mg by mouth daily.    Marland Kitchen atorvastatin (LIPITOR) 20 MG tablet Take 1 tablet (20 mg total) by mouth daily. 30 tablet 5  . co-enzyme Q-10 50 MG capsule Take 50 mg by mouth daily.    Marland Kitchen gabapentin (NEURONTIN) 100 MG capsule Take 1 capsule (100 mg total) by mouth 3 (three) times daily. 90 capsule 3  . Ginkgo Biloba 120 MG TABS Take by mouth.    . meloxicam (MOBIC) 7.5 MG tablet Take 7.5 mg by mouth daily.    . metoprolol tartrate (LOPRESSOR) 25 MG tablet Take  0.5 tablets (12.5 mg total) by mouth as needed (for increased heart rate and palpitations). 30 tablet 3  . omega-3 acid ethyl esters (LOVAZA) 1 g capsule Take 2 capsules (2 g total) by mouth in the morning and at bedtime. 120 capsule 11  . Omega-3 Fatty Acids (FISH OIL) 1000 MG CAPS Take by mouth.    . predniSONE (DELTASONE) 10 MG tablet 2 tabs by mouth per day for 5 days 18 tablet 0  . traMADol (ULTRAM) 50 MG tablet Take 1 tablet (50 mg total) by mouth every 6 (six) hours as needed. 30 tablet 0  . valACYclovir (VALTREX) 1000 MG tablet Take 1 tablet (1,000 mg total) by mouth 2 (two) times daily as needed. 14 tablet 5   No current  facility-administered medications on file prior to visit.   Review of Systems All otherwise neg per pt     Objective:   Physical Exam BP 110/80 (BP Location: Left Arm, Patient Position: Sitting, Cuff Size: Large)   Pulse 63   Temp 98.2 F (36.8 C) (Oral)   Ht 5\' 7"  (1.702 m)   Wt 173 lb (78.5 kg)   SpO2 98%   BMI 27.10 kg/m  VS noted,  Constitutional: Pt appears in NAD HENT: Head: NCAT.  Right Ear: External ear normal.  Left Ear: External ear normal.  Eyes: . Pupils are equal, round, and reactive to light. Conjunctivae and EOM are normal Nose: without d/c or deformity Neck: Neck supple. Gross normal ROM Cardiovascular: Normal rate and regular rhythm.   Pulmonary/Chest: Effort normal and breath sounds without rales or wheezing. right thumb with near healed post medial puncture wound just prox to the nail bed without s/s cellulitis Neurological: Pt is alert. At baseline orientation, motor grossly intact Skin: Skin is warm. No rashes, other new lesions, no LE edema Psychiatric: Pt behavior is normal without agitation  All otherwise neg per pt  Lab Results  Component Value Date   WBC 7.8 02/11/2020   HGB 14.2 02/11/2020   HCT 41.9 02/11/2020   PLT 292.0 02/11/2020   GLUCOSE 93 02/11/2020   CHOL 159 02/11/2020   TRIG 176.0 (H) 02/11/2020   HDL 48.50 02/11/2020   LDLDIRECT 72.0 03/20/2018   LDLCALC 75 02/11/2020   ALT 52 02/11/2020   AST 31 02/11/2020   NA 137 02/11/2020   K 3.9 02/11/2020   CL 102 02/11/2020   CREATININE 0.73 02/11/2020   BUN 14 02/11/2020   CO2 29 02/11/2020   TSH 1.16 04/14/2019   PSA 0.56 04/14/2019   HGBA1C 6.0 (H) 12/20/2016        Assessment & Plan:

## 2020-02-15 ENCOUNTER — Encounter: Payer: Self-pay | Admitting: Internal Medicine

## 2020-02-15 DIAGNOSIS — R21 Rash and other nonspecific skin eruption: Secondary | ICD-10-CM | POA: Insufficient documentation

## 2020-02-15 NOTE — Assessment & Plan Note (Signed)
stable overall by history and exam, recent data reviewed with pt, and pt to continue medical treatment as before,  to f/u any worsening symptoms or concerns, for f/u lab 

## 2020-02-15 NOTE — Assessment & Plan Note (Addendum)
Minor bite puncture wound now healed, no signs of infection, last tdap 2019, ok to follow, reassured  I spent 31 minutes in preparing to see the patient by review of recent labs, imaging and procedures, obtaining and reviewing separately obtained history, communicating with the patient and family or caregiver, ordering medications, tests or procedures, and documenting clinical information in the EHR including the differential Dx, treatment, and any further evaluation and other management of dog bite, hld, rash, anxiety

## 2020-02-15 NOTE — Assessment & Plan Note (Signed)
Ok for triam cr prn,  to f/u any worsening symptoms or concerns 

## 2020-02-15 NOTE — Assessment & Plan Note (Signed)
stable overall by history and exam, recent data reviewed with pt, and pt to continue medical treatment as before,  to f/u any worsening symptoms or concerns, pt reassured

## 2020-03-03 ENCOUNTER — Ambulatory Visit (INDEPENDENT_AMBULATORY_CARE_PROVIDER_SITE_OTHER): Payer: 59

## 2020-03-03 DIAGNOSIS — R55 Syncope and collapse: Secondary | ICD-10-CM

## 2020-03-07 LAB — CUP PACEART REMOTE DEVICE CHECK
Battery Remaining Longevity: 70 mo
Battery Remaining Percentage: 68 %
Battery Voltage: 2.96 V
Brady Statistic RV Percent Paced: 1 %
Date Time Interrogation Session: 20211224025351
HighPow Impedance: 86 Ohm
HighPow Impedance: 86 Ohm
Implantable Lead Implant Date: 20180102
Implantable Lead Location: 753860
Implantable Lead Model: 7122
Implantable Pulse Generator Implant Date: 20180102
Lead Channel Impedance Value: 540 Ohm
Lead Channel Pacing Threshold Amplitude: 1.25 V
Lead Channel Pacing Threshold Pulse Width: 0.6 ms
Lead Channel Sensing Intrinsic Amplitude: 10.3 mV
Lead Channel Setting Pacing Amplitude: 2.5 V
Lead Channel Setting Pacing Pulse Width: 0.6 ms
Lead Channel Setting Sensing Sensitivity: 0.5 mV
Pulse Gen Serial Number: 1246759

## 2020-03-17 NOTE — Progress Notes (Signed)
Remote ICD transmission.   

## 2020-05-02 ENCOUNTER — Other Ambulatory Visit: Payer: Self-pay | Admitting: Student

## 2020-05-12 ENCOUNTER — Ambulatory Visit: Payer: 59 | Admitting: Internal Medicine

## 2020-05-26 ENCOUNTER — Other Ambulatory Visit: Payer: Self-pay

## 2020-05-26 ENCOUNTER — Ambulatory Visit: Payer: 59 | Admitting: Internal Medicine

## 2020-06-02 ENCOUNTER — Ambulatory Visit (INDEPENDENT_AMBULATORY_CARE_PROVIDER_SITE_OTHER): Payer: 59

## 2020-06-02 DIAGNOSIS — R55 Syncope and collapse: Secondary | ICD-10-CM | POA: Diagnosis not present

## 2020-06-02 DIAGNOSIS — I428 Other cardiomyopathies: Secondary | ICD-10-CM

## 2020-06-02 DIAGNOSIS — I429 Cardiomyopathy, unspecified: Secondary | ICD-10-CM

## 2020-06-03 ENCOUNTER — Ambulatory Visit: Payer: 59 | Admitting: Internal Medicine

## 2020-06-05 LAB — CUP PACEART REMOTE DEVICE CHECK
Battery Remaining Longevity: 67 mo
Battery Remaining Percentage: 66 %
Battery Voltage: 2.96 V
Brady Statistic RV Percent Paced: 1 %
Date Time Interrogation Session: 20220326010007
HighPow Impedance: 87 Ohm
HighPow Impedance: 87 Ohm
Implantable Lead Implant Date: 20180102
Implantable Lead Location: 753860
Implantable Lead Model: 7122
Implantable Pulse Generator Implant Date: 20180102
Lead Channel Impedance Value: 480 Ohm
Lead Channel Pacing Threshold Amplitude: 1.25 V
Lead Channel Pacing Threshold Pulse Width: 0.6 ms
Lead Channel Sensing Intrinsic Amplitude: 9.3 mV
Lead Channel Setting Pacing Amplitude: 2.5 V
Lead Channel Setting Pacing Pulse Width: 0.6 ms
Lead Channel Setting Sensing Sensitivity: 0.5 mV
Pulse Gen Serial Number: 1246759

## 2020-06-09 ENCOUNTER — Ambulatory Visit: Payer: 59 | Admitting: Internal Medicine

## 2020-06-11 NOTE — Progress Notes (Signed)
Remote ICD transmission.   

## 2020-06-16 ENCOUNTER — Encounter: Payer: Self-pay | Admitting: Internal Medicine

## 2020-06-16 ENCOUNTER — Other Ambulatory Visit: Payer: Self-pay

## 2020-06-16 ENCOUNTER — Ambulatory Visit (INDEPENDENT_AMBULATORY_CARE_PROVIDER_SITE_OTHER): Payer: 59 | Admitting: Internal Medicine

## 2020-06-16 VITALS — BP 122/84 | HR 61 | Ht 67.0 in | Wt 170.0 lb

## 2020-06-16 DIAGNOSIS — Z1159 Encounter for screening for other viral diseases: Secondary | ICD-10-CM | POA: Diagnosis not present

## 2020-06-16 DIAGNOSIS — Z125 Encounter for screening for malignant neoplasm of prostate: Secondary | ICD-10-CM

## 2020-06-16 DIAGNOSIS — E538 Deficiency of other specified B group vitamins: Secondary | ICD-10-CM | POA: Diagnosis not present

## 2020-06-16 DIAGNOSIS — E782 Mixed hyperlipidemia: Secondary | ICD-10-CM

## 2020-06-16 DIAGNOSIS — R739 Hyperglycemia, unspecified: Secondary | ICD-10-CM

## 2020-06-16 DIAGNOSIS — Z7289 Other problems related to lifestyle: Secondary | ICD-10-CM | POA: Diagnosis not present

## 2020-06-16 DIAGNOSIS — F1010 Alcohol abuse, uncomplicated: Secondary | ICD-10-CM | POA: Insufficient documentation

## 2020-06-16 DIAGNOSIS — Z789 Other specified health status: Secondary | ICD-10-CM | POA: Insufficient documentation

## 2020-06-16 DIAGNOSIS — Z0001 Encounter for general adult medical examination with abnormal findings: Secondary | ICD-10-CM

## 2020-06-16 DIAGNOSIS — F1090 Alcohol use, unspecified, uncomplicated: Secondary | ICD-10-CM

## 2020-06-16 DIAGNOSIS — E559 Vitamin D deficiency, unspecified: Secondary | ICD-10-CM

## 2020-06-16 LAB — CBC WITH DIFFERENTIAL/PLATELET
Basophils Absolute: 0 10*3/uL (ref 0.0–0.1)
Basophils Relative: 0.4 % (ref 0.0–3.0)
Eosinophils Absolute: 0.1 10*3/uL (ref 0.0–0.7)
Eosinophils Relative: 1.5 % (ref 0.0–5.0)
HCT: 42.1 % (ref 39.0–52.0)
Hemoglobin: 14.4 g/dL (ref 13.0–17.0)
Lymphocytes Relative: 29.4 % (ref 12.0–46.0)
Lymphs Abs: 2.1 10*3/uL (ref 0.7–4.0)
MCHC: 34.2 g/dL (ref 30.0–36.0)
MCV: 94.2 fl (ref 78.0–100.0)
Monocytes Absolute: 0.3 10*3/uL (ref 0.1–1.0)
Monocytes Relative: 4.4 % (ref 3.0–12.0)
Neutro Abs: 4.5 10*3/uL (ref 1.4–7.7)
Neutrophils Relative %: 64.3 % (ref 43.0–77.0)
Platelets: 311 10*3/uL (ref 150.0–400.0)
RBC: 4.47 Mil/uL (ref 4.22–5.81)
RDW: 12.5 % (ref 11.5–15.5)
WBC: 7 10*3/uL (ref 4.0–10.5)

## 2020-06-16 LAB — HEPATIC FUNCTION PANEL
ALT: 58 U/L — ABNORMAL HIGH (ref 0–53)
AST: 37 U/L (ref 0–37)
Albumin: 4.7 g/dL (ref 3.5–5.2)
Alkaline Phosphatase: 51 U/L (ref 39–117)
Bilirubin, Direct: 0.1 mg/dL (ref 0.0–0.3)
Total Bilirubin: 0.8 mg/dL (ref 0.2–1.2)
Total Protein: 7.3 g/dL (ref 6.0–8.3)

## 2020-06-16 LAB — URINALYSIS, ROUTINE W REFLEX MICROSCOPIC
Bilirubin Urine: NEGATIVE
Hgb urine dipstick: NEGATIVE
Ketones, ur: NEGATIVE
Leukocytes,Ua: NEGATIVE
Nitrite: NEGATIVE
RBC / HPF: NONE SEEN (ref 0–?)
Specific Gravity, Urine: 1.025 (ref 1.000–1.030)
Total Protein, Urine: NEGATIVE
Urine Glucose: NEGATIVE
Urobilinogen, UA: 0.2 (ref 0.0–1.0)
pH: 6 (ref 5.0–8.0)

## 2020-06-16 LAB — LIPID PANEL
Cholesterol: 194 mg/dL (ref 0–200)
HDL: 60 mg/dL (ref >39.00)
LDL Cholesterol: 99 mg/dL (ref 0–99)
NonHDL: 134.09
Total CHOL/HDL Ratio: 3
Triglycerides: 174 mg/dL — ABNORMAL HIGH (ref 0.0–149.0)
VLDL: 34.8 mg/dL (ref 0.0–40.0)

## 2020-06-16 LAB — BASIC METABOLIC PANEL
BUN: 11 mg/dL (ref 6–23)
CO2: 31 mEq/L (ref 19–32)
Calcium: 10 mg/dL (ref 8.4–10.5)
Chloride: 101 mEq/L (ref 96–112)
Creatinine, Ser: 0.85 mg/dL (ref 0.40–1.50)
GFR: 101.9 mL/min (ref >60.00)
Glucose, Bld: 91 mg/dL (ref 70–99)
Potassium: 4.6 mEq/L (ref 3.5–5.1)
Sodium: 139 mEq/L (ref 135–145)

## 2020-06-16 LAB — VITAMIN D 25 HYDROXY (VIT D DEFICIENCY, FRACTURES): VITD: 13.61 ng/mL — ABNORMAL LOW (ref 30.00–100.00)

## 2020-06-16 LAB — PSA: PSA: 0.43 ng/mL (ref 0.10–4.00)

## 2020-06-16 LAB — TSH: TSH: 1.31 u[IU]/mL (ref 0.35–4.50)

## 2020-06-16 LAB — HEMOGLOBIN A1C: Hgb A1c MFr Bld: 5.9 % (ref 4.6–6.5)

## 2020-06-16 LAB — VITAMIN B12: Vitamin B-12: 391 pg/mL (ref 211–911)

## 2020-06-16 NOTE — Progress Notes (Signed)
Patient ID: Chad [name], male   DOB: April 09, 1970, 50 y.o.   MRN: [id number]         Chief Complaint:: wellness exam and Follow-up  hyperglycemia       HPI:  Chad [name] is a 50 y.o. male here for wellness exam; due for hep c screen, o/w up to date with preventive referrals and immunizations.  Last colonoscopy approx 2 yrs with Gi Dr Clent Ridges - we will call for records.                         Also states drinks ETOH about 6 beers at at time at 3 times per wk.  Pt denies chest pain, increased sob or doe, wheezing, orthopnea, PND, increased LE swelling, palpitations, dizziness or syncope.   Pt denies polydipsia, polyuria  Denies new focal neuro s/s.  Denies worsening depressive symptoms, suicidal ideation, or panic;  Pt denies fever, wt loss, night sweats, loss of appetite, or other constitutional symptoms  No other new complaints   Wt Readings from Last 3 Encounters:  06/16/20 170 lb (77.1 kg)  02/11/20 173 lb (78.5 kg)  11/25/19 166 lb (75.3 kg)   BP Readings from Last 3 Encounters:  06/16/20 122/84  02/11/20 110/80  11/25/19 112/68   Immunization History  Administered Date(s) Administered  . Influenza Split 12/19/2011  . Influenza,inj,Quad PF,6+ Mos 03/23/2013, 12/31/2013, 04/06/2015, 01/11/2016, 12/20/2016, 12/12/2017, 12/09/2018  . Influenza,inj,Quad PF,6-35 Mos 12/18/2018  . Influenza,inj,quad, With Preservative 03/23/2013, 12/31/2013, 04/06/2015, 01/11/2016, 12/20/2016, 12/12/2017  . PFIZER(Purple Top)SARS-COV-2 Vaccination 05/29/2019, 06/25/2019, 12/30/2019  . Tdap 12/12/2017   There are no preventive care reminders to display for this patient.    Past Medical History:  Diagnosis Date  . AICD (automatic cardioverter/defibrillator) present   . Brugada syndrome    s/p ICD 12/07  . DVT (deep venous thrombosis) (HCC)   . Dysrhythmia   . Echocardiogram    Echo 10/19: EF 60-65, no RWMA, mild LAE, mild TR, PASP 33  . History of Carotid Doppler ultrasound    Carotid US 10/19: normal   . Hyperlipidemia   . Presence of permanent cardiac pacemaker   . Syncope    s/p ICD 12/07   Past Surgical History:  Procedure Laterality Date  . BACK SURGERY    . CARDIAC DEFIBRILLATOR PLACEMENT  02/2006   [employer]  . FINGER TENDON REPAIR Left    2nd digit  . ICD GENERATOR CHANGE  03/14/2016   ICD GENERATOR REMOVAL AND INSERTION OF NEW ICD, WITH LEAD  REMOVAL EXTRACTION Hattie Perch 03/14/2016  . ICD LEAD REMOVAL N/A 03/14/2016   Procedure: ICD GENERATOR REMOVAL AND INSERTION OF NEW ICD, WITH LEAD  REMOVAL EXTRACTION;  Surgeon: Marinus Maw, MD;  Location: [facility] OR;  Service: Cardiovascular;  Laterality: N/A;  . LUMBAR DISC SURGERY  1997  . TEE WITHOUT CARDIOVERSION N/A 03/14/2016   Procedure: TRANSESOPHAGEAL ECHOCARDIOGRAM (TEE);  Surgeon: Marinus Maw, MD;  Location: [facility] OR;  Service: Cardiovascular;  Laterality: N/A;  . UPPER EXTREMITY VENOGRAPHY Bilateral 05/12/2016   Procedure: Central Venography;  Surgeon: Sherren Kerns, MD;  Location: [facility] INVASIVE CV LAB;  Service: Cardiovascular;  Laterality: Bilateral;    reports that he quit smoking about 9 years ago. His smoking use included cigarettes. He has a 23.00 pack-year smoking history. He has never used smokeless tobacco. He reports current alcohol use of about 6.0 standard drinks of alcohol per week. He reports that he does not use drugs.  family history includes Diabetes in his mother; Heart disease in his mother; Hyperlipidemia in his mother. No Known Allergies Current Outpatient Medications on File Prior to Visit  Medication Sig Dispense Refill  . aspirin EC 81 MG tablet Take 81 mg by mouth daily.    Marland Kitchen atorvastatin (LIPITOR) 20 MG tablet Take 1 tablet (20 mg total) by mouth daily. 30 tablet 5  . co-enzyme Q-10 50 MG capsule Take 50 mg by mouth daily.    Marland Kitchen gabapentin (NEURONTIN) 100 MG capsule Take 1 capsule (100 mg total) by mouth 3 (three) times daily. 90 capsule 3  . Ginkgo Biloba 120 MG TABS Take by mouth.    . meloxicam (MOBIC)  7.5 MG tablet Take 7.5 mg by mouth daily.    Marland Kitchen omega-3 acid ethyl esters (LOVAZA) 1 g capsule Take 2 capsules (2 g total) by mouth in the morning and at bedtime. 120 capsule 11  . Omega-3 Fatty Acids (FISH OIL) 1000 MG CAPS Take by mouth.    . predniSONE (DELTASONE) 10 MG tablet 2 tabs by mouth per day for 5 days 18 tablet 0  . traMADol (ULTRAM) 50 MG tablet Take 1 tablet (50 mg total) by mouth every 6 (six) hours as needed. 30 tablet 0  . triamcinolone (KENALOG) 0.1 % Apply 1 application topically 2 (two) times daily. 30 g 0  . valACYclovir (VALTREX) 1000 MG tablet Take 1 tablet (1,000 mg total) by mouth 2 (two) times daily as needed. 14 tablet 5  . metoprolol tartrate (LOPRESSOR) 25 MG tablet Take 0.5 tablets (12.5 mg total) by mouth as needed (for increased heart rate and palpitations). 30 tablet 3  . milk thistle 175 MG tablet Take 525 mg by mouth daily.     No current facility-administered medications on file prior to visit.        ROS:  All others reviewed and negative.  Objective        PE:  BP 122/84 (BP Location: Left Arm, Patient Position: Sitting, Cuff Size: Large)   Pulse 61   Ht 5\' 7"  (1.702 m)   Wt 170 lb (77.1 kg)   SpO2 96%   BMI 26.63 kg/m                 Constitutional: Pt appears in NAD               HENT: Head: NCAT.                Right Ear: External ear normal.                 Left Ear: External ear normal.                Eyes: . Pupils are equal, round, and reactive to light. Conjunctivae and EOM are normal               Nose: without d/c or deformity               Neck: Neck supple. Gross normal ROM               Cardiovascular: Normal rate and regular rhythm.                 Pulmonary/Chest: Effort normal and breath sounds without rales or wheezing.                Abd:  Soft, NT, ND, + BS, no organomegaly  Neurological: Pt is alert. At baseline orientation, motor grossly intact               Skin: Skin is warm. No rashes, no other new lesions,  LE edema - none               Psychiatric: Pt behavior is normal without agitation   Micro: none  Cardiac tracings I have personally interpreted today:  none  Pertinent Radiological findings (summarize): none   Lab Results  Component Value Date   WBC 7.0 06/16/2020   HGB 14.4 06/16/2020   HCT 42.1 06/16/2020   PLT 311.0 06/16/2020   GLUCOSE 91 06/16/2020   CHOL 194 06/16/2020   TRIG 174.0 (H) 06/16/2020   HDL 60.00 06/16/2020   LDLDIRECT 72.0 03/20/2018   LDLCALC 99 06/16/2020   ALT 58 (H) 06/16/2020   AST 37 06/16/2020   NA 139 06/16/2020   K 4.6 06/16/2020   CL 101 06/16/2020   CREATININE 0.85 06/16/2020   BUN 11 06/16/2020   CO2 31 06/16/2020   TSH 1.31 06/16/2020   PSA 0.43 06/16/2020   HGBA1C 5.9 06/16/2020   Assessment/Plan:  Jaz Vanecek is a 50 y.o. Asian [4] male with  has a past medical history of AICD (automatic cardioverter/defibrillator) present, Brugada syndrome, DVT (deep venous thrombosis) (HCC), Dysrhythmia, Echocardiogram, History of Carotid Doppler ultrasound, Hyperlipidemia, Presence of permanent cardiac pacemaker, and Syncope.  Encounter for well adult exam with abnormal findings Age and sex appropriate education and counseling updated with regular exercise and diet Referrals for preventative services - for hep c screen Immunizations addressed - none needed Smoking counseling  - none needed Evidence for depression or other mood disorder - none significant Most recent labs reviewed. I have personally reviewed and have noted: 1) the patient's medical and social history 2) The patient's current medications and supplements 3) The patient's height, weight, and BMI have been recorded in the chart   Hyperlipidemia Lab Results  Component Value Date   LDLCALC 99 06/16/2020   Stable, pt to continue current statin liptior 20   Hyperglycemia Lab Results  Component Value Date   HGBA1C 5.9 06/16/2020   Stable, pt to continue current medical treatment   - diet   Alcohol use Pt encouraged to abstain to avoid long term complications such as cirrhosis, heart dz or dementia  Followup: Return in about 1 year (around 06/16/2021).  Oliver Barre, MD 06/21/2020 3:34 AM [facility] Internal Medicine

## 2020-06-16 NOTE — Patient Instructions (Signed)
We will ask for the record from [facility] for the last colonoscopy  Please continue all other medications as before, and refills have been done if requested.  Please have the pharmacy call with any other refills you may need.  Please continue your efforts at being more active, low cholesterol diet, and weight control.  You are otherwise up to date with prevention measures today.  Please keep your appointments with your specialists as you may have planned  Please go to the LAB at the blood drawing area for the tests to be done  You will be contacted by phone if any changes need to be made immediately.  Otherwise, you will receive a letter about your results with an explanation, but please check with MyChart first.  Please remember to sign up for MyChart if you have not done so, as this will be important to you in the future with finding out test results, communicating by private email, and scheduling acute appointments online when needed.  Please make an Appointment to return for your 1 year visit, or sooner if needed

## 2020-06-17 LAB — HEPATITIS C ANTIBODY
Hepatitis C Ab: NONREACTIVE
SIGNAL TO CUT-OFF: 0.02 (ref <1.00)

## 2020-06-21 ENCOUNTER — Encounter: Payer: Self-pay | Admitting: Internal Medicine

## 2020-06-21 NOTE — Assessment & Plan Note (Signed)
Pt encouraged to abstain to avoid long term complications such as cirrhosis, heart dz or dementia

## 2020-06-21 NOTE — Assessment & Plan Note (Signed)
Lab Results  Component Value Date   LDLCALC 99 06/16/2020   Stable, pt to continue current statin liptior 20

## 2020-06-21 NOTE — Assessment & Plan Note (Signed)
Age and sex appropriate education and counseling updated with regular exercise and diet Referrals for preventative services - for hep c screen Immunizations addressed - none needed Smoking counseling  - none needed Evidence for depression or other mood disorder - none significant Most recent labs reviewed. I have personally reviewed and have noted: 1) the patient's medical and social history 2) The patient's current medications and supplements 3) The patient's height, weight, and BMI have been recorded in the chart  

## 2020-06-21 NOTE — Assessment & Plan Note (Signed)
Lab Results  Component Value Date   HGBA1C 5.9 06/16/2020   Stable, pt to continue current medical treatment  - diet  

## 2020-09-01 ENCOUNTER — Ambulatory Visit (INDEPENDENT_AMBULATORY_CARE_PROVIDER_SITE_OTHER): Payer: 59

## 2020-09-01 DIAGNOSIS — I428 Other cardiomyopathies: Secondary | ICD-10-CM | POA: Diagnosis not present

## 2020-09-01 DIAGNOSIS — I429 Cardiomyopathy, unspecified: Secondary | ICD-10-CM

## 2020-09-03 LAB — CUP PACEART REMOTE DEVICE CHECK
Battery Remaining Longevity: 66 mo
Battery Remaining Percentage: 64 %
Battery Voltage: 2.96 V
Brady Statistic RV Percent Paced: 1 %
Date Time Interrogation Session: 20220624050245
HighPow Impedance: 89 Ohm
HighPow Impedance: 89 Ohm
Implantable Lead Implant Date: 20180102
Implantable Lead Location: 753860
Implantable Lead Model: 7122
Implantable Pulse Generator Implant Date: 20180102
Lead Channel Impedance Value: 490 Ohm
Lead Channel Pacing Threshold Amplitude: 1.25 V
Lead Channel Pacing Threshold Pulse Width: 0.6 ms
Lead Channel Sensing Intrinsic Amplitude: 10.7 mV
Lead Channel Setting Pacing Amplitude: 2.5 V
Lead Channel Setting Pacing Pulse Width: 0.6 ms
Lead Channel Setting Sensing Sensitivity: 0.5 mV
Pulse Gen Serial Number: 1246759

## 2020-09-21 NOTE — Progress Notes (Signed)
Remote ICD transmission.   

## 2020-10-06 ENCOUNTER — Other Ambulatory Visit: Payer: Self-pay

## 2020-10-06 ENCOUNTER — Ambulatory Visit (INDEPENDENT_AMBULATORY_CARE_PROVIDER_SITE_OTHER): Payer: 59 | Admitting: Internal Medicine

## 2020-10-06 ENCOUNTER — Encounter: Payer: Self-pay | Admitting: Internal Medicine

## 2020-10-06 VITALS — BP 114/62 | HR 73 | Temp 98.8°F | Ht 67.0 in | Wt 166.0 lb

## 2020-10-06 DIAGNOSIS — R7989 Other specified abnormal findings of blood chemistry: Secondary | ICD-10-CM | POA: Insufficient documentation

## 2020-10-06 DIAGNOSIS — G250 Essential tremor: Secondary | ICD-10-CM

## 2020-10-06 DIAGNOSIS — R739 Hyperglycemia, unspecified: Secondary | ICD-10-CM

## 2020-10-06 DIAGNOSIS — M5412 Radiculopathy, cervical region: Secondary | ICD-10-CM

## 2020-10-06 MED ORDER — CHOLECALCIFEROL 50 MCG (2000 UT) PO TABS: ORAL_TABLET | ORAL | 99 refills | Status: DC

## 2020-10-06 NOTE — Assessment & Plan Note (Signed)
Last vitamin D Lab Results  Component Value Date   VD25OH 13.61 (L) 06/16/2020   Low, to start oral replacement

## 2020-10-06 NOTE — Assessment & Plan Note (Signed)
I cannot appreciate on exam today, but also for referral to neurology, I d/w pt he would need Head CT specificallly for this issue

## 2020-10-06 NOTE — Assessment & Plan Note (Signed)
Recent onset worsening, declines pain medication and gabapentin, unable to MRI due to PPM, for neurology referral

## 2020-10-06 NOTE — Progress Notes (Signed)
Chief Complaint: follow up worsening head tremor, right neck pain with radicular symptoms, and low vit d       HPI:  Chad [name] is a 50 y.o. male here with c/o gradually worsening head tremor notice by other persons and the pt but no pain or other tremor or gait issue noted.  Also c/o 1 mo right neck pain, sharp and dull, intermittent but almost constant recently, mod pain (does not want pain med today even gabapentin) with radiation to the RUE with tingling, pain, and intermittent distal RUE weakness leading to less grip strength and dropping objects.  Pt is left handed  Pt denies chest pain, increased sob or doe, wheezing, orthopnea, PND, increased LE swelling, palpitations, dizziness or syncope.   Pt denies polydipsia, polyuria, Cannot have MRI due to PPM.  Not taking Vit D.         Wt Readings from Last 3 Encounters:  10/06/20 166 lb (75.3 kg)  06/16/20 170 lb (77.1 kg)  02/11/20 173 lb (78.5 kg)   BP Readings from Last 3 Encounters:  10/06/20 114/62  06/16/20 122/84  02/11/20 110/80         Past Medical History:  Diagnosis Date   AICD (automatic cardioverter/defibrillator) present    Brugada syndrome    s/p ICD 12/07   DVT (deep venous thrombosis) (HCC)    Dysrhythmia    Echocardiogram    Echo 10/19: EF 60-65, no RWMA, mild LAE, mild TR, PASP 33   History of Carotid Doppler ultrasound    Carotid US 10/19: normal   Hyperlipidemia    Presence of permanent cardiac pacemaker    Syncope    s/p ICD 12/07   Past Surgical History:  Procedure Laterality Date   BACK SURGERY     CARDIAC DEFIBRILLATOR PLACEMENT  02/2006   [employer]. Jude Atlas   FINGER TENDON REPAIR Left    2nd digit   ICD GENERATOR CHANGE  03/14/2016   ICD GENERATOR REMOVAL AND INSERTION OF NEW ICD, WITH LEAD  REMOVAL EXTRACTION Hattie Perch 03/14/2016   ICD LEAD REMOVAL N/A 03/14/2016   Procedure: ICD GENERATOR REMOVAL AND INSERTION OF NEW ICD, WITH LEAD  REMOVAL EXTRACTION;  Surgeon: Marinus Maw, MD;  Location: [facility] OR;   Service: Cardiovascular;  Laterality: N/A;   LUMBAR DISC SURGERY  1997   TEE WITHOUT CARDIOVERSION N/A 03/14/2016   Procedure: TRANSESOPHAGEAL ECHOCARDIOGRAM (TEE);  Surgeon: Marinus Maw, MD;  Location: [facility] OR;  Service: Cardiovascular;  Laterality: N/A;   UPPER EXTREMITY VENOGRAPHY Bilateral 05/12/2016   Procedure: Central Venography;  Surgeon: Sherren Kerns, MD;  Location: [facility] INVASIVE CV LAB;  Service: Cardiovascular;  Laterality: Bilateral;    reports that he quit smoking about 9 years ago. His smoking use included cigarettes. He has a 23.00 pack-year smoking history. He has never used smokeless tobacco. He reports current alcohol use of about 6.0 standard drinks of alcohol per week. He reports that he does not use drugs. family history includes Diabetes in his mother; Heart disease in his mother; Hyperlipidemia in his mother. No Known Allergies Current Outpatient Medications on File Prior to Visit  Medication Sig Dispense Refill   aspirin EC 81 MG tablet Take 81 mg by mouth daily.     atorvastatin (LIPITOR) 20 MG tablet Take 1 tablet (20 mg total) by mouth daily. 30 tablet 5   co-enzyme Q-10 50 MG capsule Take 50 mg by mouth daily.     gabapentin (NEURONTIN) 100  MG capsule Take 1 capsule (100 mg total) by mouth 3 (three) times daily. 90 capsule 3   Ginkgo Biloba 120 MG TABS Take by mouth.     meloxicam (MOBIC) 7.5 MG tablet Take 7.5 mg by mouth daily.     milk thistle 175 MG tablet Take 525 mg by mouth daily.     omega-3 acid ethyl esters (LOVAZA) 1 g capsule Take 2 capsules (2 g total) by mouth in the morning and at bedtime. 120 capsule 11   Omega-3 Fatty Acids (FISH OIL) 1000 MG CAPS Take by mouth.     predniSONE (DELTASONE) 10 MG tablet 2 tabs by mouth per day for 5 days 18 tablet 0   traMADol (ULTRAM) 50 MG tablet Take 1 tablet (50 mg total) by mouth every 6 (six) hours as needed. 30 tablet 0   triamcinolone (KENALOG) 0.1 % Apply 1 application topically 2 (two) times daily. 30 g 0    valACYclovir (VALTREX) 1000 MG tablet Take 1 tablet (1,000 mg total) by mouth 2 (two) times daily as needed. 14 tablet 5   metoprolol tartrate (LOPRESSOR) 25 MG tablet Take 0.5 tablets (12.5 mg total) by mouth as needed (for increased heart rate and palpitations). 30 tablet 3   No current facility-administered medications on file prior to visit.        ROS:  All others reviewed and negative.  Objective        PE:  BP 114/62   Pulse 73   Temp 98.8 F (37.1 C) (Oral)   Ht 5\' 7"  (1.702 m)   Wt 166 lb (75.3 kg)   SpO2 98%   BMI 26.00 kg/m                 Constitutional: Pt appears in NAD               HENT: Head: NCAT.                Right Ear: External ear normal.                 Left Ear: External ear normal.                Eyes: . Pupils are equal, round, and reactive to light. Conjunctivae and EOM are normal               Nose: without d/c or deformity               Neck: Neck supple. Gross normal ROM               Cardiovascular: Normal rate and regular rhythm.                 Pulmonary/Chest: Effort normal and breath sounds without rales or wheezing.                Abd:  Soft, NT, ND, + BS, no organomegaly               Neurological: Pt is alert. At baseline orientation, motor grossly intact               Skin: Skin is warm. No rashes, no other new lesions, LE edema - none               Psychiatric: Pt behavior is normal without agitation   Micro: none  Cardiac tracings I have personally interpreted today:  none  Pertinent Radiological findings (summarize): none   Lab  Results  Component Value Date   WBC 7.0 06/16/2020   HGB 14.4 06/16/2020   HCT 42.1 06/16/2020   PLT 311.0 06/16/2020   GLUCOSE 91 06/16/2020   CHOL 194 06/16/2020   TRIG 174.0 (H) 06/16/2020   HDL 60.00 06/16/2020   LDLDIRECT 72.0 03/20/2018   LDLCALC 99 06/16/2020   ALT 58 (H) 06/16/2020   AST 37 06/16/2020   NA 139 06/16/2020   K 4.6 06/16/2020   CL 101 06/16/2020   CREATININE 0.85 06/16/2020    BUN 11 06/16/2020   CO2 31 06/16/2020   TSH 1.31 06/16/2020   PSA 0.43 06/16/2020   HGBA1C 5.9 06/16/2020   Assessment/Plan:  Chad [name] is a 50 y.o. Asian [4] male with  has a past medical history of AICD (automatic cardioverter/defibrillator) present, Brugada syndrome, DVT (deep venous thrombosis) (HCC), Dysrhythmia, Echocardiogram, History of Carotid Doppler ultrasound, Hyperlipidemia, Presence of permanent cardiac pacemaker, and Syncope.  Right cervical radiculopathy Recent onset worsening, declines pain medication and gabapentin, unable to MRI due to PPM, for neurology referral  Benign head tremor I cannot appreciate on exam today, but also for referral to neurology, I d/w pt he would need Head CT specificallly for this issue  Hyperglycemia Lab Results  Component Value Date   HGBA1C 5.9 06/16/2020   Stable, pt to continue current medical treatment  - diet   Low vitamin D level Last vitamin D Lab Results  Component Value Date   VD25OH 13.61 (L) 06/16/2020   Low, to start oral replacement  Followup: Return if symptoms worsen or fail to improve.  Oliver Barre, MD 10/06/2020 8:20 PM [facility] - [facility] Internal Medicine

## 2020-10-06 NOTE — Patient Instructions (Signed)
Please take OTC Vitamin D3 at 2000 units per day, indefinitely  You will be contacted regarding the referral for: Neurology for the head tremor AND the right neck pain  Please continue all other medications as before, and refills have been done if requested.  Please have the pharmacy call with any other refills you may need.  Please continue your efforts at being more active, low cholesterol diet, and weight control  Please keep your appointments with your specialists as you may have planned

## 2020-10-06 NOTE — Assessment & Plan Note (Signed)
Lab Results  Component Value Date   HGBA1C 5.9 06/16/2020   Stable, pt to continue current medical treatment  - diet  

## 2020-10-08 ENCOUNTER — Encounter: Payer: Self-pay | Admitting: Physician Assistant

## 2020-10-18 NOTE — Progress Notes (Signed)
[name]rology [facility] Clinic Note - Initial Visit   Date: 10/19/20  Chad [name] MRN: [id number] DOB: 06-12-1970   Dear Dr Jonny Ruiz, Len Blalock, MD:  Thank you for your kind referral of Chad [name] for consultation of right cervical and arm pain, and head titubation.  Although his history is well known to you, please allow [place] to reiterate it for the purpose of our medical record. The patient is here alone, no interpreter is with him, he can speak [place] well.     History of Present Illness: Chad [name] is a 50 y.o. left -handed male with a history of Brugada syndrome , Tachy brady syndrome s/p  PMP, history of left upper extremity DVT on chronic as bleeding, due to pacemaker placement, history of herpes simplex, presenting for evaluation of right  cervical and arm pain and involuntary neck movement.   About 3 months ago, the patient began having right neck and shoulder pain, radiating down to his mid forearm and is better when resting at night.  He usually takes Tylenol with relief.  He was offered gabapentin by PCP but he declined to use it. He reports the pain being about 5 times a week, "going away for about 2 hours, and then coming back ", it is sharp and dull, and moderate in severity, radiating to the right upper extremity, with some tingling, but no numbness.  He first was seen by his PCP.  Of note, the patient has similar symptoms a few years ago, and he reported that the x-rays were normal at that time.  He does not have upper extremities weakness or paresthesias.  In addition, the patient complains of 15-year history of involuntary head tremors, which they are not always present.  He states that he hardly ever notices, but his friends notices it.  He denies any pain.  He states the fatigue and stress make it worse.  He denies drinking caffeine.  He drinks about 5-7 beers on a weekend, which did not change the tremor.  He denies any dysphagia.  Apparently, he was seen at our  office in 2015 with the same complaints, but no tremor was noted on exam, or titubation.  The patient was adamant to wanting to have an MRI performed, and it was explained to him that because of his pacemaker, no MRI was going to be done, and that no head CT was indicated either. During this visit, the patient complains about the same symptoms, and he states that these come and go, but they are not life limiting.   He denies any dizziness, falls, vertigo, trauma, fever, chills, night sweats, swelling.  He denies any shortness of breath or cough.  He denies any chest pain or palpitations.  No confusion.  No recent infections.    Out-side paper records, electronic medical record, and images have been reviewed where available and summarized as:  Lab Results  Component Value Date   HGBA1C 5.9 06/16/2020   Lab Results  Component Value Date   VITAMINB12 391 06/16/2020   Lab Results  Component Value Date   TSH 1.31 06/16/2020   Lab Results  Component Value Date   ESRSEDRATE 6 12/20/2016    Past Medical History:  Diagnosis Date   AICD (automatic cardioverter/defibrillator) present    Brugada syndrome    s/p ICD 12/07   DVT (deep venous thrombosis) (HCC)    Dysrhythmia    Echocardiogram    Echo 10/19: EF 60-65, no RWMA, mild LAE,  mild TR, PASP 33   History of Carotid Doppler ultrasound    Carotid US 10/19: normal   Hyperlipidemia    Presence of permanent cardiac pacemaker    Syncope    s/p ICD 12/07    Past Surgical History:  Procedure Laterality Date   BACK SURGERY     CARDIAC DEFIBRILLATOR PLACEMENT  02/2006   [employer] Atlas   FINGER TENDON REPAIR Left    2nd digit   ICD GENERATOR CHANGE  03/14/2016   ICD GENERATOR REMOVAL AND INSERTION OF NEW ICD, WITH LEAD  REMOVAL EXTRACTION Hattie Perch 03/14/2016   ICD LEAD REMOVAL N/A 03/14/2016   Procedure: ICD GENERATOR REMOVAL AND INSERTION OF NEW ICD, WITH LEAD  REMOVAL EXTRACTION;  Surgeon: Marinus Maw, MD;  Location: [facility] OR;  Service:  Cardiovascular;  Laterality: N/A;   LUMBAR DISC SURGERY  1997   TEE WITHOUT CARDIOVERSION N/A 03/14/2016   Procedure: TRANSESOPHAGEAL ECHOCARDIOGRAM (TEE);  Surgeon: Marinus Maw, MD;  Location: [facility] OR;  Service: Cardiovascular;  Laterality: N/A;   UPPER EXTREMITY VENOGRAPHY Bilateral 05/12/2016   Procedure: Central Venography;  Surgeon: Sherren Kerns, MD;  Location: [facility] INVASIVE CV LAB;  Service: Cardiovascular;  Laterality: Bilateral;     Medications:  Outpatient Encounter Medications as of 10/19/2020  Medication Sig   aspirin EC 81 MG tablet Take 81 mg by mouth daily.   atorvastatin (LIPITOR) 20 MG tablet Take 1 tablet (20 mg total) by mouth daily.   Cholecalciferol 50 MCG (2000 UT) TABS 1 tab by mouth once daily   co-enzyme Q-10 50 MG capsule Take 50 mg by mouth daily.   Ginkgo Biloba 120 MG TABS Take by mouth.   milk thistle 175 MG tablet Take 525 mg by mouth daily.   omega-3 acid ethyl esters (LOVAZA) 1 g capsule Take 2 capsules (2 g total) by mouth in the morning and at bedtime.   gabapentin (NEURONTIN) 100 MG capsule Take 1 capsule (100 mg total) by mouth 3 (three) times daily. (Patient not taking: Reported on 10/19/2020)   meloxicam (MOBIC) 7.5 MG tablet Take 7.5 mg by mouth daily. (Patient not taking: Reported on 10/19/2020)   metoprolol tartrate (LOPRESSOR) 25 MG tablet Take 0.5 tablets (12.5 mg total) by mouth as needed (for increased heart rate and palpitations). (Patient not taking: Reported on 10/19/2020)   Omega-3 Fatty Acids (FISH OIL) 1000 MG CAPS Take by mouth. (Patient not taking: Reported on 10/19/2020)   predniSONE (DELTASONE) 10 MG tablet 2 tabs by mouth per day for 5 days (Patient not taking: Reported on 10/19/2020)   traMADol (ULTRAM) 50 MG tablet Take 1 tablet (50 mg total) by mouth every 6 (six) hours as needed. (Patient not taking: Reported on 10/19/2020)   triamcinolone (KENALOG) 0.1 % Apply 1 application topically 2 (two) times daily. (Patient not taking: Reported on 10/19/2020)    valACYclovir (VALTREX) 1000 MG tablet Take 1 tablet (1,000 mg total) by mouth 2 (two) times daily as needed. (Patient not taking: Reported on 10/19/2020)   No facility-administered encounter medications on file as of 10/19/2020.    Allergies: No Known Allergies  Family History: Family History  Problem Relation Age of Onset   Hyperlipidemia Mother    Heart disease Mother    Diabetes Mother     Social History: Social History   Tobacco Use   Smoking status: Former    Packs/day: 1.00    Years: 23.00    Pack years: 23.00    Types: Cigarettes  Quit date: 03/14/2011    Years since quitting: 9.6   Smokeless tobacco: Never  Vaping Use   Vaping Use: Never used  Substance Use Topics   Alcohol use: Yes    Alcohol/week: 6.0 standard drinks    Types: 6 Cans of beer per week   Drug use: No   Social History   Social History Narrative   Married   Right handed   One story home    Vital Signs:  BP 129/89   Pulse (!) 59   Resp 20   Ht 5\' 7"  (1.702 m)   Wt 166 lb (75.3 kg)   SpO2 99%   BMI 26.00 kg/m    General Medical Exam: Well-developed, well-nourished 50 year old male in no acute distress alert and oriented x3  General:  Well appearing, comfortable.   Eyes/ENT: see cranial nerve examination.   Neck:   No carotid bruits. Respiratory:  Clear to auscultation, good air entry bilaterally.   Cardiac:  Regular rate and rhythm, no murmur.  Left pacemaker Extremities:  No deformities, edema, or skin discoloration.  Skin:  No rashes or lesions.  Neurological Exam: MENTAL STATUS including orientation to time, place, person, recent and remote memory, attention span and concentration, language, and fund of knowledge is normal.  Speech is not dysarthric.  CRANIAL NERVES: II:  No visual field defects.  Unremarkable fundi.   III-IV-VI: Pupils equal round and reactive to light.  Normal conjugate, extra-ocular eye movements in all directions of gaze.  No nystagmus.  No ptosis.   V:   Normal facial sensation.    VII:  Normal facial symmetry and movements.   VIII:  Normal hearing and vestibular function.   IX-X:  Normal palatal movement.   XI:  Normal shoulder shrug and head rotation.   XII:  Normal tongue strength and range of motion, no deviation or fasciculation.  MOTOR:  No atrophy, fasciculations or abnormal movements.  No pronator drift.  Of note, no help titubation or tremor was noted during this 60-minute visit. He has positive Spurling test, positive stretch test, positive upper limb tension test, and positive pain with rotating 60 degrees his head. He has 5 of 5 strength in upper and lower extremities.   MSRs:  Right        Left                  brachioradialis 1+  2+  biceps 1+  2+  triceps 1+  2+  patellar 2+  2+  ankle jerk 2+  2+  Hoffman no  no  plantar response down  down   SENSORY:  Normal and symmetric perception of light touch, pinprick, vibration, and proprioception.  Romberg's sign absent.   COORDINATION/GAIT: Normal finger-to- nose-finger and heel-to-shin.  Intact rapid alternating movements bilaterally.  Able to rise from a chair without using arms.  Gait narrow based and stable. Tandem and stressed gait intact.    Assessment and plan  Right cervical radiculopathy:  The patient tested positive for Spurling test, stretch test, positive upper limb tension test, and had decreased right upper extremity reflexes, suggestive of radiculopathy. Recommend physical therapy for at least 6 weeks, to improve his symptoms Will order electromyography-nerve conduction studies of the right upper extremity to evaluate for cervical radiculopathy, or other abnormalities Return in 2 months after the results are available for discussion and further plans  Of note, the patient requested that we give him pain medication, because he has to leave town, but we  explained that before giving pain medications, we need to evaluate the source of his pain, and if necessary  a referral to the pain clinic.  His symptoms are intermittent, and are well controlled with Tylenol at this point.  He appeared frustrated because we did not order a CT of the head and neck.  We explained that if necessary will be ordering, but first physical therapy and EMG-NCS need to be studied.  He verbalizes frustration about this issue, but we explained that we are doing the proper steps to find the source of his abnormality.  Head titubation  This has been present for at least 15 years per patient report, and has undergone prior neurological evaluations for this issue.  However, during these 60-minute visit, and a prior visit with one of our neurologists in 2015, titubation could not be reproduced.  The patient wanted a CT of the head, and once again it was explained that there is no indication for days at this time.   If the symptoms become worse, and cervical dystonia is evident, we will proceed with further evaluation and treatment options   Total time spent: 60  mins    Thank you for allowing me to participate in patient's care.  If I can answer any additional questions, I would be pleased to do so.    Sincerely,   Marlowe Kays, PA-C

## 2020-10-19 ENCOUNTER — Encounter: Payer: Self-pay | Admitting: Physician Assistant

## 2020-10-19 ENCOUNTER — Ambulatory Visit: Payer: 59 | Admitting: Physician Assistant

## 2020-10-19 ENCOUNTER — Other Ambulatory Visit: Payer: Self-pay

## 2020-10-19 VITALS — BP 129/89 | HR 59 | Resp 20 | Ht 67.0 in | Wt 166.0 lb

## 2020-10-19 DIAGNOSIS — M5410 Radiculopathy, site unspecified: Secondary | ICD-10-CM | POA: Diagnosis not present

## 2020-10-19 NOTE — Patient Instructions (Signed)
We suspect that you have Cervical Radiculopathy, which is "pinched nerve" Plan is  To proceed with Physical therapy for 6 week s  EMG or  electromyogram to study the root of the nerve problem  Return in bout 8 weeks to go over the results and see how you did with your physical therapy

## 2020-10-26 ENCOUNTER — Encounter: Payer: 59 | Admitting: Neurology

## 2020-11-03 ENCOUNTER — Ambulatory Visit: Payer: 59 | Admitting: Physical Therapy

## 2020-11-26 ENCOUNTER — Telehealth: Payer: Self-pay | Admitting: Internal Medicine

## 2020-11-26 MED ORDER — OMEGA-3-ACID ETHYL ESTERS 1 G PO CAPS: 2.0000 g | ORAL_CAPSULE | Freq: Two times a day (BID) | ORAL | 11 refills | Status: DC

## 2020-11-26 NOTE — Telephone Encounter (Signed)
1.Medication Requested: omega-3 acid ethyl esters (LOVAZA) 1 g capsule   2. Pharmacy (Name, Street, [facility]): [facility] #[id number] - [place], [place] - [address] AT [facility] OF [facility] & [address]  Phone:  [phone] Fax:  [phone]   3. On Med List: yes  4. Last Visit with PCP: 07.27.22  5. Next visit date with PCP: 04.12.23   Agent: Please be advised that RX refills may take up to 3 business days. We ask that you follow-up with your pharmacy.

## 2020-12-01 ENCOUNTER — Encounter: Payer: 59 | Admitting: Neurology

## 2020-12-01 ENCOUNTER — Ambulatory Visit (INDEPENDENT_AMBULATORY_CARE_PROVIDER_SITE_OTHER): Payer: 59

## 2020-12-01 DIAGNOSIS — I428 Other cardiomyopathies: Secondary | ICD-10-CM

## 2020-12-01 DIAGNOSIS — I429 Cardiomyopathy, unspecified: Secondary | ICD-10-CM

## 2020-12-04 LAB — CUP PACEART REMOTE DEVICE CHECK
Battery Remaining Longevity: 62 mo
Battery Remaining Percentage: 62 %
Battery Voltage: 2.96 V
Brady Statistic RV Percent Paced: 1 %
Date Time Interrogation Session: 20220924015026
HighPow Impedance: 82 Ohm
HighPow Impedance: 82 Ohm
Implantable Lead Implant Date: 20180102
Implantable Lead Location: 753860
Implantable Lead Model: 7122
Implantable Pulse Generator Implant Date: 20180102
Lead Channel Impedance Value: 460 Ohm
Lead Channel Pacing Threshold Amplitude: 1.25 V
Lead Channel Pacing Threshold Pulse Width: 0.6 ms
Lead Channel Sensing Intrinsic Amplitude: 5.5 mV
Lead Channel Setting Pacing Amplitude: 2.5 V
Lead Channel Setting Pacing Pulse Width: 0.6 ms
Lead Channel Setting Sensing Sensitivity: 0.5 mV
Pulse Gen Serial Number: 1246759

## 2020-12-08 NOTE — Progress Notes (Signed)
Remote ICD transmission.   

## 2020-12-09 ENCOUNTER — Telehealth: Payer: Self-pay | Admitting: Internal Medicine

## 2020-12-09 MED ORDER — ATORVASTATIN CALCIUM 20 MG PO TABS
20.0000 mg | ORAL_TABLET | Freq: Every day | ORAL | 2 refills | Status: DC
Start: 2020-12-09 — End: 2021-12-28

## 2020-12-09 NOTE — Telephone Encounter (Signed)
   Patient calling to request 90 day supply of atorvastatin (LIPITOR) 20 MG tablet   Pharmacy [facility] #[id number] - [place], [place] - [address] AT [facility] OF [address]

## 2020-12-13 ENCOUNTER — Ambulatory Visit: Payer: 59 | Admitting: Physician Assistant

## 2021-03-02 ENCOUNTER — Ambulatory Visit (INDEPENDENT_AMBULATORY_CARE_PROVIDER_SITE_OTHER): Payer: 59

## 2021-03-02 DIAGNOSIS — I429 Cardiomyopathy, unspecified: Secondary | ICD-10-CM

## 2021-03-02 DIAGNOSIS — R55 Syncope and collapse: Secondary | ICD-10-CM

## 2021-03-04 LAB — CUP PACEART REMOTE DEVICE CHECK
Battery Remaining Longevity: 62 mo
Battery Remaining Percentage: 61 %
Battery Voltage: 2.96 V
Brady Statistic RV Percent Paced: 1 %
Date Time Interrogation Session: 20221223031425
HighPow Impedance: 87 Ohm
HighPow Impedance: 87 Ohm
Implantable Lead Implant Date: 20180102
Implantable Lead Location: 753860
Implantable Lead Model: 7122
Implantable Pulse Generator Implant Date: 20180102
Lead Channel Impedance Value: 510 Ohm
Lead Channel Pacing Threshold Amplitude: 1.25 V
Lead Channel Pacing Threshold Pulse Width: 0.6 ms
Lead Channel Sensing Intrinsic Amplitude: 9.3 mV
Lead Channel Setting Pacing Amplitude: 2.5 V
Lead Channel Setting Pacing Pulse Width: 0.6 ms
Lead Channel Setting Sensing Sensitivity: 0.5 mV
Pulse Gen Serial Number: 1246759

## 2021-03-11 NOTE — Progress Notes (Signed)
Remote ICD transmission.   

## 2021-03-18 ENCOUNTER — Telehealth: Payer: Self-pay | Admitting: Internal Medicine

## 2021-03-18 MED ORDER — ICOSAPENT ETHYL 1 G PO CAPS: 2.0000 g | ORAL_CAPSULE | Freq: Two times a day (BID) | ORAL | 11 refills | Status: DC

## 2021-03-18 NOTE — Telephone Encounter (Signed)
Patient wife Phi calling in  Says patient new insurance plan is now [employer] & they dont cover the med omega-3 acid ethyl esters (LOVAZA) 1 g capsule but they do cover the alternative Vascepa  Requesting new rx for Vascepa a 30DS be sent to pharmacy  [facility] #[id number] [place], [place] - [address] AT [facility] OF [facility] & [address]  Phone:  [phone] Fax:  [phone]   After 30DS patient would like to transfer rx to mail order for 90DS

## 2021-03-18 NOTE — Telephone Encounter (Signed)
Not on patients med list

## 2021-03-18 NOTE — Telephone Encounter (Signed)
Ok done

## 2021-03-18 NOTE — Telephone Encounter (Signed)
Patient notified

## 2021-04-07 ENCOUNTER — Other Ambulatory Visit: Payer: Self-pay

## 2021-04-07 ENCOUNTER — Other Ambulatory Visit: Payer: Self-pay | Admitting: Internal Medicine

## 2021-04-07 ENCOUNTER — Encounter: Payer: Self-pay | Admitting: Internal Medicine

## 2021-04-07 ENCOUNTER — Ambulatory Visit (INDEPENDENT_AMBULATORY_CARE_PROVIDER_SITE_OTHER): Payer: 59 | Admitting: Internal Medicine

## 2021-04-07 VITALS — BP 110/78 | HR 61 | Ht 67.0 in | Wt 167.6 lb

## 2021-04-07 DIAGNOSIS — Z0001 Encounter for general adult medical examination with abnormal findings: Secondary | ICD-10-CM | POA: Diagnosis not present

## 2021-04-07 DIAGNOSIS — Z23 Encounter for immunization: Secondary | ICD-10-CM | POA: Diagnosis not present

## 2021-04-07 DIAGNOSIS — E782 Mixed hyperlipidemia: Secondary | ICD-10-CM

## 2021-04-07 DIAGNOSIS — R7989 Other specified abnormal findings of blood chemistry: Secondary | ICD-10-CM | POA: Diagnosis not present

## 2021-04-07 DIAGNOSIS — Z789 Other specified health status: Secondary | ICD-10-CM

## 2021-04-07 DIAGNOSIS — R1013 Epigastric pain: Secondary | ICD-10-CM | POA: Diagnosis not present

## 2021-04-07 DIAGNOSIS — E538 Deficiency of other specified B group vitamins: Secondary | ICD-10-CM

## 2021-04-07 DIAGNOSIS — R739 Hyperglycemia, unspecified: Secondary | ICD-10-CM

## 2021-04-07 DIAGNOSIS — R1084 Generalized abdominal pain: Secondary | ICD-10-CM

## 2021-04-07 LAB — CBC WITH DIFFERENTIAL/PLATELET
Basophils Absolute: 0 10*3/uL (ref 0.0–0.1)
Basophils Relative: 0.4 % (ref 0.0–3.0)
Eosinophils Absolute: 0.1 10*3/uL (ref 0.0–0.7)
Eosinophils Relative: 1.3 % (ref 0.0–5.0)
HCT: 42.4 % (ref 39.0–52.0)
Hemoglobin: 13.8 g/dL (ref 13.0–17.0)
Lymphocytes Relative: 40.1 % (ref 12.0–46.0)
Lymphs Abs: 2.4 10*3/uL (ref 0.7–4.0)
MCHC: 32.6 g/dL (ref 30.0–36.0)
MCV: 96 fl (ref 78.0–100.0)
Monocytes Absolute: 0.3 10*3/uL (ref 0.1–1.0)
Monocytes Relative: 5.4 % (ref 3.0–12.0)
Neutro Abs: 3.1 10*3/uL (ref 1.4–7.7)
Neutrophils Relative %: 52.8 % (ref 43.0–77.0)
Platelets: 297 10*3/uL (ref 150.0–400.0)
RBC: 4.41 Mil/uL (ref 4.22–5.81)
RDW: 12.8 % (ref 11.5–15.5)
WBC: 6 10*3/uL (ref 4.0–10.5)

## 2021-04-07 LAB — LIPID PANEL
Cholesterol: 152 mg/dL (ref 0–200)
HDL: 44.9 mg/dL (ref >39.00)
LDL Cholesterol: 74 mg/dL (ref 0–99)
NonHDL: 107.54
Total CHOL/HDL Ratio: 3
Triglycerides: 169 mg/dL — ABNORMAL HIGH (ref 0.0–149.0)
VLDL: 33.8 mg/dL (ref 0.0–40.0)

## 2021-04-07 LAB — PSA: PSA: 0.39 ng/mL (ref 0.10–4.00)

## 2021-04-07 LAB — URINALYSIS, ROUTINE W REFLEX MICROSCOPIC
Bilirubin Urine: NEGATIVE
Hgb urine dipstick: NEGATIVE
Ketones, ur: NEGATIVE
Leukocytes,Ua: NEGATIVE
Nitrite: NEGATIVE
RBC / HPF: NONE SEEN (ref 0–?)
Specific Gravity, Urine: 1.025 (ref 1.000–1.030)
Total Protein, Urine: NEGATIVE
Urine Glucose: NEGATIVE
Urobilinogen, UA: 0.2 (ref 0.0–1.0)
pH: 6 (ref 5.0–8.0)

## 2021-04-07 LAB — VITAMIN B12: Vitamin B-12: 380 pg/mL (ref 211–911)

## 2021-04-07 LAB — VITAMIN D 25 HYDROXY (VIT D DEFICIENCY, FRACTURES): VITD: 42.88 ng/mL (ref 30.00–100.00)

## 2021-04-07 LAB — BASIC METABOLIC PANEL
BUN: 9 mg/dL (ref 6–23)
CO2: 29 mEq/L (ref 19–32)
Calcium: 9.6 mg/dL (ref 8.4–10.5)
Chloride: 104 mEq/L (ref 96–112)
Creatinine, Ser: 0.82 mg/dL (ref 0.40–1.50)
GFR: 102.42 mL/min (ref >60.00)
Glucose, Bld: 96 mg/dL (ref 70–99)
Potassium: 4.4 mEq/L (ref 3.5–5.1)
Sodium: 140 mEq/L (ref 135–145)

## 2021-04-07 LAB — SEDIMENTATION RATE: Sed Rate: 14 mm/hr (ref 0–20)

## 2021-04-07 LAB — H. PYLORI ANTIBODY, IGG: H Pylori IgG: NEGATIVE

## 2021-04-07 LAB — HEPATIC FUNCTION PANEL
ALT: 116 U/L — ABNORMAL HIGH (ref 0–53)
AST: 71 U/L — ABNORMAL HIGH (ref 0–37)
Albumin: 4.3 g/dL (ref 3.5–5.2)
Alkaline Phosphatase: 46 U/L (ref 39–117)
Bilirubin, Direct: 0.1 mg/dL (ref 0.0–0.3)
Total Bilirubin: 0.6 mg/dL (ref 0.2–1.2)
Total Protein: 6.7 g/dL (ref 6.0–8.3)

## 2021-04-07 LAB — LIPASE: Lipase: 82 U/L — ABNORMAL HIGH (ref 11.0–59.0)

## 2021-04-07 LAB — TSH: TSH: 1.23 u[IU]/mL (ref 0.35–5.50)

## 2021-04-07 LAB — HEMOGLOBIN A1C: Hgb A1c MFr Bld: 5.9 % (ref 4.6–6.5)

## 2021-04-07 MED ORDER — PANTOPRAZOLE SODIUM 40 MG PO TBEC: 40.0000 mg | DELAYED_RELEASE_TABLET | Freq: Every day | ORAL | 3 refills | Status: DC

## 2021-04-07 MED ORDER — SUCRALFATE 1 G PO TABS: 1.0000 g | ORAL_TABLET | Freq: Four times a day (QID) | ORAL | 0 refills | Status: DC

## 2021-04-07 NOTE — Patient Instructions (Addendum)
You had the flu shot today  Please take all new medication as prescribed - the protonix for stomach acid, and carafate for gastritis  Please stop drinking alcohol as you can  Please continue all other medications as before, and refills have been done if requested.  Please have the pharmacy call with any other refills you may need.  Please continue your efforts at being more active, low cholesterol diet, and weight control.  You are otherwise up to date with prevention measures today.  Please keep your appointments with your specialists as you may have planned  You will be contacted regarding the referral for: gastroenterology, and abdomen ultrasound  Please go to the LAB at the blood drawing area for the tests to be done  You will be contacted by phone if any changes need to be made immediately.  Otherwise, you will receive a letter about your results with an explanation, but please check with MyChart first.  Please remember to sign up for MyChart if you have not done so, as this will be important to you in the future with finding out test results, communicating by private email, and scheduling acute appointments online when needed.  Please make an Appointment to return in 6 months, or sooner if needed

## 2021-04-07 NOTE — Progress Notes (Signed)
Patient ID: Chad [name], male   DOB: Apr 05, 1970, 51 y.o.   MRN: [id number]         Chief Complaint:: wellness exam and Office Visit (Liver check; constant abdominal pain mostly after eating) , hyperglycemia, hld, low vit d        HPI:  Chad [name] is a 51 y.o. male here for wellness exam; decliens covid booster, shingrix, colonoscopy o/w up to date except due for flu shot                        Also taking Vit D.  Pt states chronic ETOH intake 4-5 beers per day for many month, a bit less lately however due to 2 mo increased upper abd pain with nausea, no vomiting but a sort of "heaviness" dull now constant without radiation to the back, but worse ETOH, food and tends to get loose stools occasionally, as well feeing cold sometimes.   Pt denies fever, wt loss, night sweats, loss of appetite, or other constitutional symptoms  Pt denies chest pain, increased sob or doe, wheezing, orthopnea, PND, increased LE swelling, palpitations, dizziness or syncope.   Pt denies polydipsia, polyuria, or new neuro s/s.     Wt Readings from Last 3 Encounters:  04/07/21 167 lb 9.6 oz (76 kg)  10/19/20 166 lb (75.3 kg)  10/06/20 166 lb (75.3 kg)   BP Readings from Last 3 Encounters:  04/07/21 110/78  10/19/20 129/89  10/06/20 114/62   Immunization History  Administered Date(s) Administered   Influenza Split 12/19/2011   Influenza,inj,Quad PF,6+ Mos 03/23/2013, 12/31/2013, 04/06/2015, 01/11/2016, 12/20/2016, 12/12/2017, 12/09/2018, 04/07/2021   Influenza,inj,Quad PF,6-35 Mos 12/18/2018   Influenza,inj,quad, With Preservative 03/23/2013, 12/31/2013, 04/06/2015, 01/11/2016, 12/20/2016, 12/12/2017   PFIZER(Purple Top)SARS-COV-2 Vaccination 05/29/2019, 06/25/2019, 12/30/2019   Tdap 12/12/2017   There are no preventive care reminders to display for this patient.     Past Medical History:  Diagnosis Date   AICD (automatic cardioverter/defibrillator) present    Brugada syndrome    s/p ICD 12/07   DVT (deep venous  thrombosis) (HCC)    Dysrhythmia    Echocardiogram    Echo 10/19: EF 60-65, no RWMA, mild LAE, mild TR, PASP 33   History of Carotid Doppler ultrasound    Carotid US 10/19: normal   Hyperlipidemia    Presence of permanent cardiac pacemaker    Syncope    s/p ICD 12/07   Past Surgical History:  Procedure Laterality Date   BACK SURGERY     CARDIAC DEFIBRILLATOR PLACEMENT  02/2006   [employer] Jude Atlas   FINGER TENDON REPAIR Left    2nd digit   ICD GENERATOR CHANGE  03/14/2016   ICD GENERATOR REMOVAL AND INSERTION OF NEW ICD, WITH LEAD  REMOVAL EXTRACTION Hattie Perch 03/14/2016   ICD LEAD REMOVAL N/A 03/14/2016   Procedure: ICD GENERATOR REMOVAL AND INSERTION OF NEW ICD, WITH LEAD  REMOVAL EXTRACTION;  Surgeon: Marinus Maw, MD;  Location: [facility] OR;  Service: Cardiovascular;  Laterality: N/A;   LUMBAR DISC SURGERY  1997   TEE WITHOUT CARDIOVERSION N/A 03/14/2016   Procedure: TRANSESOPHAGEAL ECHOCARDIOGRAM (TEE);  Surgeon: Marinus Maw, MD;  Location: [facility] OR;  Service: Cardiovascular;  Laterality: N/A;   UPPER EXTREMITY VENOGRAPHY Bilateral 05/12/2016   Procedure: Central Venography;  Surgeon: Sherren Kerns, MD;  Location: [facility] INVASIVE CV LAB;  Service: Cardiovascular;  Laterality: Bilateral;    reports that he quit smoking about 10 years ago. His smoking use included  cigarettes. He has a 23.00 pack-year smoking history. He has never used smokeless tobacco. He reports current alcohol use of about 6.0 standard drinks per week. He reports that he does not use drugs. family history includes Diabetes in his mother; Heart disease in his mother; Hyperlipidemia in his mother. No Known Allergies Current Outpatient Medications on File Prior to Visit  Medication Sig Dispense Refill   aspirin EC 81 MG tablet Take 81 mg by mouth daily.     atorvastatin (LIPITOR) 20 MG tablet Take 1 tablet (20 mg total) by mouth daily. 90 tablet 2   Cholecalciferol 50 MCG (2000 UT) TABS 1 tab by mouth once daily 30 tablet 99    co-enzyme Q-10 50 MG capsule Take 50 mg by mouth daily.     milk thistle 175 MG tablet Take 525 mg by mouth daily.     Omega-3 Fatty Acids (FISH OIL) 1000 MG CAPS Take by mouth.     gabapentin (NEURONTIN) 100 MG capsule Take 1 capsule (100 mg total) by mouth 3 (three) times daily. (Patient not taking: Reported on 10/19/2020) 90 capsule 3   Ginkgo Biloba 120 MG TABS Take by mouth. (Patient not taking: Reported on 04/07/2021)     icosapent Ethyl (VASCEPA) 1 g capsule Take 2 capsules (2 g total) by mouth 2 (two) times daily. (Patient not taking: Reported on 04/07/2021) 120 capsule 11   meloxicam (MOBIC) 7.5 MG tablet Take 7.5 mg by mouth daily. (Patient not taking: Reported on 10/19/2020)     metoprolol tartrate (LOPRESSOR) 25 MG tablet Take 0.5 tablets (12.5 mg total) by mouth as needed (for increased heart rate and palpitations). (Patient not taking: Reported on 10/19/2020) 30 tablet 3   predniSONE (DELTASONE) 10 MG tablet 2 tabs by mouth per day for 5 days (Patient not taking: Reported on 10/19/2020) 18 tablet 0   traMADol (ULTRAM) 50 MG tablet Take 1 tablet (50 mg total) by mouth every 6 (six) hours as needed. (Patient not taking: Reported on 10/19/2020) 30 tablet 0   valACYclovir (VALTREX) 1000 MG tablet Take 1 tablet (1,000 mg total) by mouth 2 (two) times daily as needed. (Patient not taking: Reported on 10/19/2020) 14 tablet 5   No current facility-administered medications on file prior to visit.        ROS:  All others reviewed and negative.  Objective        PE:  BP 110/78 (BP Location: Right Arm, Patient Position: Sitting, Cuff Size: Normal)    Pulse 61    Ht 5\' 7"  (1.702 m)    Wt 167 lb 9.6 oz (76 kg)    SpO2 98%    BMI 26.25 kg/m                 Constitutional: Pt appears in NAD               HENT: Head: NCAT.                Right Ear: External ear normal.                 Left Ear: External ear normal.                Eyes: . Pupils are equal, round, and reactive to light. Conjunctivae and EOM  are normal               Nose: without d/c or deformity  Neck: Neck supple. Gross normal ROM               Cardiovascular: Normal rate and regular rhythm.                 Pulmonary/Chest: Effort normal and breath sounds without rales or wheezing.                Abd:  Soft, mild mid epigastric tender, ND, + BS, no organomegaly               Neurological: Pt is alert. At baseline orientation, motor grossly intact               Skin: Skin is warm. No rashes, no other new lesions, LE edema - none               Psychiatric: Pt behavior is normal without agitation   Micro: none  Cardiac tracings I have personally interpreted today:  none  Pertinent Radiological findings (summarize): none   Lab Results  Component Value Date   WBC 6.0 04/07/2021   HGB 13.8 04/07/2021   HCT 42.4 04/07/2021   PLT 297.0 04/07/2021   GLUCOSE 96 04/07/2021   CHOL 152 04/07/2021   TRIG 169.0 (H) 04/07/2021   HDL 44.90 04/07/2021   LDLDIRECT 72.0 03/20/2018   LDLCALC 74 04/07/2021   ALT 116 (H) 04/07/2021   AST 71 (H) 04/07/2021   NA 140 04/07/2021   K 4.4 04/07/2021   CL 104 04/07/2021   CREATININE 0.82 04/07/2021   BUN 9 04/07/2021   CO2 29 04/07/2021   TSH 1.23 04/07/2021   PSA 0.39 04/07/2021   HGBA1C 5.9 04/07/2021   Assessment/Plan:  Chad [name] is a 51 y.o. Asian [4] male with  has a past medical history of AICD (automatic cardioverter/defibrillator) present, Brugada syndrome, DVT (deep venous thrombosis) (HCC), Dysrhythmia, Echocardiogram, History of Carotid Doppler ultrasound, Hyperlipidemia, Presence of permanent cardiac pacemaker, and Syncope.  Encounter for well adult exam with abnormal findings Age and sex appropriate education and counseling updated with regular exercise and diet Referrals for preventative services - declines colonoscopy for now Immunizations addressed - declines covid booster and shingrix, but for flu shot today Smoking counseling  - none needed Evidence for  depression or other mood disorder - none significant Most recent labs reviewed. I have personally reviewed and have noted: 1) the patient's medical and social history 2) The patient's current medications and supplements 3) The patient's height, weight, and BMI have been recorded in the chart   Alcohol use Pt encouraged to abstain  Hyperglycemia Lab Results  Component Value Date   HGBA1C 5.9 04/07/2021   Stable, pt to continue current medical treatment  - diet   Hyperlipidemia Lab Results  Component Value Date   LDLCALC 74 04/07/2021   Mild uncontrolled, goal ldl < 70, pt to continue current statin lipitor, vascepa as declines change for now, for lower chol diet   Low vitamin D level Last vitamin D Lab Results  Component Value Date   VD25OH 42.88 04/07/2021   Stable, cont oral replacement   Epigastric pain C/w liekly ETOH related gastritis, for start protonix 40, carafate qid x 1 mo, abdomen u/s r/o GB, as well as lab today including cbc, lfts and lipase, and refer GI  Followup: No follow-ups on file.  Oliver Barre, MD 04/09/2021 7:24 AM [facility]

## 2021-04-09 DIAGNOSIS — R1013 Epigastric pain: Secondary | ICD-10-CM | POA: Insufficient documentation

## 2021-04-09 NOTE — Assessment & Plan Note (Signed)
Lab Results  Component Value Date   LDLCALC 74 04/07/2021   Mild uncontrolled, goal ldl < 70, pt to continue current statin lipitor, vascepa as declines change for now, for lower chol diet

## 2021-04-09 NOTE — Assessment & Plan Note (Signed)
Last vitamin D Lab Results  Component Value Date   VD25OH 42.88 04/07/2021   Stable, cont oral replacement

## 2021-04-09 NOTE — Assessment & Plan Note (Signed)
C/w liekly ETOH related gastritis, for start protonix 40, carafate qid x 1 mo, abdomen u/s r/o GB, as well as lab today including cbc, lfts and lipase, and refer GI

## 2021-04-09 NOTE — Assessment & Plan Note (Signed)
Lab Results  Component Value Date   HGBA1C 5.9 04/07/2021   Stable, pt to continue current medical treatment  - diet

## 2021-04-09 NOTE — Assessment & Plan Note (Signed)
Age and sex appropriate education and counseling updated with regular exercise and diet Referrals for preventative services - declines colonoscopy for now Immunizations addressed - declines covid booster and shingrix, but for flu shot today Smoking counseling  - none needed Evidence for depression or other mood disorder - none significant Most recent labs reviewed. I have personally reviewed and have noted: 1) the patient's medical and social history 2) The patient's current medications and supplements 3) The patient's height, weight, and BMI have been recorded in the chart

## 2021-04-09 NOTE — Assessment & Plan Note (Signed)
Pt encouraged to abstain

## 2021-04-12 NOTE — Telephone Encounter (Signed)
NOTE NOT NEED 

## 2021-04-20 ENCOUNTER — Ambulatory Visit
Admission: RE | Admit: 2021-04-20 | Discharge: 2021-04-20 | Disposition: A | Discharge status: Home / Self Care | Payer: 59 | Source: Ambulatory Visit | Attending: Internal Medicine | Admitting: Internal Medicine

## 2021-04-20 DIAGNOSIS — R1013 Epigastric pain: Secondary | ICD-10-CM

## 2021-04-21 ENCOUNTER — Encounter: Payer: Self-pay | Admitting: Internal Medicine

## 2021-04-21 DIAGNOSIS — N200 Calculus of kidney: Secondary | ICD-10-CM | POA: Insufficient documentation

## 2021-04-28 ENCOUNTER — Telehealth: Payer: Self-pay | Admitting: Internal Medicine

## 2021-04-28 NOTE — Telephone Encounter (Signed)
[facility] cancelled patients appointment.  [facility] is not in network with [facility].  GI left voice message on patients machine letting patient know that appointment had been cancelled.

## 2021-04-28 NOTE — Telephone Encounter (Signed)
Ok I will forward to PCCs 

## 2021-04-28 NOTE — Telephone Encounter (Signed)
Pt has called, requesting that referral be sent somewhere else that takes his insurance. Requesting that it be sent ASAP.   Please advise.

## 2021-04-29 ENCOUNTER — Encounter: Payer: Self-pay | Admitting: Internal Medicine

## 2021-04-29 ENCOUNTER — Other Ambulatory Visit: Payer: Self-pay

## 2021-04-29 ENCOUNTER — Ambulatory Visit (INDEPENDENT_AMBULATORY_CARE_PROVIDER_SITE_OTHER): Payer: 59 | Admitting: Internal Medicine

## 2021-04-29 VITALS — BP 118/78 | HR 85 | Temp 98.6°F | Ht 67.0 in | Wt 171.1 lb

## 2021-04-29 DIAGNOSIS — R7989 Other specified abnormal findings of blood chemistry: Secondary | ICD-10-CM | POA: Insufficient documentation

## 2021-04-29 DIAGNOSIS — N529 Male erectile dysfunction, unspecified: Secondary | ICD-10-CM

## 2021-04-29 DIAGNOSIS — K148 Other diseases of tongue: Secondary | ICD-10-CM

## 2021-04-29 DIAGNOSIS — E782 Mixed hyperlipidemia: Secondary | ICD-10-CM | POA: Diagnosis not present

## 2021-04-29 MED ORDER — ICOSAPENT ETHYL 1 G PO CAPS: 2.0000 g | ORAL_CAPSULE | Freq: Two times a day (BID) | ORAL | 11 refills | Status: DC

## 2021-04-29 MED ORDER — TADALAFIL 20 MG PO TABS
10.0000 mg | ORAL_TABLET | ORAL | 11 refills | Status: DC | PRN
Start: 2021-04-29 — End: 2022-08-06

## 2021-04-29 NOTE — Assessment & Plan Note (Signed)
New worsening, for cialis prn,  to f/u any worsening symptoms or concerns

## 2021-04-29 NOTE — Patient Instructions (Signed)
Please take all new medication as prescribed - the vascepa, and the cialis as needed  Please continue all other medications as before, and refills have been done if requested.  Please have the pharmacy call with any other refills you may need.  Please keep your appointments with your specialists as you may have planned - the CT scan   You will be contacted regarding the referral for: Oral Surgury for the tongue

## 2021-04-29 NOTE — Assessment & Plan Note (Signed)
With elevated Trigs - ok for change lovaza to vascepa, cont diet, for lab next visit

## 2021-04-29 NOTE — Assessment & Plan Note (Signed)
Posts bite trauma, but needs attention and removal - for oral surgury referral

## 2021-04-29 NOTE — Progress Notes (Signed)
Patient ID: Chad [name], male   DOB: 1970-08-31, 51 y.o.   MRN: [id number]        Chief Complaint: follow up tongue bite, Ed, elevated LFTs, elevated TG's       HPI:  Chad [name] is a 51 y.o. male here with c/o 3 days onset bite to left mid tongue with bleeding and pain now improved but with flap tongue tissure remaining, no worsening pain or swelling or drainage.  Also with worsening ED syptoms x 2 mo, does not want viagra but for some reason is ok with cialis trial.  Denies worsening reflux, abd pain, dysphagia, n/v, bowel change or blood. Has CT planned for mar 1.  Also lovaza no longer covered by insurance, but asks for change to vascepa which is.  Trying to follow lower chol /fat diet.        Wt Readings from Last 3 Encounters:  04/29/21 171 lb 2 oz (77.6 kg)  04/07/21 167 lb 9.6 oz (76 kg)  10/19/20 166 lb (75.3 kg)   BP Readings from Last 3 Encounters:  04/29/21 118/78  04/07/21 110/78  10/19/20 129/89         Past Medical History:  Diagnosis Date   AICD (automatic cardioverter/defibrillator) present    Brugada syndrome    s/p ICD 12/07   DVT (deep venous thrombosis) (HCC)    Dysrhythmia    Echocardiogram    Echo 10/19: EF 60-65, no RWMA, mild LAE, mild TR, PASP 33   History of Carotid Doppler ultrasound    Carotid US 10/19: normal   Hyperlipidemia    Presence of permanent cardiac pacemaker    Syncope    s/p ICD 12/07   Past Surgical History:  Procedure Laterality Date   BACK SURGERY     CARDIAC DEFIBRILLATOR PLACEMENT  02/2006   [employer]   FINGER TENDON REPAIR Left    2nd digit   ICD GENERATOR CHANGE  03/14/2016   ICD GENERATOR REMOVAL AND INSERTION OF NEW ICD, WITH LEAD  REMOVAL EXTRACTION Hattie Perch 03/14/2016   ICD LEAD REMOVAL N/A 03/14/2016   Procedure: ICD GENERATOR REMOVAL AND INSERTION OF NEW ICD, WITH LEAD  REMOVAL EXTRACTION;  Surgeon: Marinus Maw, MD;  Location: [facility] OR;  Service: Cardiovascular;  Laterality: N/A;   LUMBAR DISC SURGERY  1997   TEE WITHOUT  CARDIOVERSION N/A 03/14/2016   Procedure: TRANSESOPHAGEAL ECHOCARDIOGRAM (TEE);  Surgeon: Marinus Maw, MD;  Location: [facility] OR;  Service: Cardiovascular;  Laterality: N/A;   UPPER EXTREMITY VENOGRAPHY Bilateral 05/12/2016   Procedure: Central Venography;  Surgeon: Sherren Kerns, MD;  Location: [facility] INVASIVE CV LAB;  Service: Cardiovascular;  Laterality: Bilateral;    reports that he quit smoking about 10 years ago. His smoking use included cigarettes. He has a 23.00 pack-year smoking history. He has never used smokeless tobacco. He reports current alcohol use of about 6.0 standard drinks per week. He reports that he does not use drugs. family history includes Diabetes in his mother; Heart disease in his mother; Hyperlipidemia in his mother. No Known Allergies Current Outpatient Medications on File Prior to Visit  Medication Sig Dispense Refill   aspirin EC 81 MG tablet Take 81 mg by mouth daily.     atorvastatin (LIPITOR) 20 MG tablet Take 1 tablet (20 mg total) by mouth daily. 90 tablet 2   Cholecalciferol 50 MCG (2000 UT) TABS 1 tab by mouth once daily 30 tablet 99   co-enzyme Q-10 50 MG capsule Take 50  mg by mouth daily.     gabapentin (NEURONTIN) 100 MG capsule Take 1 capsule (100 mg total) by mouth 3 (three) times daily. 90 capsule 3   Ginkgo Biloba 120 MG TABS Take by mouth.     meloxicam (MOBIC) 7.5 MG tablet Take 7.5 mg by mouth daily.     milk thistle 175 MG tablet Take 525 mg by mouth daily.     Omega-3 Fatty Acids (FISH OIL) 1000 MG CAPS Take by mouth.     pantoprazole (PROTONIX) 40 MG tablet Take 1 tablet (40 mg total) by mouth daily. 90 tablet 3   predniSONE (DELTASONE) 10 MG tablet 2 tabs by mouth per day for 5 days 18 tablet 0   sucralfate (CARAFATE) 1 g tablet Take 1 tablet (1 g total) by mouth 4 (four) times daily. 120 tablet 0   traMADol (ULTRAM) 50 MG tablet Take 1 tablet (50 mg total) by mouth every 6 (six) hours as needed. 30 tablet 0   valACYclovir (VALTREX) 1000 MG tablet  Take 1 tablet (1,000 mg total) by mouth 2 (two) times daily as needed. 14 tablet 5   metoprolol tartrate (LOPRESSOR) 25 MG tablet Take 0.5 tablets (12.5 mg total) by mouth as needed (for increased heart rate and palpitations). (Patient not taking: Reported on 10/19/2020) 30 tablet 3   No current facility-administered medications on file prior to visit.        ROS:  All others reviewed and negative.  Objective        PE:  BP 118/78    Pulse 85    Temp 98.6 F (37 C) (Oral)    Ht 5\' 7"  (1.702 m)    Wt 171 lb 2 oz (77.6 kg)    SpO2 94%    BMI 26.80 kg/m                 Constitutional: Pt appears in NAD               HENT: Head: NCAT.                Right Ear: External ear normal.                 Left Ear: External ear normal.                Eyes: . Pupils are equal, round, and reactive to light. Conjunctivae and EOM are normal               Nose: without d/c or deformity; tongue with fleshy flap post bite remaining approx 15 mm without bleding, swelling or drainage               Neck: Neck supple. Gross normal ROM               Cardiovascular: Normal rate and regular rhythm.                 Pulmonary/Chest: Effort normal and breath sounds without rales or wheezing.                Abd:  Soft, NT, ND, + BS, no organomegaly               Neurological: Pt is alert. At baseline orientation, motor grossly intact               Skin: Skin is warm. No rashes, no other new lesions, LE edema - none  Psychiatric: Pt behavior is normal without agitation   Micro: none  Cardiac tracings I have personally interpreted today:  none  Pertinent Radiological findings (summarize): none   Lab Results  Component Value Date   WBC 6.0 04/07/2021   HGB 13.8 04/07/2021   HCT 42.4 04/07/2021   PLT 297.0 04/07/2021   GLUCOSE 96 04/07/2021   CHOL 152 04/07/2021   TRIG 169.0 (H) 04/07/2021   HDL 44.90 04/07/2021   LDLDIRECT 72.0 03/20/2018   LDLCALC 74 04/07/2021   ALT 116 (H) 04/07/2021   AST  71 (H) 04/07/2021   NA 140 04/07/2021   K 4.4 04/07/2021   CL 104 04/07/2021   CREATININE 0.82 04/07/2021   BUN 9 04/07/2021   CO2 29 04/07/2021   TSH 1.23 04/07/2021   PSA 0.39 04/07/2021   HGBA1C 5.9 04/07/2021   Assessment/Plan:  Chad [name] is a 51 y.o. Asian [4] male with  has a past medical history of AICD (automatic cardioverter/defibrillator) present, Brugada syndrome, DVT (deep venous thrombosis) (HCC), Dysrhythmia, Echocardiogram, History of Carotid Doppler ultrasound, Hyperlipidemia, Presence of permanent cardiac pacemaker, and Syncope.  Tongue lesion Posts bite trauma, but needs attention and removal - for oral surgury referral  Erectile dysfunction New worsening, for cialis prn,  to f/u any worsening symptoms or concerns  Abnormal LFTs Most recent repeat lft's improved, for CT abd/pelvis, consdier GI referral  Hyperlipidemia With elevated Trigs - ok for change lovaza to vascepa, cont diet, for lab next visit  Followup: Return if symptoms worsen or fail to improve.  Oliver Barre, MD 04/29/2021 7:47 PM [facility] Internal Medicine

## 2021-04-29 NOTE — Assessment & Plan Note (Signed)
Most recent repeat lft's improved, for CT abd/pelvis, consdier GI referral

## 2021-05-04 ENCOUNTER — Ambulatory Visit: Payer: 59 | Admitting: Internal Medicine

## 2021-05-11 ENCOUNTER — Other Ambulatory Visit: Payer: 59

## 2021-05-26 ENCOUNTER — Ambulatory Visit (HOSPITAL_COMMUNITY)
Admission: RE | Admit: 2021-05-26 | Discharge: 2021-05-26 | Disposition: A | Discharge status: Home / Self Care | Payer: 59 | Source: Ambulatory Visit | Attending: Internal Medicine | Admitting: Internal Medicine

## 2021-05-26 ENCOUNTER — Other Ambulatory Visit: Payer: Self-pay

## 2021-05-26 DIAGNOSIS — R1084 Generalized abdominal pain: Secondary | ICD-10-CM | POA: Diagnosis not present

## 2021-05-26 MED ORDER — SODIUM CHLORIDE (PF) 0.9 % IJ SOLN
INTRAMUSCULAR | Status: AC
  Filled 2021-05-26: qty 50

## 2021-05-26 MED ORDER — IOHEXOL 300 MG/ML  SOLN
100.0000 mL | Freq: Once | INTRAMUSCULAR | Status: AC | PRN
  Administered 2021-05-26: 100 mL via INTRAVENOUS

## 2021-05-28 ENCOUNTER — Encounter: Payer: Self-pay | Admitting: Internal Medicine

## 2021-05-28 ENCOUNTER — Other Ambulatory Visit: Payer: Self-pay | Admitting: Internal Medicine

## 2021-05-28 MED ORDER — CIPROFLOXACIN HCL 500 MG PO TABS: 500.0000 mg | ORAL_TABLET | Freq: Two times a day (BID) | ORAL | 0 refills | Status: AC

## 2021-05-28 MED ORDER — METRONIDAZOLE 500 MG PO TABS: 500.0000 mg | ORAL_TABLET | Freq: Three times a day (TID) | ORAL | 0 refills | Status: AC

## 2021-05-30 ENCOUNTER — Telehealth: Payer: Self-pay | Admitting: Internal Medicine

## 2021-05-30 NOTE — Telephone Encounter (Signed)
PT calls today in regards to tests results from visit last week! ? ?CB: [phone] ?

## 2021-06-01 ENCOUNTER — Ambulatory Visit (INDEPENDENT_AMBULATORY_CARE_PROVIDER_SITE_OTHER): Payer: 59

## 2021-06-01 DIAGNOSIS — R55 Syncope and collapse: Secondary | ICD-10-CM

## 2021-06-01 DIAGNOSIS — I429 Cardiomyopathy, unspecified: Secondary | ICD-10-CM

## 2021-06-03 LAB — CUP PACEART REMOTE DEVICE CHECK
Battery Remaining Longevity: 59 mo
Battery Remaining Percentage: 58 %
Battery Voltage: 2.95 V
Brady Statistic RV Percent Paced: 1 %
Date Time Interrogation Session: 20230324002803
HighPow Impedance: 73 Ohm
HighPow Impedance: 73 Ohm
Implantable Lead Implant Date: 20180102
Implantable Lead Location: 753860
Implantable Lead Model: 7122
Implantable Pulse Generator Implant Date: 20180102
Lead Channel Impedance Value: 450 Ohm
Lead Channel Pacing Threshold Amplitude: 1.25 V
Lead Channel Pacing Threshold Pulse Width: 0.6 ms
Lead Channel Sensing Intrinsic Amplitude: 7.3 mV
Lead Channel Setting Pacing Amplitude: 2.5 V
Lead Channel Setting Pacing Pulse Width: 0.6 ms
Lead Channel Setting Sensing Sensitivity: 0.5 mV
Pulse Gen Serial Number: 1246759

## 2021-06-10 ENCOUNTER — Telehealth: Payer: Self-pay

## 2021-06-10 NOTE — Telephone Encounter (Signed)
Pt SO calling stating that the pt isn't feeling better after completing the medication prescribed by Dr. Jenny Reichmann. She then said in fact I think he is getting worse. Pt stomach is swelling like bloated or something.  ? ?I advised the her that he should report to ER with the swelling and discomfort. She then says I know we should go to the ER for an emergency. Pt refused the advice and made an appt for 06/15/21. ? ?FYI ? ? ?

## 2021-06-15 ENCOUNTER — Ambulatory Visit (INDEPENDENT_AMBULATORY_CARE_PROVIDER_SITE_OTHER): Payer: 59 | Admitting: Internal Medicine

## 2021-06-15 ENCOUNTER — Encounter: Payer: Self-pay | Admitting: Internal Medicine

## 2021-06-15 VITALS — BP 110/62 | HR 62 | Temp 98.2°F | Ht 67.0 in | Wt 166.0 lb

## 2021-06-15 DIAGNOSIS — R7989 Other specified abnormal findings of blood chemistry: Secondary | ICD-10-CM | POA: Diagnosis not present

## 2021-06-15 DIAGNOSIS — Z0001 Encounter for general adult medical examination with abnormal findings: Secondary | ICD-10-CM

## 2021-06-15 DIAGNOSIS — R739 Hyperglycemia, unspecified: Secondary | ICD-10-CM

## 2021-06-15 DIAGNOSIS — K921 Melena: Secondary | ICD-10-CM

## 2021-06-15 DIAGNOSIS — E559 Vitamin D deficiency, unspecified: Secondary | ICD-10-CM

## 2021-06-15 DIAGNOSIS — E782 Mixed hyperlipidemia: Secondary | ICD-10-CM | POA: Diagnosis not present

## 2021-06-15 DIAGNOSIS — R1030 Lower abdominal pain, unspecified: Secondary | ICD-10-CM

## 2021-06-15 DIAGNOSIS — R197 Diarrhea, unspecified: Secondary | ICD-10-CM

## 2021-06-15 DIAGNOSIS — N529 Male erectile dysfunction, unspecified: Secondary | ICD-10-CM | POA: Diagnosis not present

## 2021-06-15 DIAGNOSIS — E538 Deficiency of other specified B group vitamins: Secondary | ICD-10-CM

## 2021-06-15 LAB — CBC WITH DIFFERENTIAL/PLATELET
Basophils Absolute: 0 10*3/uL (ref 0.0–0.1)
Basophils Relative: 0.6 % (ref 0.0–3.0)
Eosinophils Absolute: 0.1 10*3/uL (ref 0.0–0.7)
Eosinophils Relative: 2.6 % (ref 0.0–5.0)
HCT: 43.3 % (ref 39.0–52.0)
Hemoglobin: 14.6 g/dL (ref 13.0–17.0)
Lymphocytes Relative: 31.7 % (ref 12.0–46.0)
Lymphs Abs: 1.8 10*3/uL (ref 0.7–4.0)
MCHC: 33.8 g/dL (ref 30.0–36.0)
MCV: 94.1 fl (ref 78.0–100.0)
Monocytes Absolute: 0.4 10*3/uL (ref 0.1–1.0)
Monocytes Relative: 6.5 % (ref 3.0–12.0)
Neutro Abs: 3.3 10*3/uL (ref 1.4–7.7)
Neutrophils Relative %: 58.6 % (ref 43.0–77.0)
Platelets: 292 10*3/uL (ref 150.0–400.0)
RBC: 4.61 Mil/uL (ref 4.22–5.81)
RDW: 12.3 % (ref 11.5–15.5)
WBC: 5.6 10*3/uL (ref 4.0–10.5)

## 2021-06-15 LAB — BASIC METABOLIC PANEL
BUN: 15 mg/dL (ref 6–23)
CO2: 31 mEq/L (ref 19–32)
Calcium: 10 mg/dL (ref 8.4–10.5)
Chloride: 101 mEq/L (ref 96–112)
Creatinine, Ser: 0.77 mg/dL (ref 0.40–1.50)
GFR: 104.25 mL/min (ref >60.00)
Glucose, Bld: 91 mg/dL (ref 70–99)
Potassium: 4.2 mEq/L (ref 3.5–5.1)
Sodium: 138 mEq/L (ref 135–145)

## 2021-06-15 LAB — HEPATIC FUNCTION PANEL
ALT: 186 U/L — ABNORMAL HIGH (ref 0–53)
AST: 166 U/L — ABNORMAL HIGH (ref 0–37)
Albumin: 4.6 g/dL (ref 3.5–5.2)
Alkaline Phosphatase: 52 U/L (ref 39–117)
Bilirubin, Direct: 0.1 mg/dL (ref 0.0–0.3)
Total Bilirubin: 0.7 mg/dL (ref 0.2–1.2)
Total Protein: 7 g/dL (ref 6.0–8.3)

## 2021-06-15 LAB — TSH: TSH: 1.28 u[IU]/mL (ref 0.35–5.50)

## 2021-06-15 LAB — URINALYSIS, ROUTINE W REFLEX MICROSCOPIC
Bilirubin Urine: NEGATIVE
Hgb urine dipstick: NEGATIVE
Ketones, ur: NEGATIVE
Leukocytes,Ua: NEGATIVE
Nitrite: NEGATIVE
RBC / HPF: NONE SEEN (ref 0–?)
Specific Gravity, Urine: 1.02 (ref 1.000–1.030)
Total Protein, Urine: NEGATIVE
Urine Glucose: NEGATIVE
Urobilinogen, UA: 0.2 (ref 0.0–1.0)
pH: 6 (ref 5.0–8.0)

## 2021-06-15 LAB — VITAMIN D 25 HYDROXY (VIT D DEFICIENCY, FRACTURES): VITD: 27.73 ng/mL — ABNORMAL LOW (ref 30.00–100.00)

## 2021-06-15 LAB — LIPID PANEL
Cholesterol: 204 mg/dL — ABNORMAL HIGH (ref 0–200)
HDL: 48 mg/dL (ref >39.00)
LDL Cholesterol: 123 mg/dL — ABNORMAL HIGH (ref 0–99)
NonHDL: 156.24
Total CHOL/HDL Ratio: 4
Triglycerides: 167 mg/dL — ABNORMAL HIGH (ref 0.0–149.0)
VLDL: 33.4 mg/dL (ref 0.0–40.0)

## 2021-06-15 LAB — HEMOGLOBIN A1C: Hgb A1c MFr Bld: 5.9 % (ref 4.6–6.5)

## 2021-06-15 LAB — VITAMIN B12: Vitamin B-12: 360 pg/mL (ref 211–911)

## 2021-06-15 LAB — PSA: PSA: 0.27 ng/mL (ref 0.10–4.00)

## 2021-06-15 MED ORDER — AMOXICILLIN-POT CLAVULANATE 875-125 MG PO TABS
1.0000 | ORAL_TABLET | Freq: Two times a day (BID) | ORAL | 0 refills | Status: DC
Start: 2021-06-15 — End: 2021-08-24

## 2021-06-15 MED ORDER — SILDENAFIL CITRATE 100 MG PO TABS: 50.0000 mg | ORAL_TABLET | Freq: Every day | ORAL | 11 refills | Status: DC | PRN

## 2021-06-15 NOTE — Patient Instructions (Signed)
Please take all new medication as prescribed - the antibiotic ? ?Please continue all other medications as before, and refills have been done if requested. ? ?Please have the pharmacy call with any other refills you may need. ? ?Please continue your efforts at being more active, low cholesterol diet, and weight control. ? ?You are otherwise up to date with prevention measures today. ? ?Please keep your appointments with your specialists as you may have planned ? ?You will be contacted regarding the referral for: GI - (urgent) ? ?Please go to the LAB at the blood drawing area for the tests to be done ? ?You will be contacted by phone if any changes need to be made immediately.  Otherwise, you will receive a letter about your results with an explanation, but please check with MyChart first. ? ?Please remember to sign up for MyChart if you have not done so, as this will be important to you in the future with finding out test results, communicating by private email, and scheduling acute appointments online when needed. ? ?Please make an Appointment to return in 6 months, or sooner if needed ?

## 2021-06-15 NOTE — Progress Notes (Signed)
Remote ICD transmission.   

## 2021-06-15 NOTE — Progress Notes (Signed)
Patient ID: Catarino Vold, male   DOB: 1971/01/05, 51 y.o.   MRN: [id number] ? ? ? ?    Chief Complaint: follow up recent colitis, now with worsening left sided abd pain and hematochezia x 3 days, and ED ? ?     HPI:  Daundre Biel is a 51 y.o. male here with c/o recent colitis by CT which seemed to improved symptomatically with cipro/flagyl mar 16, and did ok until now 3 days onset left sided pain,, gas feeling and bloating with nausea (no vomiting) and small volume BRBPR several times with loose diarrhea stools.  Pt denies chest pain, increased sob or doe, wheezing, orthopnea, PND, increased LE swelling, palpitations, dizziness or syncope.   Pt denies polydipsia, polyuria, or new focal neuro s/s.  Pt denies fever, wt loss, night sweats, loss of appetite, or other constitutional symptoms  ?      ?Wt Readings from Last 3 Encounters:  ?06/15/21 166 lb (75.3 kg)  ?04/29/21 171 lb 2 oz (77.6 kg)  ?04/07/21 167 lb 9.6 oz (76 kg)  ? ?BP Readings from Last 3 Encounters:  ?06/15/21 110/62  ?04/29/21 118/78  ?04/07/21 110/78  ? ?      ?Past Medical History:  ?Diagnosis Date  ? AICD (automatic cardioverter/defibrillator) present   ? Brugada syndrome   ? s/p ICD 12/07  ? DVT (deep venous thrombosis) (HCC)   ? Dysrhythmia   ? Echocardiogram   ? Echo 10/19: EF 60-65, no RWMA, mild LAE, mild TR, PASP 33  ? History of Carotid Doppler ultrasound   ? Carotid US 10/19: normal  ? Hyperlipidemia   ? Presence of permanent cardiac pacemaker   ? Syncope   ? s/p ICD 12/07  ? ?Past Surgical History:  ?Procedure Laterality Date  ? BACK SURGERY    ? CARDIAC DEFIBRILLATOR PLACEMENT  02/2006  ? [employer] Atlas  ? FINGER TENDON REPAIR Left   ? 2nd digit  ? ICD GENERATOR CHANGE  03/14/2016  ? ICD GENERATOR REMOVAL AND INSERTION OF NEW ICD, WITH LEAD  REMOVAL EXTRACTION Hattie Perch 03/14/2016  ? ICD LEAD REMOVAL N/A 03/14/2016  ? Procedure: ICD GENERATOR REMOVAL AND INSERTION OF NEW ICD, WITH LEAD  REMOVAL EXTRACTION;  Surgeon: Marinus Maw, MD;  Location: [facility] OR;   Service: Cardiovascular;  Laterality: N/A;  ? LUMBAR DISC SURGERY  1997  ? TEE WITHOUT CARDIOVERSION N/A 03/14/2016  ? Procedure: TRANSESOPHAGEAL ECHOCARDIOGRAM (TEE);  Surgeon: Marinus Maw, MD;  Location: [facility] OR;  Service: Cardiovascular;  Laterality: N/A;  ? UPPER EXTREMITY VENOGRAPHY Bilateral 05/12/2016  ? Procedure: Central Venography;  Surgeon: Sherren Kerns, MD;  Location: [facility] INVASIVE CV LAB;  Service: Cardiovascular;  Laterality: Bilateral;  ? ? reports that he quit smoking about 10 years ago. His smoking use included cigarettes. He has a 23.00 pack-year smoking history. He has never used smokeless tobacco. He reports current alcohol use of about 6.0 standard drinks per week. He reports that he does not use drugs. ?family history includes Diabetes in his mother; Heart disease in his mother; Hyperlipidemia in his mother. ?No Known Allergies ?Current Outpatient Medications on File Prior to Visit  ?Medication Sig Dispense Refill  ? aspirin EC 81 MG tablet Take 81 mg by mouth daily.    ? atorvastatin (LIPITOR) 20 MG tablet Take 1 tablet (20 mg total) by mouth daily. 90 tablet 2  ? Cholecalciferol 50 MCG (2000 UT) TABS 1 tab by mouth once daily 30 tablet 99  ? co-enzyme Q-10  50 MG capsule Take 50 mg by mouth daily.    ? gabapentin (NEURONTIN) 100 MG capsule Take 1 capsule (100 mg total) by mouth 3 (three) times daily. 90 capsule 3  ? Ginkgo Biloba 120 MG TABS Take by mouth.    ? icosapent Ethyl (VASCEPA) 1 g capsule Take 2 capsules (2 g total) by mouth 2 (two) times daily. 120 capsule 11  ? meloxicam (MOBIC) 7.5 MG tablet Take 7.5 mg by mouth daily.    ? milk thistle 175 MG tablet Take 525 mg by mouth daily.    ? Omega-3 Fatty Acids (FISH OIL) 1000 MG CAPS Take by mouth.    ? pantoprazole (PROTONIX) 40 MG tablet Take 1 tablet (40 mg total) by mouth daily. 90 tablet 3  ? predniSONE (DELTASONE) 10 MG tablet 2 tabs by mouth per day for 5 days 18 tablet 0  ? sucralfate (CARAFATE) 1 g tablet Take 1 tablet (1 g total)  by mouth 4 (four) times daily. 120 tablet 0  ? tadalafil (CIALIS) 20 MG tablet Take 0.5-1 tablets (10-20 mg total) by mouth every other day as needed for erectile dysfunction. 10 tablet 11  ? traMADol (ULTRAM) 50 MG tablet Take 1 tablet (50 mg total) by mouth every 6 (six) hours as needed. 30 tablet 0  ? valACYclovir (VALTREX) 1000 MG tablet Take 1 tablet (1,000 mg total) by mouth 2 (two) times daily as needed. 14 tablet 5  ? metoprolol tartrate (LOPRESSOR) 25 MG tablet Take 0.5 tablets (12.5 mg total) by mouth as needed (for increased heart rate and palpitations). (Patient not taking: Reported on 10/19/2020) 30 tablet 3  ? ?No current facility-administered medications on file prior to visit.  ? ?     ROS:  All others reviewed and negative. ? ?Objective  ? ?     PE:  BP 110/62 (BP Location: Right Arm, Patient Position: Sitting, Cuff Size: Normal)   Pulse 62   Temp 98.2 ?F (36.8 ?C) (Oral)   Ht 5\' 7"  (1.702 m)   Wt 166 lb (75.3 kg)   SpO2 98%   BMI 26.00 kg/m?  ? ?              Constitutional: Pt appears in NAD ?              HENT: Head: NCAT.  ?              Right Ear: External ear normal.   ?              Left Ear: External ear normal.  ?              Eyes: . Pupils are equal, round, and reactive to light. Conjunctivae and EOM are normal ?              Nose: without d/c or deformity ?              Neck: Neck supple. Gross normal ROM ?              Cardiovascular: Normal rate and regular rhythm.   ?              Pulmonary/Chest: Effort normal and breath sounds without rales or wheezing.  ?              Abd:  Soft, mild LLQ tender without guarding or rebound, ND, + BS, no organomegaly ?  Neurological: Pt is alert. At baseline orientation, motor grossly intact ?              Skin: Skin is warm. No rashes, no other new lesions, LE edema - none ?              Psychiatric: Pt behavior is normal without agitation  ? ?Micro: none ? ?Cardiac tracings I have personally interpreted today:  none ? ?Pertinent  Radiological findings (summarize): none  ? ?Lab Results  ?Component Value Date  ? WBC 5.6 06/15/2021  ? HGB 14.6 06/15/2021  ? HCT 43.3 06/15/2021  ? PLT 292.0 06/15/2021  ? GLUCOSE 91 06/15/2021  ? CHOL 204 (H) 06/15/2021  ? TRIG 167.0 (H) 06/15/2021  ? HDL 48.00 06/15/2021  ? LDLDIRECT 72.0 03/20/2018  ? LDLCALC 123 (H) 06/15/2021  ? ALT 186 (H) 06/15/2021  ? AST 166 (H) 06/15/2021  ? NA 138 06/15/2021  ? K 4.2 06/15/2021  ? CL 101 06/15/2021  ? CREATININE 0.77 06/15/2021  ? BUN 15 06/15/2021  ? CO2 31 06/15/2021  ? TSH 1.28 06/15/2021  ? PSA 0.27 06/15/2021  ? HGBA1C 5.9 06/15/2021  ? ?Assessment/Plan:  ?Sky Borboa is a 51 y.o. Asian [4] male with  has a past medical history of AICD (automatic cardioverter/defibrillator) present, Brugada syndrome, DVT (deep venous thrombosis) (HCC), Dysrhythmia, Echocardiogram, History of Carotid Doppler ultrasound, Hyperlipidemia, Presence of permanent cardiac pacemaker, and Syncope. ? ?Hyperglycemia ?Lab Results  ?Component Value Date  ? HGBA1C 5.9 06/15/2021  ? ?Stable, pt to continue current medical treatment  - diet ? ? ?Hyperlipidemia ?Lab Results  ?Component Value Date  ? LDLCALC 123 (H) 06/15/2021  ? ?uncontrolled, pt to continue current vascepa ? ? ?Low vitamin D level ?Last vitamin D ?Lab Results  ?Component Value Date  ? VD25OH 27.73 (L) 06/15/2021  ? ?Low, to start oral replacement ? ? ?Diarrhea ?Etiology unclear, for c diff toxin study ? ?Hematochezia ?Small volume, for GI urgent referral ? ?Lower abdominal pain ?Cant r/o recurrent colitis, for augmentin course, declines repeat CT ? ?Erectile dysfunction ?For viagra prn,  to f/u any worsening symptoms or concerns ? ?Followup: Return if symptoms worsen or fail to improve. ? ?Oliver Barre, MD 06/18/2021 8:40 PM ?[facility] ?[facility] - [facility] ?Internal Medicine ?

## 2021-06-18 ENCOUNTER — Encounter: Payer: Self-pay | Admitting: Internal Medicine

## 2021-06-18 DIAGNOSIS — R197 Diarrhea, unspecified: Secondary | ICD-10-CM | POA: Insufficient documentation

## 2021-06-18 DIAGNOSIS — R1030 Lower abdominal pain, unspecified: Secondary | ICD-10-CM | POA: Insufficient documentation

## 2021-06-18 DIAGNOSIS — K921 Melena: Secondary | ICD-10-CM | POA: Insufficient documentation

## 2021-06-18 NOTE — Assessment & Plan Note (Signed)
Etiology unclear, for c diff toxin study ?

## 2021-06-18 NOTE — Assessment & Plan Note (Signed)
Cant r/o recurrent colitis, for augmentin course, declines repeat CT ?

## 2021-06-18 NOTE — Assessment & Plan Note (Signed)
Lab Results  ?Component Value Date  ? LDLCALC 123 (H) 06/15/2021  ? ?uncontrolled, pt to continue current vascepa ? ?

## 2021-06-18 NOTE — Assessment & Plan Note (Signed)
Lab Results  ?Component Value Date  ? HGBA1C 5.9 06/15/2021  ? ?Stable, pt to continue current medical treatment  - diet ? ?

## 2021-06-18 NOTE — Assessment & Plan Note (Signed)
For viagra prn,  to f/u any worsening symptoms or concerns 

## 2021-06-18 NOTE — Assessment & Plan Note (Signed)
Last vitamin D Lab Results  Component Value Date   VD25OH 27.73 (L) 06/15/2021   Low, to start oral replacement  

## 2021-06-18 NOTE — Assessment & Plan Note (Signed)
Small volume, for GI urgent referral ?

## 2021-06-22 ENCOUNTER — Ambulatory Visit: Payer: 59 | Admitting: Internal Medicine

## 2021-06-24 LAB — SPECIMEN STATUS REPORT

## 2021-06-24 LAB — CLOSTRIDIUM DIFFICILE BY PCR

## 2021-06-27 ENCOUNTER — Encounter: Payer: Self-pay | Admitting: Gastroenterology

## 2021-07-26 ENCOUNTER — Ambulatory Visit: Payer: 59 | Admitting: Gastroenterology

## 2021-08-24 ENCOUNTER — Encounter: Payer: Self-pay | Admitting: Internal Medicine

## 2021-08-24 ENCOUNTER — Ambulatory Visit (INDEPENDENT_AMBULATORY_CARE_PROVIDER_SITE_OTHER): Payer: 59 | Admitting: Internal Medicine

## 2021-08-24 ENCOUNTER — Ambulatory Visit (INDEPENDENT_AMBULATORY_CARE_PROVIDER_SITE_OTHER): Payer: 59

## 2021-08-24 ENCOUNTER — Telehealth: Payer: Self-pay | Admitting: *Deleted

## 2021-08-24 VITALS — BP 102/66 | HR 74 | Temp 98.2°F | Ht 67.0 in | Wt 161.4 lb

## 2021-08-24 DIAGNOSIS — K6289 Other specified diseases of anus and rectum: Secondary | ICD-10-CM | POA: Insufficient documentation

## 2021-08-24 DIAGNOSIS — K760 Fatty (change of) liver, not elsewhere classified: Secondary | ICD-10-CM | POA: Insufficient documentation

## 2021-08-24 DIAGNOSIS — K625 Hemorrhage of anus and rectum: Secondary | ICD-10-CM | POA: Insufficient documentation

## 2021-08-24 DIAGNOSIS — R739 Hyperglycemia, unspecified: Secondary | ICD-10-CM | POA: Diagnosis not present

## 2021-08-24 DIAGNOSIS — R634 Abnormal weight loss: Secondary | ICD-10-CM

## 2021-08-24 DIAGNOSIS — R195 Other fecal abnormalities: Secondary | ICD-10-CM | POA: Insufficient documentation

## 2021-08-24 DIAGNOSIS — R7989 Other specified abnormal findings of blood chemistry: Secondary | ICD-10-CM | POA: Diagnosis not present

## 2021-08-24 DIAGNOSIS — F1010 Alcohol abuse, uncomplicated: Secondary | ICD-10-CM | POA: Diagnosis not present

## 2021-08-24 DIAGNOSIS — K921 Melena: Secondary | ICD-10-CM

## 2021-08-24 DIAGNOSIS — K219 Gastro-esophageal reflux disease without esophagitis: Secondary | ICD-10-CM | POA: Insufficient documentation

## 2021-08-24 LAB — BASIC METABOLIC PANEL
BUN: 19 mg/dL (ref 6–23)
CO2: 31 mEq/L (ref 19–32)
Calcium: 10.2 mg/dL (ref 8.4–10.5)
Chloride: 99 mEq/L (ref 96–112)
Creatinine, Ser: 0.83 mg/dL (ref 0.40–1.50)
GFR: 101.78 mL/min (ref >60.00)
Glucose, Bld: 89 mg/dL (ref 70–99)
Potassium: 4.2 mEq/L (ref 3.5–5.1)
Sodium: 137 mEq/L (ref 135–145)

## 2021-08-24 LAB — HEPATIC FUNCTION PANEL
ALT: 64 U/L — ABNORMAL HIGH (ref 0–53)
AST: 45 U/L — ABNORMAL HIGH (ref 0–37)
Albumin: 4.7 g/dL (ref 3.5–5.2)
Alkaline Phosphatase: 64 U/L (ref 39–117)
Bilirubin, Direct: 0.1 mg/dL (ref 0.0–0.3)
Total Bilirubin: 0.7 mg/dL (ref 0.2–1.2)
Total Protein: 8.3 g/dL (ref 6.0–8.3)

## 2021-08-24 LAB — CBC WITH DIFFERENTIAL/PLATELET
Basophils Absolute: 0 10*3/uL (ref 0.0–0.1)
Basophils Relative: 0.5 % (ref 0.0–3.0)
Eosinophils Absolute: 0.1 10*3/uL (ref 0.0–0.7)
Eosinophils Relative: 0.8 % (ref 0.0–5.0)
HCT: 47.3 % (ref 39.0–52.0)
Hemoglobin: 15.8 g/dL (ref 13.0–17.0)
Lymphocytes Relative: 27.1 % (ref 12.0–46.0)
Lymphs Abs: 2.7 10*3/uL (ref 0.7–4.0)
MCHC: 33.3 g/dL (ref 30.0–36.0)
MCV: 94.6 fl (ref 78.0–100.0)
Monocytes Absolute: 0.5 10*3/uL (ref 0.1–1.0)
Monocytes Relative: 5.4 % (ref 3.0–12.0)
Neutro Abs: 6.7 10*3/uL (ref 1.4–7.7)
Neutrophils Relative %: 66.2 % (ref 43.0–77.0)
Platelets: 385 10*3/uL (ref 150.0–400.0)
RBC: 5 Mil/uL (ref 4.22–5.81)
RDW: 13.2 % (ref 11.5–15.5)
WBC: 10.1 10*3/uL (ref 4.0–10.5)

## 2021-08-24 LAB — TSH: TSH: 1.27 u[IU]/mL (ref 0.35–5.50)

## 2021-08-24 LAB — HEMOGLOBIN A1C: Hgb A1c MFr Bld: 6.2 % (ref 4.6–6.5)

## 2021-08-24 MED ORDER — ACYCLOVIR 5 % EX CREA
1.0000 | TOPICAL_CREAM | CUTANEOUS | 2 refills | Status: DC
Start: 2021-08-24 — End: 2021-08-25

## 2021-08-24 MED ORDER — VALACYCLOVIR HCL 1 G PO TABS: 1000.0000 mg | ORAL_TABLET | Freq: Two times a day (BID) | ORAL | 5 refills | Status: DC | PRN

## 2021-08-24 NOTE — Assessment & Plan Note (Signed)
S/p recent colonoscopy, and pt to f/u at 4 wks with GI

## 2021-08-24 NOTE — Telephone Encounter (Signed)
Completed another PA.. submitted to [employer] w/  (Key: [id number]) Rec'd msg stating information sent to [employer].Marland KitchenRaechel Chute

## 2021-08-24 NOTE — Progress Notes (Signed)
Patient ID: Chad [name], male   DOB: [birth year], 51 y.o.   MRN: [id number]        Chief Complaint: follow up hyperglycemia, recurrent cold sores, ETOH abuse, wt loss, fatty liver,       HPI:  Chad [name] is a 51 y.o. male here to f/u recent wt loss for unclear reasons, Denies worsening depressive symptoms, suicidal ideation, or panic; has ongoing anxiety, and admits not to ETOH at the equivalent to 16 beers per day at least 4 days per wk.  Has recent recurring cold sores and asks for topical and oral tx.  Denies hyper or hypo thyroid symptoms such as voice, skin or hair change.  Did have recent CT abd with ? Colitis, now having seen GI, colonoscopy completed and pt to f/u at 4 wks for further consideration.  Has also fatty liver, to work on diet per Celso Amy PA now that Dr Matthias Hughs has retired.  Pt denies chest pain, increased sob or doe, wheezing, orthopnea, PND, increased LE swelling, palpitations, dizziness or syncope.   Pt denies polydipsia, polyuria, or new focal neuro s/s  Wt Readings from Last 3 Encounters:  08/24/21 161 lb 6.4 oz (73.2 kg)  06/15/21 166 lb (75.3 kg)  04/29/21 171 lb 2 oz (77.6 kg)   BP Readings from Last 3 Encounters:  08/24/21 102/66  06/15/21 110/62  04/29/21 118/78         Past Medical History:  Diagnosis Date   AICD (automatic cardioverter/defibrillator) present    Brugada syndrome    s/p ICD 12/07   DVT (deep venous thrombosis) (HCC)    Dysrhythmia    Echocardiogram    Echo 10/19: EF 60-65, no RWMA, mild LAE, mild TR, PASP 33   History of Carotid Doppler ultrasound    Carotid US 10/19: normal   Hyperlipidemia    Presence of permanent cardiac pacemaker    Syncope    s/p ICD 12/07   Past Surgical History:  Procedure Laterality Date   BACK SURGERY     CARDIAC DEFIBRILLATOR PLACEMENT  02/2006   [employer]   FINGER TENDON REPAIR Left    2nd digit   ICD GENERATOR CHANGE  03/14/2016   ICD GENERATOR REMOVAL AND INSERTION OF NEW ICD, WITH LEAD  REMOVAL  EXTRACTION Hattie Perch 03/14/2016   ICD LEAD REMOVAL N/A 03/14/2016   Procedure: ICD GENERATOR REMOVAL AND INSERTION OF NEW ICD, WITH LEAD  REMOVAL EXTRACTION;  Surgeon: Marinus Maw, MD;  Location: [facility] OR;  Service: Cardiovascular;  Laterality: N/A;   LUMBAR DISC SURGERY  1997   TEE WITHOUT CARDIOVERSION N/A 03/14/2016   Procedure: TRANSESOPHAGEAL ECHOCARDIOGRAM (TEE);  Surgeon: Marinus Maw, MD;  Location: [facility] OR;  Service: Cardiovascular;  Laterality: N/A;   UPPER EXTREMITY VENOGRAPHY Bilateral 05/12/2016   Procedure: Central Venography;  Surgeon: Sherren Kerns, MD;  Location: [facility] INVASIVE CV LAB;  Service: Cardiovascular;  Laterality: Bilateral;    reports that he quit smoking about 10 years ago. His smoking use included cigarettes. He has a 23.00 pack-year smoking history. He has never used smokeless tobacco. He reports current alcohol use of about 6.0 standard drinks of alcohol per week. He reports that he does not use drugs. family history includes Diabetes in his mother; Heart disease in his mother; Hyperlipidemia in his mother. No Known Allergies Current Outpatient Medications on File Prior to Visit  Medication Sig Dispense Refill   aspirin EC 81 MG tablet Take 81 mg by mouth daily.  atorvastatin (LIPITOR) 20 MG tablet Take 1 tablet (20 mg total) by mouth daily. 90 tablet 2   Cholecalciferol 50 MCG (2000 UT) TABS 1 tab by mouth once daily 30 tablet 99   co-enzyme Q-10 50 MG capsule Take 50 mg by mouth daily. (Patient not taking: Reported on 08/24/2021)     gabapentin (NEURONTIN) 100 MG capsule Take 1 capsule (100 mg total) by mouth 3 (three) times daily. (Patient not taking: Reported on 08/24/2021) 90 capsule 3   Ginkgo Biloba 120 MG TABS Take by mouth. (Patient not taking: Reported on 08/24/2021)     icosapent Ethyl (VASCEPA) 1 g capsule Take 2 capsules (2 g total) by mouth 2 (two) times daily. (Patient not taking: Reported on 08/24/2021) 120 capsule 11   meloxicam (MOBIC) 7.5 MG tablet Take 7.5  mg by mouth daily. (Patient not taking: Reported on 08/24/2021)     metoprolol tartrate (LOPRESSOR) 25 MG tablet Take 0.5 tablets (12.5 mg total) by mouth as needed (for increased heart rate and palpitations). (Patient not taking: Reported on 10/19/2020) 30 tablet 3   milk thistle 175 MG tablet Take 525 mg by mouth daily. (Patient not taking: Reported on 08/24/2021)     Omega-3 Fatty Acids (FISH OIL) 1000 MG CAPS Take by mouth. (Patient not taking: Reported on 08/24/2021)     pantoprazole (PROTONIX) 40 MG tablet Take 1 tablet (40 mg total) by mouth daily. (Patient not taking: Reported on 08/24/2021) 90 tablet 3   sildenafil (VIAGRA) 100 MG tablet Take 0.5-1 tablets (50-100 mg total) by mouth daily as needed for erectile dysfunction. (Patient not taking: Reported on 08/24/2021) 5 tablet 11   sucralfate (CARAFATE) 1 g tablet Take 1 tablet (1 g total) by mouth 4 (four) times daily. (Patient not taking: Reported on 08/24/2021) 120 tablet 0   tadalafil (CIALIS) 20 MG tablet Take 0.5-1 tablets (10-20 mg total) by mouth every other day as needed for erectile dysfunction. (Patient not taking: Reported on 08/24/2021) 10 tablet 11   traMADol (ULTRAM) 50 MG tablet Take 1 tablet (50 mg total) by mouth every 6 (six) hours as needed. (Patient not taking: Reported on 08/24/2021) 30 tablet 0   No current facility-administered medications on file prior to visit.        ROS:  All others reviewed and negative.  Objective        PE:  BP 102/66 (BP Location: Right Arm, Patient Position: Sitting, Cuff Size: Large)   Pulse 74   Temp 98.2 F (36.8 C) (Oral)   Ht 5\' 7"  (1.702 m)   Wt 161 lb 6.4 oz (73.2 kg)   SpO2 97%   BMI 25.28 kg/m                 Constitutional: Pt appears in NAD               HENT: Head: NCAT.                Right Ear: External ear normal.                 Left Ear: External ear normal.                Eyes: . Pupils are equal, round, and reactive to light. Conjunctivae and EOM are normal                Nose: without d/c or deformity  Neck: Neck supple. Gross normal ROM               Cardiovascular: Normal rate and regular rhythm.                 Pulmonary/Chest: Effort normal and breath sounds without rales or wheezing.                Abd:  Soft, NT, ND, + BS, no organomegaly               Neurological: Pt is alert. At baseline orientation, motor grossly intact               Skin: Skin is warm. No rashes, no other new lesions, LE edema - none               Psychiatric: Pt behavior is normal without agitation   Micro: none  Cardiac tracings I have personally interpreted today:  none  Pertinent Radiological findings (summarize): none   Lab Results  Component Value Date   WBC 10.1 08/24/2021   HGB 15.8 08/24/2021   HCT 47.3 08/24/2021   PLT 385.0 08/24/2021   GLUCOSE 89 08/24/2021   CHOL 204 (H) 06/15/2021   TRIG 167.0 (H) 06/15/2021   HDL 48.00 06/15/2021   LDLDIRECT 72.0 03/20/2018   LDLCALC 123 (H) 06/15/2021   ALT 64 (H) 08/24/2021   AST 45 (H) 08/24/2021   NA 137 08/24/2021   K 4.2 08/24/2021   CL 99 08/24/2021   CREATININE 0.83 08/24/2021   BUN 19 08/24/2021   CO2 31 08/24/2021   TSH 1.27 08/24/2021   PSA 0.27 06/15/2021   HGBA1C 6.2 08/24/2021   Assessment/Plan:  Chad [name] is a 51 y.o. Asian [4] male with  has a past medical history of AICD (automatic cardioverter/defibrillator) present, Brugada syndrome, DVT (deep venous thrombosis) (HCC), Dysrhythmia, Echocardiogram, History of Carotid Doppler ultrasound, Hyperlipidemia, Presence of permanent cardiac pacemaker, and Syncope.  Hematochezia S/p recent colonoscopy, and pt to f/u at 4 wks with GI  Hyperglycemia Recent elevated sugar is minor, and normal A1c, so explained to pt that this is not an explanation for his wt loss  Weight loss Etiology unclear, except suspect ETOH and malnutrition, pt encouraged for Boost can at least bid, also for cxr, and lab f/u as ordered today  Abnormal LFTs C/w  fatty liver per GI - for diet per GI as well  Alcohol abuse Pt counseled to abstain, declines rehab for now  Followup: Return in about 6 months (around 02/23/2022).  Oliver Barre, MD 08/27/2021 6:16 PM [facility] - [facility] Medicine

## 2021-08-24 NOTE — Telephone Encounter (Signed)
Rec'd msg stating same thing..." PA Case: [id number], Status: Closed, Closed Reason Code: FM Product not covered by this plan. Prior Authorization not available., Closed Rationale: [employer] Prior Authorization team is unable to review this product for a coverage determination as the requested medication is not on the patient's formulary. Please reach out to [employer] directly for this request. "

## 2021-08-24 NOTE — Telephone Encounter (Signed)
Pt was on cover-my-meds need PA on Acyclovir 5% cream. Submitted PA w/ Babs Sciara. Rec'd msg stating " The [employer] Prior Authorization Department is unable to review this request at this time. Our records indicate that this member is not eligible for prior authorizations or that the review of prior authorizations is not delegated to [employer] Prior Authorization Department. Please review the member's insurance demographics and submit to the appropriate party'. Will notify pt to make sure insurance is correct...Raechel Chute

## 2021-08-24 NOTE — Patient Instructions (Signed)
Please take all new medication as prescribed - the cream and/or the pills for the cold sores  Please make sure to follow up with GI at 4 wks after your colonoscopy  Please stop drinking alcohol  Please continue all other medications as before, and refills have been done if requested.  Please have the pharmacy call with any other refills you may need.  Please keep your appointments with your specialists as you may have planned  Please go to the XRAY Department in the first floor for the x-ray testing  Please go to the LAB at the blood drawing area for the tests to be done  You will be contacted by phone if any changes need to be made immediately.  Otherwise, you will receive a letter about your results with an explanation, but please check with MyChart first.  Please remember to sign up for MyChart if you have not done so, as this will be important to you in the future with finding out test results, communicating by private email, and scheduling acute appointments online when needed.  Please make an Appointment to return in 6 months, or sooner if needed

## 2021-08-25 ENCOUNTER — Encounter: Payer: Self-pay | Admitting: Internal Medicine

## 2021-08-25 MED ORDER — ACYCLOVIR 5 % EX OINT: 1.0000 | TOPICAL_OINTMENT | CUTANEOUS | 2 refills | Status: DC

## 2021-08-25 NOTE — Telephone Encounter (Signed)
Called pt again to verify insurance. Pt states he has [employer] no [employer]. Inform pt to call pharmacy to make sure they are not billing [employer] it need to go under [employer]. Will try to submit another PA.Marland KitchenRaechel Chute

## 2021-08-25 NOTE — Telephone Encounter (Signed)
Called [employer] gave rep pt information along w/ ID. He was able to pull pt up. The Acyclovir 5% cream is not on pt formulary, but the 5% ointment is. Inform rep will send ointment to [facility].Marland KitchenRaechel Chute

## 2021-08-27 ENCOUNTER — Encounter: Payer: Self-pay | Admitting: Internal Medicine

## 2021-08-27 NOTE — Assessment & Plan Note (Signed)
Pt counseled to abstain, declines rehab for now

## 2021-08-27 NOTE — Assessment & Plan Note (Signed)
Recent elevated sugar is minor, and normal A1c, so explained to pt that this is not an explanation for his wt loss

## 2021-08-27 NOTE — Assessment & Plan Note (Addendum)
Etiology unclear, except suspect ETOH and malnutrition, pt encouraged for Boost can at least bid, also for cxr, and lab f/u as ordered today

## 2021-08-27 NOTE — Assessment & Plan Note (Signed)
C/w fatty liver per GI - for diet per GI as well

## 2021-08-31 ENCOUNTER — Ambulatory Visit (INDEPENDENT_AMBULATORY_CARE_PROVIDER_SITE_OTHER): Payer: 59

## 2021-08-31 DIAGNOSIS — Z95810 Presence of automatic (implantable) cardiac defibrillator: Secondary | ICD-10-CM

## 2021-09-02 LAB — CUP PACEART REMOTE DEVICE CHECK
Battery Remaining Longevity: 58 mo
Battery Remaining Percentage: 57 %
Battery Voltage: 2.95 V
Brady Statistic RV Percent Paced: 1 %
Date Time Interrogation Session: 20230622212623
HighPow Impedance: 79 Ohm
HighPow Impedance: 79 Ohm
Implantable Lead Implant Date: 20180102
Implantable Lead Location: 753860
Implantable Lead Model: 7122
Implantable Pulse Generator Implant Date: 20180102
Lead Channel Impedance Value: 460 Ohm
Lead Channel Pacing Threshold Amplitude: 1.25 V
Lead Channel Pacing Threshold Pulse Width: 0.6 ms
Lead Channel Sensing Intrinsic Amplitude: 8.1 mV
Lead Channel Setting Pacing Amplitude: 2.5 V
Lead Channel Setting Pacing Pulse Width: 0.6 ms
Lead Channel Setting Sensing Sensitivity: 0.5 mV
Pulse Gen Serial Number: 1246759

## 2021-09-07 ENCOUNTER — Ambulatory Visit: Payer: 59 | Admitting: Internal Medicine

## 2021-09-12 NOTE — Progress Notes (Signed)
Remote ICD transmission.   

## 2021-09-15 ENCOUNTER — Ambulatory Visit (INDEPENDENT_AMBULATORY_CARE_PROVIDER_SITE_OTHER): Payer: 59 | Admitting: Internal Medicine

## 2021-09-15 ENCOUNTER — Encounter: Payer: Self-pay | Admitting: Internal Medicine

## 2021-09-15 VITALS — BP 100/74 | HR 69 | Temp 98.7°F | Ht 67.0 in | Wt 164.4 lb

## 2021-09-15 DIAGNOSIS — R739 Hyperglycemia, unspecified: Secondary | ICD-10-CM | POA: Diagnosis not present

## 2021-09-15 DIAGNOSIS — R7989 Other specified abnormal findings of blood chemistry: Secondary | ICD-10-CM

## 2021-09-15 DIAGNOSIS — F5101 Primary insomnia: Secondary | ICD-10-CM

## 2021-09-15 DIAGNOSIS — G4700 Insomnia, unspecified: Secondary | ICD-10-CM | POA: Insufficient documentation

## 2021-09-15 MED ORDER — TRAZODONE HCL 100 MG PO TABS: 100.0000 mg | ORAL_TABLET | Freq: Every day | ORAL | 1 refills | Status: DC

## 2021-09-15 NOTE — Assessment & Plan Note (Signed)
Lab Results  Component Value Date   HGBA1C 6.2 08/24/2021   Stable, pt to continue current medical treatment  - diet, wt control, excercise

## 2021-09-15 NOTE — Assessment & Plan Note (Signed)
Improved at last lab, due to ETOH,, pt advised to abstain

## 2021-09-15 NOTE — Assessment & Plan Note (Signed)
With recent mild worsening, for trazodone 100 mg qhs prn

## 2021-09-15 NOTE — Assessment & Plan Note (Signed)
Last vitamin D Lab Results  Component Value Date   VD25OH 27.73 (L) 06/15/2021   Low, reminded to start oral replacement

## 2021-09-15 NOTE — Progress Notes (Signed)
Patient ID: Chad [name], male   DOB: Jun 19, 1970, 51 y.o.   MRN: [id number]        Chief Complaint: follow up worsening insomnia, low vit d, hyperglycemia, abnormal LFts       HPI:  Chad [name] is a 51 y.o. male here with c/o 2 wks worsening sleep such that he gets to sleep at 1am, then only sleep 2 hrs.  Denies worsening depressive symptoms, suicidal ideation, or panic; has ongoing anxiety, with unable to turn brain off, worse to wake in the AM early hours.  Pt denies chest pain, increased sob or doe, wheezing, orthopnea, PND, increased LE swelling, palpitations, dizziness or syncope.   Pt denies polydipsia, polyuria, or new focal neuro s/s.   Still drinking ETOH but less recently.  Declines referral for counseling or SSRI.  Denies worsening reflux, abd pain, dysphagia, n/v, bowel change or blood.  Not taking Vit D.         Wt Readings from Last 3 Encounters:  09/15/21 164 lb 6 oz (74.6 kg)  08/24/21 161 lb 6.4 oz (73.2 kg)  06/15/21 166 lb (75.3 kg)   BP Readings from Last 3 Encounters:  09/15/21 100/74  08/24/21 102/66  06/15/21 110/62         Past Medical History:  Diagnosis Date   AICD (automatic cardioverter/defibrillator) present    Brugada syndrome    s/p ICD 12/07   DVT (deep venous thrombosis) (HCC)    Dysrhythmia    Echocardiogram    Echo 10/19: EF 60-65, no RWMA, mild LAE, mild TR, PASP 33   History of Carotid Doppler ultrasound    Carotid US 10/19: normal   Hyperlipidemia    Presence of permanent cardiac pacemaker    Syncope    s/p ICD 12/07   Past Surgical History:  Procedure Laterality Date   BACK SURGERY     CARDIAC DEFIBRILLATOR PLACEMENT  02/2006   [employer]   FINGER TENDON REPAIR Left    2nd digit   ICD GENERATOR CHANGE  03/14/2016   ICD GENERATOR REMOVAL AND INSERTION OF NEW ICD, WITH LEAD  REMOVAL EXTRACTION Hattie Perch 03/14/2016   ICD LEAD REMOVAL N/A 03/14/2016   Procedure: ICD GENERATOR REMOVAL AND INSERTION OF NEW ICD, WITH LEAD  REMOVAL EXTRACTION;  Surgeon:  Marinus Maw, MD;  Location: [facility] OR;  Service: Cardiovascular;  Laterality: N/A;   LUMBAR DISC SURGERY  1997   TEE WITHOUT CARDIOVERSION N/A 03/14/2016   Procedure: TRANSESOPHAGEAL ECHOCARDIOGRAM (TEE);  Surgeon: Marinus Maw, MD;  Location: [facility] OR;  Service: Cardiovascular;  Laterality: N/A;   UPPER EXTREMITY VENOGRAPHY Bilateral 05/12/2016   Procedure: Central Venography;  Surgeon: Sherren Kerns, MD;  Location: [facility] INVASIVE CV LAB;  Service: Cardiovascular;  Laterality: Bilateral;    reports that he quit smoking about 10 years ago. His smoking use included cigarettes. He has a 23.00 pack-year smoking history. He has never used smokeless tobacco. He reports current alcohol use of about 6.0 standard drinks of alcohol per week. He reports that he does not use drugs. family history includes Diabetes in his mother; Heart disease in his mother; Hyperlipidemia in his mother. No Known Allergies Current Outpatient Medications on File Prior to Visit  Medication Sig Dispense Refill   acyclovir ointment (ZOVIRAX) 5 % Apply 1 Application topically every 3 (three) hours. 15 g 2   aspirin EC 81 MG tablet Take 81 mg by mouth daily.     atorvastatin (LIPITOR) 20 MG tablet Take 1  tablet (20 mg total) by mouth daily. 90 tablet 2   Cholecalciferol 50 MCG (2000 UT) TABS 1 tab by mouth once daily 30 tablet 99   co-enzyme Q-10 50 MG capsule Take 50 mg by mouth daily.     gabapentin (NEURONTIN) 100 MG capsule Take 1 capsule (100 mg total) by mouth 3 (three) times daily. 90 capsule 3   Ginkgo Biloba 120 MG TABS Take by mouth.     icosapent Ethyl (VASCEPA) 1 g capsule Take 2 capsules (2 g total) by mouth 2 (two) times daily. 120 capsule 11   meloxicam (MOBIC) 7.5 MG tablet Take 7.5 mg by mouth daily.     milk thistle 175 MG tablet Take 525 mg by mouth daily.     Omega-3 Fatty Acids (FISH OIL) 1000 MG CAPS Take by mouth.     pantoprazole (PROTONIX) 40 MG tablet Take 1 tablet (40 mg total) by mouth daily. 90 tablet 3    sildenafil (VIAGRA) 100 MG tablet Take 0.5-1 tablets (50-100 mg total) by mouth daily as needed for erectile dysfunction. 5 tablet 11   sucralfate (CARAFATE) 1 g tablet Take 1 tablet (1 g total) by mouth 4 (four) times daily. 120 tablet 0   tadalafil (CIALIS) 20 MG tablet Take 0.5-1 tablets (10-20 mg total) by mouth every other day as needed for erectile dysfunction. 10 tablet 11   traMADol (ULTRAM) 50 MG tablet Take 1 tablet (50 mg total) by mouth every 6 (six) hours as needed. 30 tablet 0   valACYclovir (VALTREX) 1000 MG tablet Take 1 tablet (1,000 mg total) by mouth 2 (two) times daily as needed. 14 tablet 5   metoprolol tartrate (LOPRESSOR) 25 MG tablet Take 0.5 tablets (12.5 mg total) by mouth as needed (for increased heart rate and palpitations). (Patient not taking: Reported on 10/19/2020) 30 tablet 3   No current facility-administered medications on file prior to visit.        ROS:  All others reviewed and negative.  Objective        PE:  BP 100/74   Pulse 69   Temp 98.7 F (37.1 C)   Ht 5\' 7"  (1.702 m)   Wt 164 lb 6 oz (74.6 kg)   SpO2 96%   BMI 25.74 kg/m                 Constitutional: Pt appears in NAD               HENT: Head: NCAT.                Right Ear: External ear normal.                 Left Ear: External ear normal.                Eyes: . Pupils are equal, round, and reactive to light. Conjunctivae and EOM are normal               Nose: without d/c or deformity               Neck: Neck supple. Gross normal ROM               Cardiovascular: Normal rate and regular rhythm.                 Pulmonary/Chest: Effort normal and breath sounds without rales or wheezing.  Abd:  Soft, NT, ND, + BS, no organomegaly               Neurological: Pt is alert. At baseline orientation, motor grossly intact               Skin: Skin is warm. No rashes, no other new lesions, LE edema - none               Psychiatric: Pt behavior is normal without agitation    Micro: none  Cardiac tracings I have personally interpreted today:  none  Pertinent Radiological findings (summarize): none   Lab Results  Component Value Date   WBC 10.1 08/24/2021   HGB 15.8 08/24/2021   HCT 47.3 08/24/2021   PLT 385.0 08/24/2021   GLUCOSE 89 08/24/2021   CHOL 204 (H) 06/15/2021   TRIG 167.0 (H) 06/15/2021   HDL 48.00 06/15/2021   LDLDIRECT 72.0 03/20/2018   LDLCALC 123 (H) 06/15/2021   ALT 64 (H) 08/24/2021   AST 45 (H) 08/24/2021   NA 137 08/24/2021   K 4.2 08/24/2021   CL 99 08/24/2021   CREATININE 0.83 08/24/2021   BUN 19 08/24/2021   CO2 31 08/24/2021   TSH 1.27 08/24/2021   PSA 0.27 06/15/2021   HGBA1C 6.2 08/24/2021   Assessment/Plan:  Chad [name] is a 51 y.o. Asian [4] male with  has a past medical history of AICD (automatic cardioverter/defibrillator) present, Brugada syndrome, DVT (deep venous thrombosis) (HCC), Dysrhythmia, Echocardiogram, History of Carotid Doppler ultrasound, Hyperlipidemia, Presence of permanent cardiac pacemaker, and Syncope.  Low vitamin D level Last vitamin D Lab Results  Component Value Date   VD25OH 27.73 (L) 06/15/2021   Low, reminded to start oral replacement   Hyperglycemia Lab Results  Component Value Date   HGBA1C 6.2 08/24/2021   Stable, pt to continue current medical treatment  - diet, wt control, excercise   Abnormal LFTs Improved at last lab, due to ETOH,, pt advised to abstain  Insomnia With recent mild worsening, for trazodone 100 mg qhs prn  Followup: Return if symptoms worsen or fail to improve.  Oliver Barre, MD 09/15/2021 10:06 PM [facility] - [facility] Medicine

## 2021-09-15 NOTE — Patient Instructions (Signed)
Please take all new medication as prescribed - the trazodone for sleeping - to [facility]  Please continue all other medications as before, and refills have been done if requested.  Please have the pharmacy call with any other refills you may need.  Please continue your efforts at being more active, low cholesterol diet, and weight control.  Please keep your appointments with your specialists as you may have planned

## 2021-10-12 ENCOUNTER — Ambulatory Visit: Payer: 59 | Admitting: Internal Medicine

## 2021-10-26 ENCOUNTER — Ambulatory Visit (INDEPENDENT_AMBULATORY_CARE_PROVIDER_SITE_OTHER): Payer: Commercial Managed Care - HMO | Admitting: Internal Medicine

## 2021-10-26 ENCOUNTER — Encounter: Payer: Self-pay | Admitting: Internal Medicine

## 2021-10-26 VITALS — BP 120/68 | HR 74 | Temp 98.1°F | Ht 67.0 in | Wt 165.0 lb

## 2021-10-26 DIAGNOSIS — R7989 Other specified abnormal findings of blood chemistry: Secondary | ICD-10-CM | POA: Diagnosis not present

## 2021-10-26 DIAGNOSIS — Z125 Encounter for screening for malignant neoplasm of prostate: Secondary | ICD-10-CM

## 2021-10-26 DIAGNOSIS — Z23 Encounter for immunization: Secondary | ICD-10-CM | POA: Diagnosis not present

## 2021-10-26 DIAGNOSIS — E782 Mixed hyperlipidemia: Secondary | ICD-10-CM

## 2021-10-26 DIAGNOSIS — E538 Deficiency of other specified B group vitamins: Secondary | ICD-10-CM

## 2021-10-26 DIAGNOSIS — R739 Hyperglycemia, unspecified: Secondary | ICD-10-CM | POA: Diagnosis not present

## 2021-10-26 NOTE — Assessment & Plan Note (Signed)
Last vitamin D Lab Results  Component Value Date   VD25OH 27.73 (L) 06/15/2021   Low, to start oral replacement

## 2021-10-26 NOTE — Addendum Note (Signed)
Addended by: Corwin Levins on: 10/26/2021 08:51 PM   Modules accepted: Orders

## 2021-10-26 NOTE — Assessment & Plan Note (Signed)
Lab Results  Component Value Date   HGBA1C 6.2 08/24/2021   Stable, pt to continue current medical treatment  - diet, wt control, excercise

## 2021-10-26 NOTE — Progress Notes (Signed)
Patient ID: Chad [name], male   DOB: 31-Aug-1970, 51 y.o.   MRN: [id number]        Chief Complaint: follow up hld, hyperglycemia, low vit d       HPI:  Chad [name] is a 51 y.o. male here overall doing well.  Pt denies chest pain, increased sob or doe, wheezing, orthopnea, PND, increased LE swelling, palpitations, dizziness or syncope.   Pt denies polydipsia, polyuria, or new focal neuro s/s.          Wt Readings from Last 3 Encounters:  10/26/21 165 lb (74.8 kg)  09/15/21 164 lb 6 oz (74.6 kg)  08/24/21 161 lb 6.4 oz (73.2 kg)   BP Readings from Last 3 Encounters:  10/26/21 120/68  09/15/21 100/74  08/24/21 102/66         Past Medical History:  Diagnosis Date   AICD (automatic cardioverter/defibrillator) present    Brugada syndrome    s/p ICD 12/07   DVT (deep venous thrombosis) (HCC)    Dysrhythmia    Echocardiogram    Echo 10/19: EF 60-65, no RWMA, mild LAE, mild TR, PASP 33   History of Carotid Doppler ultrasound    Carotid US 10/19: normal   Hyperlipidemia    Presence of permanent cardiac pacemaker    Syncope    s/p ICD 12/07   Past Surgical History:  Procedure Laterality Date   BACK SURGERY     CARDIAC DEFIBRILLATOR PLACEMENT  02/2006   [employer] Atlas   FINGER TENDON REPAIR Left    2nd digit   ICD GENERATOR CHANGE  03/14/2016   ICD GENERATOR REMOVAL AND INSERTION OF NEW ICD, WITH LEAD  REMOVAL EXTRACTION Hattie Perch 03/14/2016   ICD LEAD REMOVAL N/A 03/14/2016   Procedure: ICD GENERATOR REMOVAL AND INSERTION OF NEW ICD, WITH LEAD  REMOVAL EXTRACTION;  Surgeon: Marinus Maw, MD;  Location: [facility] OR;  Service: Cardiovascular;  Laterality: N/A;   LUMBAR DISC SURGERY  1997   TEE WITHOUT CARDIOVERSION N/A 03/14/2016   Procedure: TRANSESOPHAGEAL ECHOCARDIOGRAM (TEE);  Surgeon: Marinus Maw, MD;  Location[facility] OR;  Service: Cardiovascular;  Laterality: N/A;   UPPER EXTREMITY VENOGRAPHY Bilateral 05/12/2016   Procedure: Central Venography;  Surgeon: Sherren Kerns, MD;  Location: [facility]  INVASIVE CV LAB;  Service: Cardiovascular;  Laterality: Bilateral;    reports that he quit smoking about 10 years ago. His smoking use included cigarettes. He has a 23.00 pack-year smoking history. He has never used smokeless tobacco. He reports current alcohol use of about 6.0 standard drinks of alcohol per week. He reports that he does not use drugs. family history includes Diabetes in his mother; Heart disease in his mother; Hyperlipidemia in his mother. No Known Allergies Current Outpatient Medications on File Prior to Visit  Medication Sig Dispense Refill   acyclovir ointment (ZOVIRAX) 5 % Apply 1 Application topically every 3 (three) hours. 15 g 2   aspirin EC 81 MG tablet Take 81 mg by mouth daily.     atorvastatin (LIPITOR) 20 MG tablet Take 1 tablet (20 mg total) by mouth daily. 90 tablet 2   Cholecalciferol 50 MCG (2000 UT) TABS 1 tab by mouth once daily 30 tablet 99   co-enzyme Q-10 50 MG capsule Take 50 mg by mouth daily.     gabapentin (NEURONTIN) 100 MG capsule Take 1 capsule (100 mg total) by mouth 3 (three) times daily. 90 capsule 3   Ginkgo Biloba 120 MG TABS Take by mouth.  icosapent Ethyl (VASCEPA) 1 g capsule Take 2 capsules (2 g total) by mouth 2 (two) times daily. 120 capsule 11   meloxicam (MOBIC) 7.5 MG tablet Take 7.5 mg by mouth daily.     milk thistle 175 MG tablet Take 525 mg by mouth daily.     Omega-3 Fatty Acids (FISH OIL) 1000 MG CAPS Take by mouth.     pantoprazole (PROTONIX) 40 MG tablet Take 1 tablet (40 mg total) by mouth daily. 90 tablet 3   sildenafil (VIAGRA) 100 MG tablet Take 0.5-1 tablets (50-100 mg total) by mouth daily as needed for erectile dysfunction. 5 tablet 11   sucralfate (CARAFATE) 1 g tablet Take 1 tablet (1 g total) by mouth 4 (four) times daily. 120 tablet 0   tadalafil (CIALIS) 20 MG tablet Take 0.5-1 tablets (10-20 mg total) by mouth every other day as needed for erectile dysfunction. 10 tablet 11   traMADol (ULTRAM) 50 MG tablet Take  1 tablet (50 mg total) by mouth every 6 (six) hours as needed. 30 tablet 0   traZODone (DESYREL) 100 MG tablet Take 1 tablet (100 mg total) by mouth at bedtime. 90 tablet 1   valACYclovir (VALTREX) 1000 MG tablet Take 1 tablet (1,000 mg total) by mouth 2 (two) times daily as needed. 14 tablet 5   No current facility-administered medications on file prior to visit.        ROS:  All others reviewed and negative.  Objective        PE:  BP 120/68 (BP Location: Right Arm, Patient Position: Sitting, Cuff Size: Large)   Pulse 74   Temp 98.1 F (36.7 C) (Oral)   Ht 5\' 7"  (1.702 m)   Wt 165 lb (74.8 kg)   SpO2 95%   BMI 25.84 kg/m                 Constitutional: Pt appears in NAD               HENT: Head: NCAT.                Right Ear: External ear normal.                 Left Ear: External ear normal.                Eyes: . Pupils are equal, round, and reactive to light. Conjunctivae and EOM are normal               Nose: without d/c or deformity               Neck: Neck supple. Gross normal ROM               Cardiovascular: Normal rate and regular rhythm.                 Pulmonary/Chest: Effort normal and breath sounds without rales or wheezing.                Abd:  Soft, NT, ND, + BS, no organomegaly               Neurological: Pt is alert. At baseline orientation, motor grossly intact               Skin: Skin is warm. No rashes, no other new lesions, LE edema - none               Psychiatric: Pt behavior is normal without agitation  Micro: none  Cardiac tracings I have personally interpreted today:  none  Pertinent Radiological findings (summarize): none   Lab Results  Component Value Date   WBC 10.1 08/24/2021   HGB 15.8 08/24/2021   HCT 47.3 08/24/2021   PLT 385.0 08/24/2021   GLUCOSE 89 08/24/2021   CHOL 204 (H) 06/15/2021   TRIG 167.0 (H) 06/15/2021   HDL 48.00 06/15/2021   LDLDIRECT 72.0 03/20/2018   LDLCALC 123 (H) 06/15/2021   ALT 64 (H) 08/24/2021   AST 45  (H) 08/24/2021   NA 137 08/24/2021   K 4.2 08/24/2021   CL 99 08/24/2021   CREATININE 0.83 08/24/2021   BUN 19 08/24/2021   CO2 31 08/24/2021   TSH 1.27 08/24/2021   PSA 0.27 06/15/2021   HGBA1C 6.2 08/24/2021   Assessment/Plan:  Chad [name] is a 51 y.o. Asian [4] male with  has a past medical history of AICD (automatic cardioverter/defibrillator) present, Brugada syndrome, DVT (deep venous thrombosis) (HCC), Dysrhythmia, Echocardiogram, History of Carotid Doppler ultrasound, Hyperlipidemia, Presence of permanent cardiac pacemaker, and Syncope.  Low vitamin D level Last vitamin D Lab Results  Component Value Date   VD25OH 27.73 (L) 06/15/2021   Low, to start oral replacement   Hyperlipidemia Lab Results  Component Value Date   LDLCALC 123 (H) 06/15/2021   uncontrolled, pt to start lipitor 20 mg qd, f/u lab next visit   Hyperglycemia Lab Results  Component Value Date   HGBA1C 6.2 08/24/2021   Stable, pt to continue current medical treatment  - diet, wt control, excercise  Followup: Return in about 6 months (around 04/28/2022).  Oliver Barre, MD 10/26/2021 8:49 PM [facility]  Primary Care - [facility]

## 2021-10-26 NOTE — Assessment & Plan Note (Signed)
Lab Results  Component Value Date   LDLCALC 123 (H) 06/15/2021   uncontrolled, pt to start lipitor 20 mg qd, f/u lab next visit

## 2021-10-26 NOTE — Patient Instructions (Signed)
You had the Shingles shot #1 today  Please continue all other medications as before, and refills have been done if requested.  Please have the pharmacy call with any other refills you may need.  Please continue your efforts at being more active, low cholesterol diet, and weight control.  You are otherwise up to date with prevention measures today.  Please keep your appointments with your specialists as you may have planned  Please make an Appointment to return in 6 months, or sooner if needed, also with Lab Appointment for testing done 3-5 days before at the FIRST FLOOR Lab (so this is for TWO appointments - please see the scheduling desk as you leave)

## 2021-12-27 ENCOUNTER — Other Ambulatory Visit: Payer: Self-pay | Admitting: Internal Medicine

## 2021-12-27 NOTE — Telephone Encounter (Signed)
Please refill as per office routine med refill policy (all routine meds to be refilled for 3 mo or monthly (per pt preference) up to one year from last visit, then month to month grace period for 3 mo, then further med refills will have to be denied) ? ?

## 2022-04-19 ENCOUNTER — Ambulatory Visit: Payer: 59 | Admitting: Internal Medicine

## 2022-05-12 ENCOUNTER — Other Ambulatory Visit: Payer: Self-pay

## 2022-05-12 DIAGNOSIS — E782 Mixed hyperlipidemia: Secondary | ICD-10-CM

## 2022-05-12 MED ORDER — ATORVASTATIN CALCIUM 20 MG PO TABS: ORAL_TABLET | ORAL | 0 refills | Status: DC

## 2022-06-05 IMAGING — CT CT ABD-PELV W/ CM
2 of 5 series · 15 of 46 positions shown, 17 images · IV contrast (OMNIPAQUE)
Comparison: None.

CLINICAL DATA: Abdominal pain, acute, nonlocalized.

EXAM:
CT ABDOMEN AND PELVIS WITH CONTRAST
TECHNIQUE: Multidetector CT imaging of the abdomen and pelvis was performed
using the standard protocol following bolus administration of
intravenous contrast.

[Series 2: axial st · axial · 0.76mm/px · z∈[-458,-12]mm · 12 of 101 slices shown, 14 images]
[im 6/101  soft-tissue]
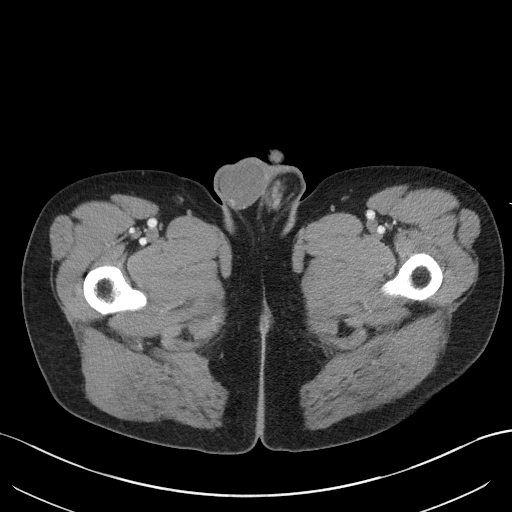
[im 6/101  bone]
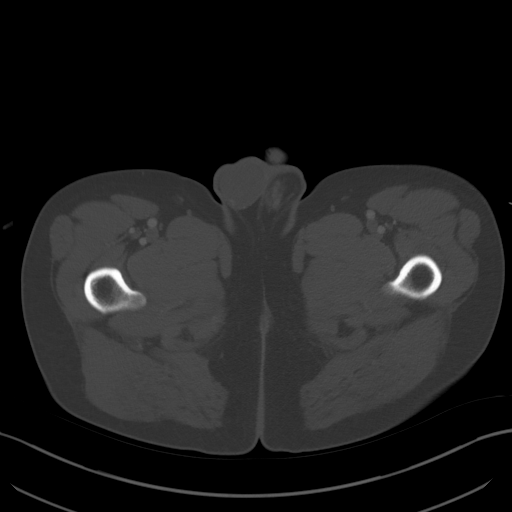
[im 18/101  soft-tissue]
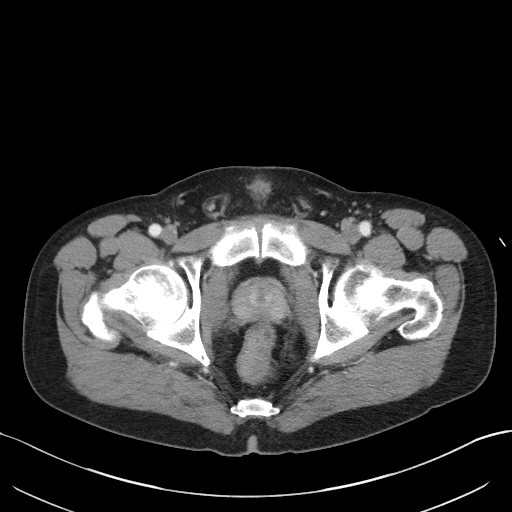
[im 24/101  soft-tissue]
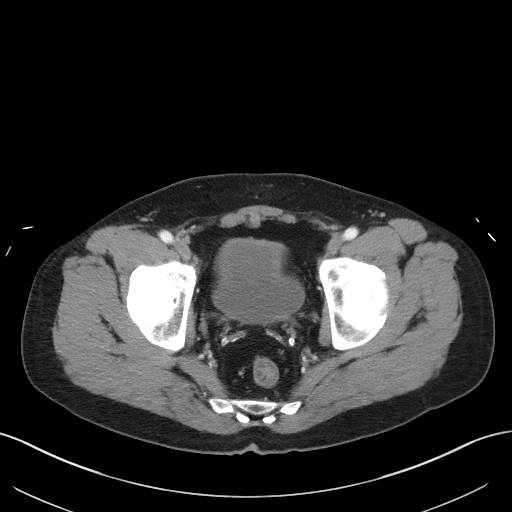
[im 30/101  soft-tissue]
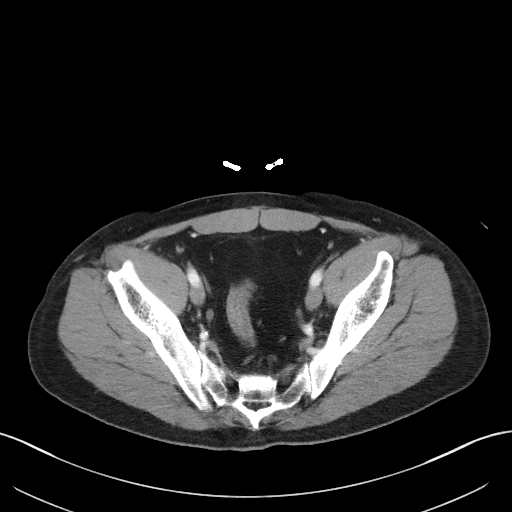
[im 42/101  soft-tissue]
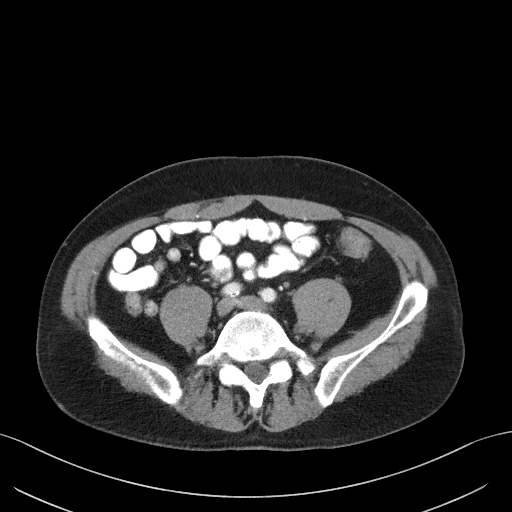
[im 48/101  soft-tissue]
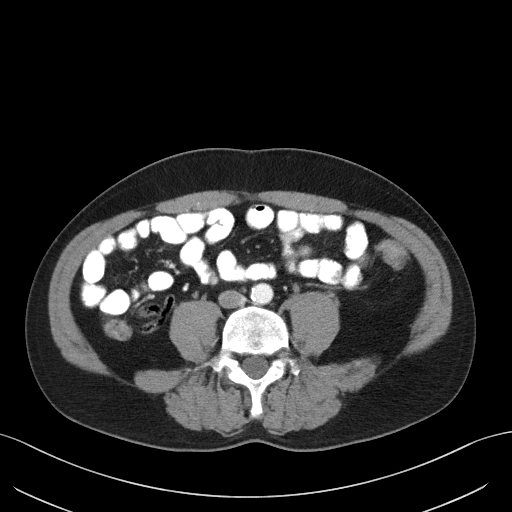
[im 53/101  soft-tissue]
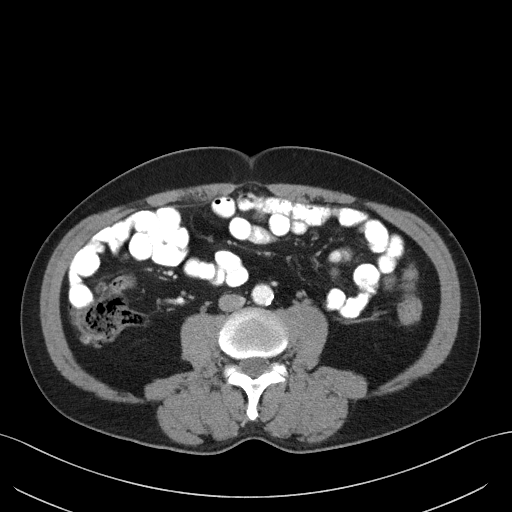
[im 65/101  soft-tissue]
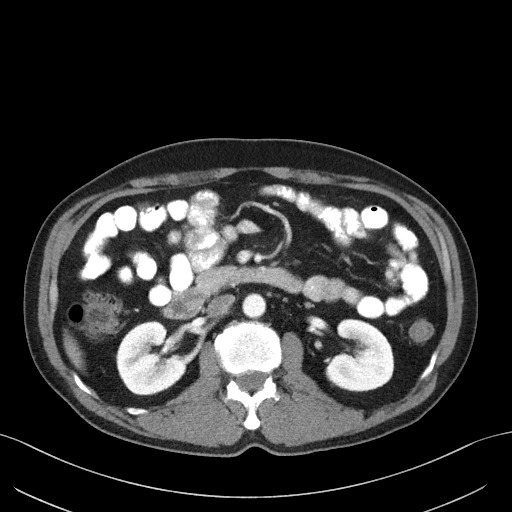
[im 71/101  soft-tissue]
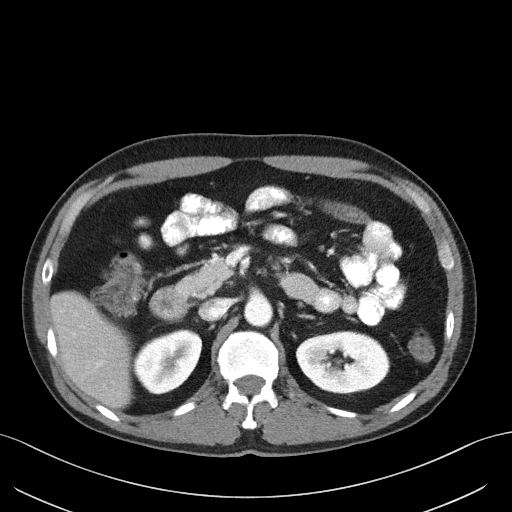
[im 71/101  bone]
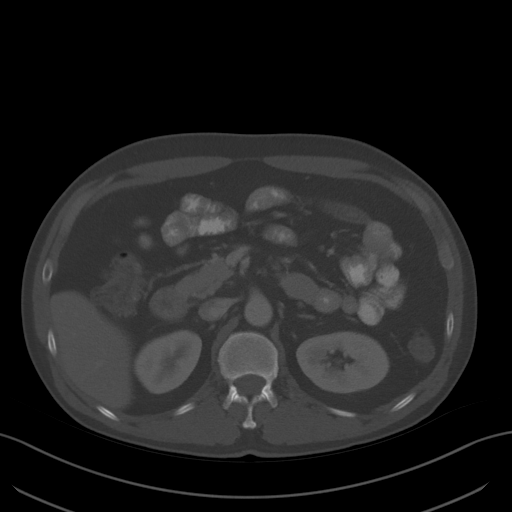
[im 77/101  soft-tissue]
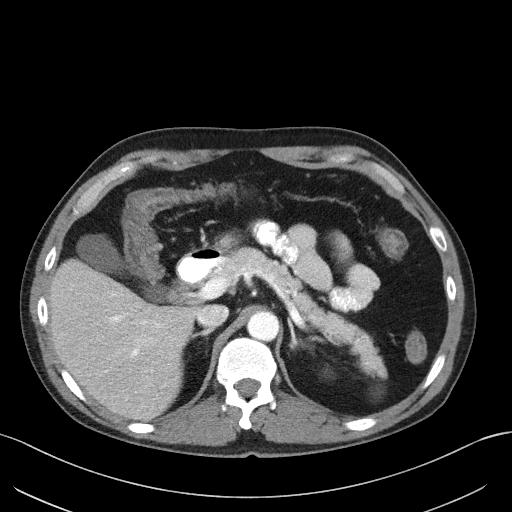
[im 89/101  soft-tissue]
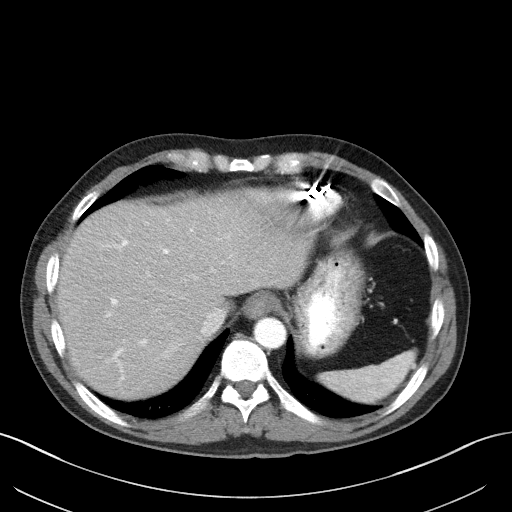
[im 95/101  soft-tissue]
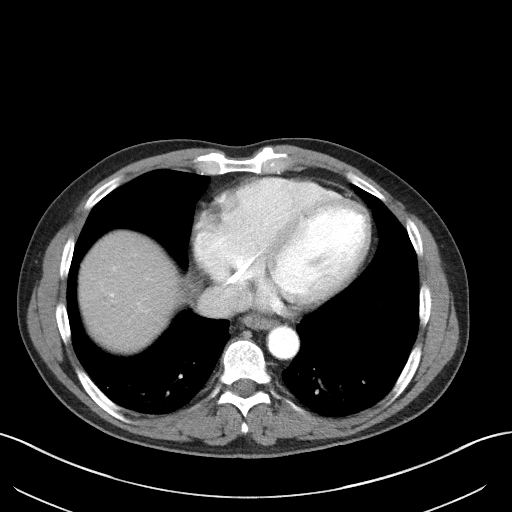

[Series 4: coronal st · coronal · 0.76mm/px · 3 of 85 slices shown]
[im 29/85  soft-tissue]
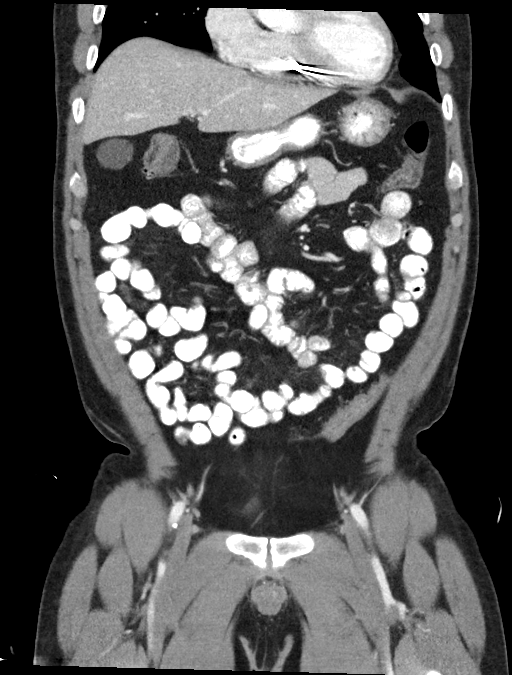
[im 38/85  soft-tissue]
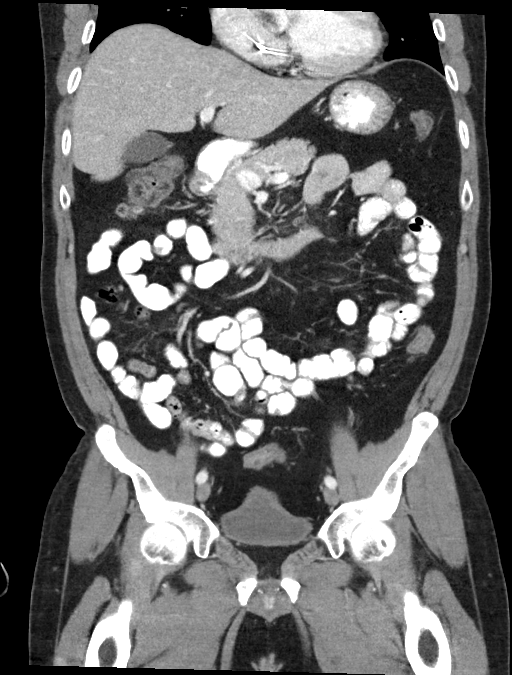
[im 47/85  soft-tissue]
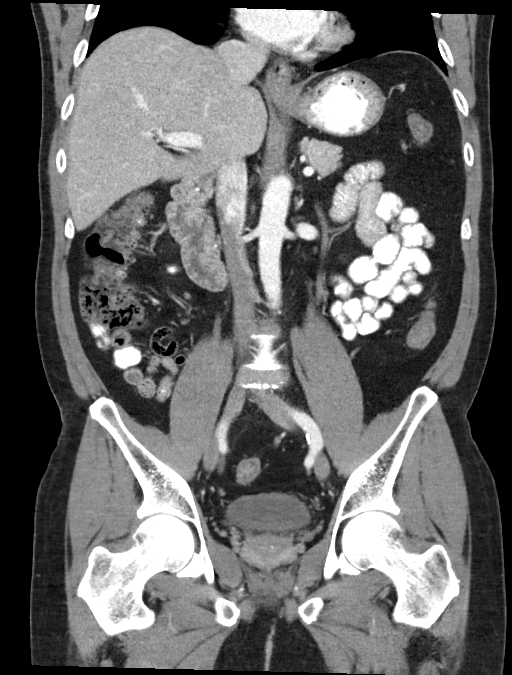

[15 of 46 positions shown; findings below may reference images not displayed]

RADIATION DOSE REDUCTION: This exam was performed according to the
departmental dose-optimization program which includes automated
exposure control, adjustment of the mA and/or kV according to
patient size and/or use of iterative reconstruction technique.

CONTRAST:  100mL OMNIPAQUE IOHEXOL 300 MG/ML  SOLN
FINDINGS: Lower chest: A pacemaker lead is present in the heart. Dependent
atelectasis is noted at the lung bases.

Hepatobiliary: Fatty infiltration of the liver is noted. No focal
abnormality. The gallbladder is without stones. No biliary ductal
dilatation.

Pancreas: Unremarkable. No pancreatic ductal dilatation or
surrounding inflammatory changes.

Spleen: Normal in size without focal abnormality.

Adrenals/Urinary Tract: Adrenal glands are unremarkable. Kidneys are
normal, without renal calculi, focal lesion, or hydronephrosis.
Bladder is unremarkable.

Stomach/Bowel: The stomach is within normal limits. No bowel
obstruction, free air, or pneumatosis. There is colonic wall
thickening involving the ascending and transverse colon with fatty
infiltration suggesting chronic inflammatory changes. There is
questionable mild subtle fat stranding and hyperemia at the colon. A
few scattered diverticula are present along the colon without
evidence of diverticulitis. The appendix is normal in caliber and
contains air.

Vascular/Lymphatic: Aortic atherosclerosis. No enlarged abdominal or
pelvic lymph nodes.

Reproductive: Prostate is unremarkable.

Other: A small fat containing umbilical hernia and inguinal hernias.
No free fluid.

Musculoskeletal: No acute or significant osseous findings.
IMPRESSION: 1. No bowel obstruction or free air.
2. Findings compatible with chronic inflammatory changes of the
colon. There is questionable subtle fat stranding at the transverse
and descending colon, possible superimposed mild infectious or
inflammatory colitis.
3. Diverticulosis without diverticulitis.
4. Hepatic steatosis.
5. Aortic atherosclerosis.

## 2022-07-02 ENCOUNTER — Other Ambulatory Visit: Payer: Self-pay | Admitting: Internal Medicine

## 2022-07-02 DIAGNOSIS — E782 Mixed hyperlipidemia: Secondary | ICD-10-CM

## 2022-07-21 ENCOUNTER — Other Ambulatory Visit: Payer: Self-pay

## 2022-07-21 DIAGNOSIS — E782 Mixed hyperlipidemia: Secondary | ICD-10-CM

## 2022-07-21 MED ORDER — ATORVASTATIN CALCIUM 20 MG PO TABS: ORAL_TABLET | ORAL | 0 refills | Status: DC

## 2022-07-30 NOTE — Progress Notes (Unsigned)
Cardiology Office Note Date:  07/31/2022  Patient ID:  Chad [name], Chad [name] 05-21-1970, MRN [id number] PCP:  Corwin Levins, MD  Electrophysiologist: Dr. Ladona Ridgel    Chief Complaint: annual visit  History of Present Illness: Chad [name] is a 52 y.o. male with history of Brugada syndrome w/ICD, DVT, HLD  He saw Dr. Ladona Ridgel Nov 2019, doing OK, infrequent waxing/waning LUE/neck swelling with hx of ICD extraction (2/2 fractured lead) and subsequent DVT LUE No noted VT, LUE stable, no changes were made.  He saw A. Tillery, PA-C 11/25/19, occ palpitations with coffee, some with SOB, dicussed PSVT, no VT.  Offerred to start BB though the pt preferred to avoid daily med, opted for PRN lopressor.  TODAY He is doing OK, though frequency of his palpitations are increasing, used to be now and then, now he feels them a couple times a week sometimes, usually lasting seconds to a couple minutes, make him reflexively cough, coughing doesn't stop it usual, but has. No near syncope or syncope, longer episodes make him feel a little SOB, and tired afterwards No trigger or patten. Denies any new or skipped medicines, but does say that he has not been on most of our listed meds for well over a year or more. Denies caffeine, stimulants, dugs, ETOH He is active but doesn't exercise No CP  He has never had or taken metoprolol in the past (despite note/order by Mardelle Matte)   Device information SJM single chamber ICD implanted 02/16/2006 RV lead failure with evidence of externalization System extracted 03/14/2016 Abbott single chamber ICD implanted 03/14/2016  Past Medical History:  Diagnosis Date   AICD (automatic cardioverter/defibrillator) present    Brugada syndrome    s/p ICD 12/07   DVT (deep venous thrombosis) (HCC)    Dysrhythmia    Echocardiogram    Echo 10/19: EF 60-65, no RWMA, mild LAE, mild TR, PASP 33   History of Carotid Doppler ultrasound    Carotid US 10/19: normal   Hyperlipidemia    Presence of  permanent cardiac pacemaker    Syncope    s/p ICD 12/07    Past Surgical History:  Procedure Laterality Date   BACK SURGERY     CARDIAC DEFIBRILLATOR PLACEMENT  02/2006   [employer]. Jude Atlas   FINGER TENDON REPAIR Left    2nd digit   ICD GENERATOR CHANGE  03/14/2016   ICD GENERATOR REMOVAL AND INSERTION OF NEW ICD, WITH LEAD  REMOVAL EXTRACTION Hattie Perch 03/14/2016   ICD LEAD REMOVAL N/A 03/14/2016   Procedure: ICD GENERATOR REMOVAL AND INSERTION OF NEW ICD, WITH LEAD  REMOVAL EXTRACTION;  Surgeon: Marinus Maw, MD;  Location: [facility] OR;  Service: Cardiovascular;  Laterality: N/A;   LUMBAR DISC SURGERY  1997   TEE WITHOUT CARDIOVERSION N/A 03/14/2016   Procedure: TRANSESOPHAGEAL ECHOCARDIOGRAM (TEE);  Surgeon: Marinus Maw, MD;  Location: [facility] OR;  Service: Cardiovascular;  Laterality: N/A;   UPPER EXTREMITY VENOGRAPHY Bilateral 05/12/2016   Procedure: Central Venography;  Surgeon: Sherren Kerns, MD;  Location: [facility] INVASIVE CV LAB;  Service: Cardiovascular;  Laterality: Bilateral;    Current Outpatient Medications  Medication Sig Dispense Refill   acidophilus (RISAQUAD) CAPS capsule Take 1 capsule by mouth daily.     aspirin EC 81 MG tablet Take 81 mg by mouth daily.     atorvastatin (LIPITOR) 20 MG tablet TAKE 1 TABLET(20 MG) BY MOUTH DAILY 90 tablet 0   metoprolol succinate (TOPROL XL) 25 MG 24 hr tablet Take  1 tablet (25 mg total) by mouth daily. 90 tablet 2   acyclovir ointment (ZOVIRAX) 5 % Apply 1 Application topically every 3 (three) hours. (Patient not taking: Reported on 07/31/2022) 15 g 2   Cholecalciferol 50 MCG (2000 UT) TABS 1 tab by mouth once daily (Patient not taking: Reported on 07/31/2022) 30 tablet 99   co-enzyme Q-10 50 MG capsule Take 50 mg by mouth daily. (Patient not taking: Reported on 07/31/2022)     gabapentin (NEURONTIN) 100 MG capsule Take 1 capsule (100 mg total) by mouth 3 (three) times daily. (Patient not taking: Reported on 07/31/2022) 90 capsule 3   Ginkgo Biloba 120 MG  TABS Take by mouth. (Patient not taking: Reported on 07/31/2022)     icosapent Ethyl (VASCEPA) 1 g capsule Take 2 capsules (2 g total) by mouth 2 (two) times daily. (Patient not taking: Reported on 07/31/2022) 120 capsule 11   meloxicam (MOBIC) 7.5 MG tablet Take 7.5 mg by mouth daily. (Patient not taking: Reported on 07/31/2022)     milk thistle 175 MG tablet Take 525 mg by mouth daily. (Patient not taking: Reported on 07/31/2022)     Omega-3 Fatty Acids (FISH OIL) 1000 MG CAPS Take by mouth. (Patient not taking: Reported on 07/31/2022)     pantoprazole (PROTONIX) 40 MG tablet Take 1 tablet (40 mg total) by mouth daily. (Patient not taking: Reported on 07/31/2022) 90 tablet 3   sildenafil (VIAGRA) 100 MG tablet Take 0.5-1 tablets (50-100 mg total) by mouth daily as needed for erectile dysfunction. (Patient not taking: Reported on 07/31/2022) 5 tablet 11   sucralfate (CARAFATE) 1 g tablet Take 1 tablet (1 g total) by mouth 4 (four) times daily. (Patient not taking: Reported on 07/31/2022) 120 tablet 0   tadalafil (CIALIS) 20 MG tablet Take 0.5-1 tablets (10-20 mg total) by mouth every other day as needed for erectile dysfunction. (Patient not taking: Reported on 07/31/2022) 10 tablet 11   traMADol (ULTRAM) 50 MG tablet Take 1 tablet (50 mg total) by mouth every 6 (six) hours as needed. (Patient not taking: Reported on 07/31/2022) 30 tablet 0   traZODone (DESYREL) 100 MG tablet Take 1 tablet (100 mg total) by mouth at bedtime. (Patient not taking: Reported on 07/31/2022) 90 tablet 1   valACYclovir (VALTREX) 1000 MG tablet Take 1 tablet (1,000 mg total) by mouth 2 (two) times daily as needed. (Patient not taking: Reported on 07/31/2022) 14 tablet 5   No current facility-administered medications for this visit.    Allergies:   Patient has no known allergies.   Social History:  The patient  reports that he quit smoking about 11 years ago. His smoking use included cigarettes. He has a 23.00 pack-year smoking  history. He has never used smokeless tobacco. He reports current alcohol use of about 6.0 standard drinks of alcohol per week. He reports that he does not use drugs.   Family History:  The patient's family history includes Diabetes in his mother; Heart disease in his mother; Hyperlipidemia in his mother.  ROS:  Please see the history of present illness.    All other systems are reviewed and otherwise negative.   PHYSICAL EXAM:  VS:  BP 118/80   Pulse 68   Wt 166 lb 12.8 oz (75.7 kg)   SpO2 98%   BMI 26.12 kg/m  BMI: Body mass index is 26.12 kg/m. Well nourished, well developed, in no acute distress HEENT: normocephalic, atraumatic Neck: no JVD, carotid bruits or masses Cardiac:  RRR; no significant murmurs, no rubs, or gallops Lungs:   CTA b/l, no wheezing, rhonchi or rales Abd: soft, nontender MS: no deformity or atrophy Ext:  no edema Skin: warm and dry, no rash Neuro:  No gross deficits appreciated Psych: euthymic mood, full affect  ICD site is stable, no tethering or discomfort   EKG:  Done today and reviewed by myself shows  SR 68bpm  Device interrogation done today and reviewed by myself:  Battery and lead measurements are OK, RV threshold up slightly Several SVTs One tracing appears once to have some NSVTs within it No VT otherwise, no therapies RF had timed out Reset RV outputs adjusted for 2:1   01/02/2018: stress myoview The left ventricular ejection fraction is mildly decreased (45-54%). Nuclear stress EF: 53%. There was no ST segment deviation noted during stress. The study is normal. This is a low risk study.   12/12/2017: TTE Study Conclusions  - Left ventricle: The cavity size was normal. Wall thickness was    normal. Systolic function was normal. The estimated ejection    fraction was in the range of 60% to 65%. Wall motion was normal;    there were no regional wall motion abnormalities.  - Left atrium: The atrium was mildly dilated.  -  Pulmonary arteries: PA peak pressure: 33 mm Hg (S).   Impressions:  - Normal LV function; mild LAE; mild TR.    Recent Labs: 08/24/2021: ALT 64; BUN 19; Creatinine, Ser 0.83; Hemoglobin 15.8; Platelets 385.0; Potassium 4.2; Sodium 137; TSH 1.27  No results found for requested labs within last 365 days.   CrCl cannot be calculated (Patient's most recent lab result is older than the maximum 21 days allowed.).   Wt Readings from Last 3 Encounters:  07/31/22 166 lb 12.8 oz (75.7 kg)  10/26/21 165 lb (74.8 kg)  09/15/21 164 lb 6 oz (74.6 kg)     Other studies reviewed: Additional studies/records reviewed today include: summarized above  ASSESSMENT AND PLAN:  ICD Intact function  Brugada Syndrome No symptoms, VT's  3. SVT These have been seen for him before All available tracings reviewed, one lead only though by morphology uis [id number]'s Will start Toprol 25mg  QD Will have him wear a 2 week monitor to see it if we can    Disposition: F/u with [place] in a couple months to revisit burden on BB, sooner if needed    Current medicines are reviewed at length with the patient today.  The patient did not have any concerns regarding medicines.  Norma Fredrickson, PA-C 07/31/2022 5:42 PM     [facility] [address] [place] [address] [phone] (office)  [phone] (fax)

## 2022-07-31 ENCOUNTER — Ambulatory Visit: Payer: 59 | Attending: Physician Assistant | Admitting: Physician Assistant

## 2022-07-31 ENCOUNTER — Other Ambulatory Visit (INDEPENDENT_AMBULATORY_CARE_PROVIDER_SITE_OTHER): Payer: 59

## 2022-07-31 ENCOUNTER — Encounter: Payer: Self-pay | Admitting: Physician Assistant

## 2022-07-31 VITALS — BP 118/80 | HR 68 | Wt 166.8 lb

## 2022-07-31 DIAGNOSIS — I471 Supraventricular tachycardia, unspecified: Secondary | ICD-10-CM

## 2022-07-31 DIAGNOSIS — Z95810 Presence of automatic (implantable) cardiac defibrillator: Secondary | ICD-10-CM | POA: Diagnosis not present

## 2022-07-31 DIAGNOSIS — R42 Dizziness and giddiness: Secondary | ICD-10-CM

## 2022-07-31 DIAGNOSIS — I498 Other specified cardiac arrhythmias: Secondary | ICD-10-CM | POA: Diagnosis not present

## 2022-07-31 LAB — CUP PACEART INCLINIC DEVICE CHECK
Battery Remaining Longevity: 51 mo
Brady Statistic RV Percent Paced: 0 %
Date Time Interrogation Session: 20240520175107
HighPow Impedance: 83.25 Ohm
Implantable Lead Connection Status: 753985
Implantable Lead Implant Date: 20180102
Implantable Lead Location: 753860
Implantable Lead Model: 7122
Implantable Pulse Generator Implant Date: 20180102
Lead Channel Impedance Value: 475 Ohm
Lead Channel Pacing Threshold Amplitude: 1.5 V
Lead Channel Pacing Threshold Amplitude: 1.5 V
Lead Channel Pacing Threshold Pulse Width: 0.6 ms
Lead Channel Pacing Threshold Pulse Width: 0.6 ms
Lead Channel Sensing Intrinsic Amplitude: 8.8 mV
Lead Channel Setting Pacing Amplitude: 3 V
Lead Channel Setting Pacing Pulse Width: 0.6 ms
Lead Channel Setting Sensing Sensitivity: 0.5 mV
Pulse Gen Serial Number: 1246759
Zone Setting Status: 755011

## 2022-07-31 MED ORDER — METOPROLOL SUCCINATE ER 25 MG PO TB24: 25.0000 mg | ORAL_TABLET | Freq: Every day | ORAL | 2 refills | Status: DC

## 2022-07-31 NOTE — Progress Notes (Unsigned)
Enrolled for [employer] to mail a ZIO AT Live Telemetry monitor to patients address on file.   Dr. Taylor to read. 

## 2022-07-31 NOTE — Patient Instructions (Addendum)
Medication Instructions:    START TAKING: TOPROL XL  25 MG ONCE A DAY    *If you need a refill on your cardiac medications before your next appointment, please call your pharmacy*   Lab Work:  NONE ORDERED  TODAY   If you have labs (blood work) drawn today and your tests are completely normal, you will receive your results only by: MyChart Message (if you have MyChart) OR A paper copy in the mail If you have any lab test that is abnormal or we need to change your treatment, we will call you to review the results.   Testing/Procedures:  Your physician has recommended that you wear an event monitor. Event monitors are medical devices that record the heart's electrical activity. Doctors most often [place] these monitors to diagnose arrhythmias. Arrhythmias are problems with the speed or rhythm of the heartbeat. The monitor is a small, portable device. You can wear one while you do your normal daily activities. This is usually used to diagnose what is causing palpitations/syncope (passing out).     Follow-Up: At [facility], you and your health needs are our priority.  As part of our continuing mission to provide you with exceptional heart care, we have created designated Provider Care Teams.  These Care Teams include your primary Cardiologist (physician) and Advanced Practice Providers (APPs -  Physician Assistants and Nurse Practitioners) who all work together to provide you with the care you need, when you need it.  We recommend signing up for the patient portal called "MyChart".  Sign up information is provided on this After Visit Summary.  MyChart is used to connect with patients for Virtual Visits (Telemedicine).  Patients are able to view lab/test results, encounter notes, upcoming appointments, etc.  Non-urgent messages can be sent to your provider as well.   To learn more about what you can do with MyChart, go to [email or web address].    Your next appointment:   2-3  month(s)  Provider:   You may see Lewayne Bunting, MD    Other Instructions     ZIO AT Long term monitor-Live Telemetry  Your physician has requested you wear a ZIO patch monitor for 14 days.  This is a single patch monitor. [employer] supplies one patch monitor per enrollment. Additional  stickers are not available.  Please do not apply patch if you will be having a Nuclear Stress Test, Echocardiogram, Cardiac CT, MRI,  or Chest Xray during the period you would be wearing the monitor. The patch cannot be worn during  these tests. You cannot remove and re-apply the ZIO AT patch monitor.  Your ZIO patch monitor will be mailed 3 day USPS to your address on file. It may take 3-5 days to  receive your monitor after you have been enrolled.  Once you have received your monitor, please review the enclosed instructions. Your monitor has  already been registered assigning a specific monitor serial # to you.   Billing and Patient Assistance Program information  Meredeth Ide has been supplied with any insurance information on record for billing. [employer] offers a sliding scale Patient Assistance Program for patients without insurance, or whose  insurance does not completely cover the cost of the ZIO patch monitor. You must apply for the  Patient Assistance Program to qualify for the discounted rate. To apply, call [employer] at [phone],  select option 4, select option 2 , ask to apply for the Patient Assistance Program, (you can request an  interpreter if needed). [employer]  will ask your household income and how many people are in your  household. [employer] will quote your out-of-pocket cost based on this information. They will also be able  to set up a 12 month interest free payment plan if needed.  Applying the monitor   Shave hair from upper left chest.  Hold the abrader disc by orange tab. Rub the abrader in 40 strokes over left upper chest as indicated in  your monitor instructions.  Clean area  with 4 enclosed alcohol pads. Use all pads to ensure the area is cleaned thoroughly. Let  dry.  Apply patch as indicated in monitor instructions. Patch will be placed under collarbone on left side of  chest with arrow pointing upward.  Rub patch adhesive wings for 2 minutes. Remove the white label marked "1". Remove the white label  marked "2". Rub patch adhesive wings for 2 additional minutes.  While looking in a mirror, press and release button in center of patch. A small green light will flash 3-4  times. This will be your only indicator that the monitor has been turned on.  Do not shower for the first 24 hours. You may shower after the first 24 hours.  Press the button if you feel a symptom. You will hear a small click. Record Date, Time and Symptom in  the Patient Log.   Starting the Gateway  In your kit there is a Audiological scientist box the size of a cellphone. This is Buyer, retail. It transmits all your  recorded data to [facility]. This box must always stay within 10 feet of you. Open the box and push the *  button. There will be a light that blinks orange and then green a few times. When the light stops  blinking, the Gateway is connected to the ZIO patch. Call [employer] at [phone] to confirm your monitor is transmitting.  Returning your monitor  Remove your patch and place it inside the Gateway. In the lower half of the Gateway there is a white  bag with prepaid postage on it. Place Gateway in bag and seal. Mail package back to [place] as soon as  possible. Your physician should have your final report approximately 7 days after you have mailed back  your monitor. Call [facility] Customer Care at [phone] if you have questions regarding your ZIO AT  patch monitor. Call them immediately if you see an orange light blinking on your monitor.  If your monitor falls off in less than 4 days, contact our Monitor department at [phone]. If your  monitor becomes loose or  falls off after 4 days call [employer] at [phone] for suggestions on  securing your monitor

## 2022-08-01 ENCOUNTER — Ambulatory Visit (INDEPENDENT_AMBULATORY_CARE_PROVIDER_SITE_OTHER): Payer: 59

## 2022-08-01 DIAGNOSIS — I429 Cardiomyopathy, unspecified: Secondary | ICD-10-CM | POA: Diagnosis not present

## 2022-08-01 DIAGNOSIS — R55 Syncope and collapse: Secondary | ICD-10-CM

## 2022-08-01 LAB — CUP PACEART REMOTE DEVICE CHECK
Battery Remaining Longevity: 47 mo
Battery Remaining Percentage: 50 %
Battery Voltage: 2.95 V
Brady Statistic RV Percent Paced: 0 %
Date Time Interrogation Session: 20240521052857
HighPow Impedance: 91 Ohm
HighPow Impedance: 91 Ohm
Implantable Lead Connection Status: 753985
Implantable Lead Implant Date: 20180102
Implantable Lead Location: 753860
Implantable Lead Model: 7122
Implantable Pulse Generator Implant Date: 20180102
Lead Channel Impedance Value: 510 Ohm
Lead Channel Pacing Threshold Amplitude: 1.5 V
Lead Channel Pacing Threshold Pulse Width: 0.6 ms
Lead Channel Sensing Intrinsic Amplitude: 10.5 mV
Lead Channel Setting Pacing Amplitude: 3 V
Lead Channel Setting Pacing Pulse Width: 0.6 ms
Lead Channel Setting Sensing Sensitivity: 0.5 mV
Pulse Gen Serial Number: 1246759
Zone Setting Status: 755011

## 2022-08-04 ENCOUNTER — Ambulatory Visit (INDEPENDENT_AMBULATORY_CARE_PROVIDER_SITE_OTHER): Payer: 59 | Admitting: Internal Medicine

## 2022-08-04 ENCOUNTER — Encounter: Payer: Self-pay | Admitting: Internal Medicine

## 2022-08-04 VITALS — BP 122/70 | HR 58 | Temp 98.1°F | Ht 67.0 in | Wt 167.0 lb

## 2022-08-04 DIAGNOSIS — F419 Anxiety disorder, unspecified: Secondary | ICD-10-CM

## 2022-08-04 DIAGNOSIS — E782 Mixed hyperlipidemia: Secondary | ICD-10-CM

## 2022-08-04 DIAGNOSIS — R739 Hyperglycemia, unspecified: Secondary | ICD-10-CM | POA: Diagnosis not present

## 2022-08-04 DIAGNOSIS — R7989 Other specified abnormal findings of blood chemistry: Secondary | ICD-10-CM

## 2022-08-04 DIAGNOSIS — Z23 Encounter for immunization: Secondary | ICD-10-CM

## 2022-08-04 DIAGNOSIS — R21 Rash and other nonspecific skin eruption: Secondary | ICD-10-CM

## 2022-08-04 DIAGNOSIS — E559 Vitamin D deficiency, unspecified: Secondary | ICD-10-CM | POA: Diagnosis not present

## 2022-08-04 DIAGNOSIS — E538 Deficiency of other specified B group vitamins: Secondary | ICD-10-CM | POA: Diagnosis not present

## 2022-08-04 DIAGNOSIS — Z0001 Encounter for general adult medical examination with abnormal findings: Secondary | ICD-10-CM | POA: Diagnosis not present

## 2022-08-04 DIAGNOSIS — Z125 Encounter for screening for malignant neoplasm of prostate: Secondary | ICD-10-CM | POA: Diagnosis not present

## 2022-08-04 LAB — HEPATIC FUNCTION PANEL
ALT: 41 U/L (ref 0–53)
AST: 28 U/L (ref 0–37)
Albumin: 4.6 g/dL (ref 3.5–5.2)
Alkaline Phosphatase: 47 U/L (ref 39–117)
Bilirubin, Direct: 0.1 mg/dL (ref 0.0–0.3)
Total Bilirubin: 0.7 mg/dL (ref 0.2–1.2)
Total Protein: 7.2 g/dL (ref 6.0–8.3)

## 2022-08-04 LAB — URINALYSIS, ROUTINE W REFLEX MICROSCOPIC
Bilirubin Urine: NEGATIVE
Hgb urine dipstick: NEGATIVE
Ketones, ur: NEGATIVE
Leukocytes,Ua: NEGATIVE
Nitrite: NEGATIVE
Specific Gravity, Urine: 1.025 (ref 1.000–1.030)
Total Protein, Urine: NEGATIVE
Urine Glucose: NEGATIVE
Urobilinogen, UA: 0.2 (ref 0.0–1.0)
pH: 6 (ref 5.0–8.0)

## 2022-08-04 LAB — CBC WITH DIFFERENTIAL/PLATELET
Basophils Absolute: 0 10*3/uL (ref 0.0–0.1)
Basophils Relative: 0.5 % (ref 0.0–3.0)
Eosinophils Absolute: 0.1 10*3/uL (ref 0.0–0.7)
Eosinophils Relative: 1.1 % (ref 0.0–5.0)
HCT: 44.4 % (ref 39.0–52.0)
Hemoglobin: 14.4 g/dL (ref 13.0–17.0)
Lymphocytes Relative: 33.2 % (ref 12.0–46.0)
Lymphs Abs: 2.5 10*3/uL (ref 0.7–4.0)
MCHC: 32.4 g/dL (ref 30.0–36.0)
MCV: 95.5 fl (ref 78.0–100.0)
Monocytes Absolute: 0.4 10*3/uL (ref 0.1–1.0)
Monocytes Relative: 5.4 % (ref 3.0–12.0)
Neutro Abs: 4.4 10*3/uL (ref 1.4–7.7)
Neutrophils Relative %: 59.8 % (ref 43.0–77.0)
Platelets: 309 10*3/uL (ref 150.0–400.0)
RBC: 4.65 Mil/uL (ref 4.22–5.81)
RDW: 13.3 % (ref 11.5–15.5)
WBC: 7.4 10*3/uL (ref 4.0–10.5)

## 2022-08-04 LAB — LIPID PANEL
Cholesterol: 187 mg/dL (ref 0–200)
HDL: 52.8 mg/dL (ref >39.00)
LDL Cholesterol: 98 mg/dL (ref 0–99)
NonHDL: 134.56
Total CHOL/HDL Ratio: 4
Triglycerides: 183 mg/dL — ABNORMAL HIGH (ref 0.0–149.0)
VLDL: 36.6 mg/dL (ref 0.0–40.0)

## 2022-08-04 LAB — BASIC METABOLIC PANEL
BUN: 16 mg/dL (ref 6–23)
CO2: 30 mEq/L (ref 19–32)
Calcium: 9.8 mg/dL (ref 8.4–10.5)
Chloride: 102 mEq/L (ref 96–112)
Creatinine, Ser: 0.76 mg/dL (ref 0.40–1.50)
GFR: 103.83 mL/min (ref >60.00)
Glucose, Bld: 98 mg/dL (ref 70–99)
Potassium: 4.2 mEq/L (ref 3.5–5.1)
Sodium: 141 mEq/L (ref 135–145)

## 2022-08-04 LAB — VITAMIN B12: Vitamin B-12: >1500 pg/mL — ABNORMAL HIGH (ref 211–911)

## 2022-08-04 LAB — TSH: TSH: 1.26 u[IU]/mL (ref 0.35–5.50)

## 2022-08-04 LAB — PSA: PSA: 0.34 ng/mL (ref 0.10–4.00)

## 2022-08-04 LAB — HEMOGLOBIN A1C: Hgb A1c MFr Bld: 5.9 % (ref 4.6–6.5)

## 2022-08-04 LAB — VITAMIN D 25 HYDROXY (VIT D DEFICIENCY, FRACTURES): VITD: 23.82 ng/mL — ABNORMAL LOW (ref 30.00–100.00)

## 2022-08-04 MED ORDER — KETOCONAZOLE 2 % EX CREA: 1.0000 | TOPICAL_CREAM | Freq: Every day | CUTANEOUS | 1 refills | Status: AC

## 2022-08-04 MED ORDER — CYANOCOBALAMIN 1000 MCG/ML IJ SOLN
1000.0000 ug | Freq: Once | INTRAMUSCULAR | Status: AC
Start: 2022-08-04 — End: 2022-08-04
  Administered 2022-08-04: 1000 ug via INTRAMUSCULAR

## 2022-08-04 NOTE — Patient Instructions (Addendum)
You had the Shingles shot #2 today  You had the B12 shot today  Please take all new medication as prescribed - the anti fungal cream for the itching   Please continue all other medications as before, and refills have been done if requested.  Please have the pharmacy call with any other refills you may need.  Please continue your efforts at being more active, low cholesterol diet, and weight control.  You are otherwise up to date with prevention measures today.  Please keep your appointments with your specialists as you may have planned  Please go to the LAB at the blood drawing area for the tests to be done  You will be contacted by phone if any changes need to be made immediately.  Otherwise, you will receive a letter about your results with an explanation, but please check with MyChart first.  Please remember to sign up for MyChart if you have not done so, as this will be important to you in the future with finding out test results, communicating by private email, and scheduling acute appointments online when needed.  Please make an Appointment to return in 6 months, or sooner if needed

## 2022-08-04 NOTE — Progress Notes (Unsigned)
Patient ID: Chad [name], male   DOB: 1970/04/09, 52 y.o.   MRN: [id number]         Chief Complaint:: wellness exam and Medical Management of Chronic Issues (Follow up and thinks he may have a belly button infection and wants routine labs today )  , hld, low vit d, b12 deficiency, anxiety       HPI:  Chad [name] is a 52 y.o. male here for wellness exam; declines referral to pulm for LDCt screening, due for shingles #2 today, o/w up to date                        Also alcohol decreased to weekends only. Feels overall improved.  Pt denies chest pain, increased sob or doe, wheezing, orthopnea, PND, increased LE swelling, palpitations, dizziness or syncope.   Pt denies polydipsia, polyuria, or new focal neuro s/s.    Pt denies fever, wt loss, night sweats, loss of appetite, or other constitutional symptoms  Due for B12 shot.  Also has rash with small off yellow non painful drainage to bellybutton area.  Denies worsening depressive symptoms, suicidal ideation, or panic;   Wt Readings from Last 3 Encounters:  08/04/22 167 lb (75.8 kg)  07/31/22 166 lb 12.8 oz (75.7 kg)  10/26/21 165 lb (74.8 kg)   BP Readings from Last 3 Encounters:  08/04/22 122/70  07/31/22 118/80  10/26/21 120/68   Immunization History  Administered Date(s) Administered   DTaP 10/26/2021   Influenza Split 12/19/2011, 03/23/2013, 12/31/2013, 04/06/2015, 01/11/2016, 12/20/2016, 12/12/2017   Influenza,inj,Quad PF,6+ Mos 03/23/2013, 12/31/2013, 04/06/2015, 01/11/2016, 12/20/2016, 12/12/2017, 12/09/2018, 04/07/2021   Influenza,inj,Quad PF,6-35 Mos 12/18/2018   Influenza,inj,quad, With Preservative 03/23/2013, 12/31/2013, 04/06/2015, 01/11/2016, 12/20/2016, 12/12/2017   PFIZER(Purple Top)SARS-COV-2 Vaccination 05/29/2019, 06/25/2019, 12/30/2019, 04/15/2021   Tdap 12/12/2017   Zoster Recombinat (Shingrix) 10/26/2021, 08/04/2022   There are no preventive care reminders to display for this patient.     Past Medical History:   Diagnosis Date   AICD (automatic cardioverter/defibrillator) present    Brugada syndrome    s/p ICD 12/07   DVT (deep venous thrombosis) (HCC)    Dysrhythmia    Echocardiogram    Echo 10/19: EF 60-65, no RWMA, mild LAE, mild TR, PASP 33   History of Carotid Doppler ultrasound    Carotid US 10/19: normal   Hyperlipidemia    Presence of permanent cardiac pacemaker    Syncope    s/p ICD 12/07   Past Surgical History:  Procedure Laterality Date   BACK SURGERY     CARDIAC DEFIBRILLATOR PLACEMENT  02/2006   St. Jude Atlas   FINGER TENDON REPAIR Left    2nd digit   ICD GENERATOR CHANGE  03/14/2016   ICD GENERATOR REMOVAL AND INSERTION OF NEW ICD, WITH LEAD  REMOVAL EXTRACTION Hattie Perch 03/14/2016   ICD LEAD REMOVAL N/A 03/14/2016   Procedure: ICD GENERATOR REMOVAL AND INSERTION OF NEW ICD, WITH LEAD  REMOVAL EXTRACTION;  Surgeon: Marinus Maw, MD;  Location: [facility] OR;  Service: Cardiovascular;  Laterality: N/A;   LUMBAR DISC SURGERY  1997   TEE WITHOUT CARDIOVERSION N/A 03/14/2016   Procedure: TRANSESOPHAGEAL ECHOCARDIOGRAM (TEE);  Surgeon: Marinus Maw, MD;  Location: [facility] OR;  Service: Cardiovascular;  Laterality: N/A;   UPPER EXTREMITY VENOGRAPHY Bilateral 05/12/2016   Procedure: Central Venography;  Surgeon: Sherren Kerns, MD;  Location: [facility] INVASIVE CV LAB;  Service: Cardiovascular;  Laterality: Bilateral;    reports that he quit smoking  about 11 years ago. His smoking use included cigarettes. He has a 23.00 pack-year smoking history. He has never used smokeless tobacco. He reports current alcohol use of about 6.0 standard drinks of alcohol per week. He reports that he does not use drugs. family history includes Diabetes in his mother; Heart disease in his mother; Hyperlipidemia in his mother. No Known Allergies Current Outpatient Medications on File Prior to Visit  Medication Sig Dispense Refill   acidophilus (RISAQUAD) CAPS capsule Take 1 capsule by mouth daily.     aspirin EC 81 MG  tablet Take 81 mg by mouth daily.     atorvastatin (LIPITOR) 20 MG tablet TAKE 1 TABLET(20 MG) BY MOUTH DAILY 90 tablet 0   metoprolol succinate (TOPROL XL) 25 MG 24 hr tablet Take 1 tablet (25 mg total) by mouth daily. 90 tablet 2   No current facility-administered medications on file prior to visit.        ROS:  All others reviewed and negative.  Objective        PE:  BP 122/70 (BP Location: Left Arm, Patient Position: Sitting, Cuff Size: Normal)   Pulse (!) 58   Temp 98.1 F (36.7 C) (Oral)   Ht 5\' 7"  (1.702 m)   Wt 167 lb (75.8 kg)   SpO2 98%   BMI 26.16 kg/m                 Constitutional: Pt appears in NAD               HENT: Head: NCAT.                Right Ear: External ear normal.                 Left Ear: External ear normal.                Eyes: . Pupils are equal, round, and reactive to light. Conjunctivae and EOM are normal               Nose: without d/c or deformity               Neck: Neck supple. Gross normal ROM               Cardiovascular: Normal rate and regular rhythm.                 Pulmonary/Chest: Effort normal and breath sounds without rales or wheezing.                Abd:  Soft, NT, ND, + BS, no organomegaly               Neurological: Pt is alert. At baseline orientation, motor grossly intact               Skin: Skin is warm. No rashes, no other new lesions, LE edema - none               Psychiatric: Pt behavior is normal without agitation   Micro: none  Cardiac tracings I have personally interpreted today:  none  Pertinent Radiological findings (summarize): none   Lab Results  Component Value Date   WBC 7.4 08/04/2022   HGB 14.4 08/04/2022   HCT 44.4 08/04/2022   PLT 309.0 08/04/2022   GLUCOSE 98 08/04/2022   CHOL 187 08/04/2022   TRIG 183.0 (H) 08/04/2022   HDL 52.80 08/04/2022   LDLDIRECT 72.0 03/20/2018   LDLCALC 98 08/04/2022  ALT 41 08/04/2022   AST 28 08/04/2022   NA 141 08/04/2022   K 4.2 08/04/2022   CL 102 08/04/2022    CREATININE 0.76 08/04/2022   BUN 16 08/04/2022   CO2 30 08/04/2022   TSH 1.26 08/04/2022   PSA 0.34 08/04/2022   HGBA1C 5.9 08/04/2022   Assessment/Plan:  Chad [name] is a 52 y.o. Asian [4] male with  has a past medical history of AICD (automatic cardioverter/defibrillator) present, Brugada syndrome, DVT (deep venous thrombosis) (HCC), Dysrhythmia, Echocardiogram, History of Carotid Doppler ultrasound, Hyperlipidemia, Presence of permanent cardiac pacemaker, and Syncope.  Encounter for well adult exam with abnormal findings Age and sex appropriate education and counseling updated with regular exercise and diet Referrals for preventative services - declines pulm referral for LDCT screening Immunizations addressed - for shingrix #2 today Smoking counseling  - none needed - quit x 11 yrs Evidence for depression or other mood disorder - none significant Most recent labs reviewed. I have personally reviewed and have noted: 1) the patient's medical and social history 2) The patient's current medications and supplements 3) The patient's height, weight, and BMI have been recorded in the chart   Anxiety Overall stable, declines need for change in tx or referral, declines SSRI  Hyperlipidemia Lab Results  Component Value Date   LDLCALC 98 08/04/2022   uncontrolled, pt to continue current statin lipitor 20 mg qd, delcines vascepa   Hyperglycemia Lab Results  Component Value Date   HGBA1C 5.9 08/04/2022   Stable, pt to continue current medical treatment  - diet, wt control   Low vitamin D level Last vitamin D Lab Results  Component Value Date   VD25OH 23.82 (L) 08/04/2022   Low, to start oral replacement   B12 deficiency Lab Results  Component Value Date   VITAMINB12 >1500 (H) 08/04/2022   Stable, cont IM replacement monthly - b12 1000 mcg  Rash Likely fungal bellybutton - for ketoconozole cr prn asd  Followup: Return in about 6 months (around 02/04/2023).  Oliver Barre,  MD 08/06/2022 2:16 PM [facility]

## 2022-08-05 ENCOUNTER — Other Ambulatory Visit: Payer: Self-pay | Admitting: Internal Medicine

## 2022-08-05 DIAGNOSIS — E782 Mixed hyperlipidemia: Secondary | ICD-10-CM

## 2022-08-06 ENCOUNTER — Encounter: Payer: Self-pay | Admitting: Internal Medicine

## 2022-08-06 DIAGNOSIS — R21 Rash and other nonspecific skin eruption: Secondary | ICD-10-CM | POA: Insufficient documentation

## 2022-08-06 NOTE — Assessment & Plan Note (Addendum)
Lab Results  Component Value Date   LDLCALC 98 08/04/2022   uncontrolled, pt to continue current statin lipitor 20 mg qd, delcines vascepa

## 2022-08-06 NOTE — Assessment & Plan Note (Signed)
Last vitamin D Lab Results  Component Value Date   VD25OH 23.82 (L) 08/04/2022   Low, to start oral replacement

## 2022-08-06 NOTE — Assessment & Plan Note (Signed)
Lab Results  Component Value Date   HGBA1C 5.9 08/04/2022   Stable, pt to continue current medical treatment  - diet, wt control

## 2022-08-06 NOTE — Assessment & Plan Note (Signed)
Likely fungal bellybutton - for ketoconozole cr prn asd

## 2022-08-06 NOTE — Assessment & Plan Note (Signed)
Overall stable, declines need for change in tx or referral, declines SSRI

## 2022-08-06 NOTE — Assessment & Plan Note (Signed)
Lab Results  Component Value Date   VITAMINB12 >1500 (H) 08/04/2022   Stable, cont IM replacement monthly - b12 1000 mcg

## 2022-08-06 NOTE — Assessment & Plan Note (Signed)
Age and sex appropriate education and counseling updated with regular exercise and diet Referrals for preventative services - declines pulm referral for LDCT screening Immunizations addressed - for shingrix #2 today Smoking counseling  - none needed - quit x 11 yrs Evidence for depression or other mood disorder - none significant Most recent labs reviewed. I have personally reviewed and have noted: 1) the patient's medical and social history 2) The patient's current medications and supplements 3) The patient's height, weight, and BMI have been recorded in the chart

## 2022-08-10 DIAGNOSIS — R42 Dizziness and giddiness: Secondary | ICD-10-CM

## 2022-08-11 DIAGNOSIS — R42 Dizziness and giddiness: Secondary | ICD-10-CM | POA: Diagnosis not present

## 2022-08-24 NOTE — Progress Notes (Signed)
Remote ICD transmission.   

## 2022-09-05 ENCOUNTER — Telehealth: Payer: Self-pay | Admitting: Internal Medicine

## 2022-09-05 ENCOUNTER — Other Ambulatory Visit: Payer: Self-pay

## 2022-09-05 DIAGNOSIS — E782 Mixed hyperlipidemia: Secondary | ICD-10-CM

## 2022-09-05 MED ORDER — ATORVASTATIN CALCIUM 20 MG PO TABS
ORAL_TABLET | ORAL | 0 refills | Status: DC
Start: 2022-09-05 — End: 2022-11-27

## 2022-09-05 NOTE — Telephone Encounter (Signed)
Refill Sent. 

## 2022-09-05 NOTE — Telephone Encounter (Signed)
Prescription Request  09/05/2022  LOV: 08/04/2022  What is the name of the medication or equipment? Atorvastatin  - Patient would like 90 days worth of medication and is completely out of his medication - he has been out for 2 days  Have you contacted your pharmacy to request a refill? Yes   Which pharmacy would you like this sent to?  [id number] [place], [place] - [address] AT [facility] OF [address] [place] [address] Phone: [phone] Fax: [phone]    Patient notified that their request is being sent to the clinical staff for review and that they should receive a response within 2 business days.   Please advise at Mobile [phone] (mobile)

## 2022-10-19 DIAGNOSIS — L308 Other specified dermatitis: Secondary | ICD-10-CM | POA: Diagnosis not present

## 2022-10-24 ENCOUNTER — Encounter: Payer: Self-pay | Admitting: Internal Medicine

## 2022-10-24 ENCOUNTER — Ambulatory Visit: Payer: 59 | Attending: Internal Medicine | Admitting: Internal Medicine

## 2022-10-24 VITALS — BP 122/80 | HR 64 | Ht 67.0 in | Wt 163.0 lb

## 2022-10-24 DIAGNOSIS — I498 Other specified cardiac arrhythmias: Secondary | ICD-10-CM

## 2022-10-24 LAB — CUP PACEART INCLINIC DEVICE CHECK
Battery Remaining Longevity: 50 mo
Brady Statistic RV Percent Paced: 0 %
Date Time Interrogation Session: 20240813170653
HighPow Impedance: 81 Ohm
Implantable Lead Connection Status: 753985
Implantable Lead Implant Date: 20180102
Implantable Lead Location: 753860
Implantable Lead Model: 7122
Implantable Pulse Generator Implant Date: 20180102
Lead Channel Impedance Value: 487.5 Ohm
Lead Channel Pacing Threshold Amplitude: 1.5 V
Lead Channel Pacing Threshold Amplitude: 1.5 V
Lead Channel Pacing Threshold Pulse Width: 0.6 ms
Lead Channel Pacing Threshold Pulse Width: 0.6 ms
Lead Channel Sensing Intrinsic Amplitude: 9.3 mV
Lead Channel Setting Pacing Amplitude: 3 V
Lead Channel Setting Pacing Pulse Width: 0.6 ms
Lead Channel Setting Sensing Sensitivity: 0.5 mV
Pulse Gen Serial Number: 1246759
Zone Setting Status: 755011

## 2022-10-24 NOTE — Progress Notes (Signed)
HPI Chad [name] returns today after a long absence from our EP clinic for ongoing evaluation of his ventricular arrhythmias s/p ICD insertion. He has a h/o a broken lead and had an extraction and insertion of a new device over 5 years ago. He developed a swollen left arm after surgery and was diagnosed with an occluded vein. Over time his left arm swelling improved. He had episodic pain and swelling in his neck, shoulder and chest on the left with has now almost resolved.  No Known Allergies   Current Outpatient Medications  Medication Sig Dispense Refill   acidophilus (RISAQUAD) CAPS capsule Take 1 capsule by mouth daily.     aspirin EC 81 MG tablet Take 81 mg by mouth daily.     atorvastatin (LIPITOR) 20 MG tablet TAKE 1 TABLET(20 MG) BY MOUTH DAILY 90 tablet 0   ketoconazole (NIZORAL) 2 % cream Apply 1 Application topically daily. 30 g 1   metoprolol succinate (TOPROL XL) 25 MG 24 hr tablet Take 1 tablet (25 mg total) by mouth daily. 90 tablet 2   No current facility-administered medications for this visit.     Past Medical History:  Diagnosis Date   AICD (automatic cardioverter/defibrillator) present    Brugada syndrome    s/p ICD 12/07   DVT (deep venous thrombosis) (HCC)    Dysrhythmia    Echocardiogram    Echo 10/19: EF 60-65, no RWMA, mild LAE, mild TR, PASP 33   History of Carotid Doppler ultrasound    Carotid US 10/19: normal   Hyperlipidemia    Presence of permanent cardiac pacemaker    Syncope    s/p ICD 12/07    ROS:   All systems reviewed and negative except as noted in the HPI.   Past Surgical History:  Procedure Laterality Date   BACK SURGERY     CARDIAC DEFIBRILLATOR PLACEMENT  02/2006   St. Jude Atlas   FINGER TENDON REPAIR Left    2nd digit   ICD GENERATOR CHANGE  03/14/2016   ICD GENERATOR REMOVAL AND INSERTION OF NEW ICD, WITH LEAD  REMOVAL EXTRACTION Hattie Perch 03/14/2016   ICD LEAD REMOVAL N/A 03/14/2016   Procedure: ICD GENERATOR REMOVAL AND  INSERTION OF NEW ICD, WITH LEAD  REMOVAL EXTRACTION;  Surgeon: Marinus Maw, MD;  Location: [facility] OR;  Service: Cardiovascular;  Laterality: N/A;   LUMBAR DISC SURGERY  1997   TEE WITHOUT CARDIOVERSION N/A 03/14/2016   Procedure: TRANSESOPHAGEAL ECHOCARDIOGRAM (TEE);  Surgeon: Marinus Maw, MD;  Location: [facility] OR;  Service: Cardiovascular;  Laterality: N/A;   UPPER EXTREMITY VENOGRAPHY Bilateral 05/12/2016   Procedure: Central Venography;  Surgeon: Sherren Kerns, MD;  Location: [facility] INVASIVE CV LAB;  Service: Cardiovascular;  Laterality: Bilateral;     Family History  Problem Relation Age of Onset   Hyperlipidemia Mother    Heart disease Mother    Diabetes Mother      Social History   Socioeconomic History   Marital status: Divorced    Spouse name: Not on file   Number of children: 2   Years of education: GED   Highest education level: Not on file  Occupational History    Employer: [employer]    Comment: [employer]  Tobacco Use   Smoking status: Former    Current packs/day: 0.00    Average packs/day: 1 pack/day for 23.0 years (23.0 ttl pk-yrs)    Types: Cigarettes    Start date: 03/13/1988  Quit date: 03/14/2011    Years since quitting: 11.6   Smokeless tobacco: Never  Vaping Use   Vaping status: Never Used  Substance and Sexual Activity   Alcohol use: Yes    Alcohol/week: 6.0 standard drinks of alcohol    Types: 6 Cans of beer per week   Drug use: No   Sexual activity: Never  Other Topics Concern   Not on file  Social History Narrative   Married   Right handed   One story home   Social Determinants of Health   Financial Resource Strain: Not on file  Food Insecurity: Not on file  Transportation Needs: Not on file  Physical Activity: Unknown (11/13/2018)   Received from [facility] visits prior to 05/13/2022., [facility] visits prior to 05/13/2022.   Exercise Vital Sign    Days of Exercise per Week: 0 days    Minutes of  Exercise per Session: Not on file  Stress: Not on file  Social Connections: Not on file  Intimate Partner Violence: Not on file     BP 122/80   Pulse 64   Ht 5\' 7"  (1.702 m)   Wt 163 lb (73.9 kg)   SpO2 96%   BMI 25.53 kg/m   Physical Exam:  Well appearing NAD HEENT: Unremarkable Neck:  No JVD, no thyromegally Lymphatics:  No adenopathy Back:  No CVA tenderness Lungs:  Clear with no wheezes HEART:  Regular rate rhythm, no murmurs, no rubs, no clicks Abd:  soft, positive bowel sounds, no organomegally, no rebound, no guarding Ext:  2 plus pulses, no edema, no cyanosis, no clubbing Skin:  No rashes no nodules Neuro:  CN II through XII intact, motor grossly intact   DEVICE  Normal device function.  See PaceArt for details.   Assess/Plan: 1. Brugada syndrome - he has had no ventricular arrhythmias since his last visit and no ICD therapies. 2. VT - he has had none since his last visit 3. ICD - his St. Jude device is working normally. 4. Arm swelling/occluded SCV - his symptoms have resolved. His left arm is essentially the same size as the right arm.   [name].D.

## 2022-10-24 NOTE — Patient Instructions (Addendum)
Medication Instructions:  Your physician recommends that you continue on your current medications as directed. Please refer to the Current Medication list given to you today.  *If you need a refill on your cardiac medications before your next appointment, please call your pharmacy*  Lab Work: None ordered.  If you have labs (blood work) drawn today and your tests are completely normal, you will receive your results only by: MyChart Message (if you have MyChart) OR A paper copy in the mail If you have any lab test that is abnormal or we need to change your treatment, we will call you to review the results.  Testing/Procedures: None ordered.  Follow-Up: At [facility], you and your health needs are our priority.  As part of our continuing mission to provide you with exceptional heart care, we have created designated Provider Care Teams.  These Care Teams include your primary Cardiologist (physician) and Advanced Practice Providers (APPs -  Physician Assistants and Nurse Practitioners) who all work together to provide you with the care you need, when you need it.  We recommend signing up for the patient portal called "MyChart".  Sign up information is provided on this After Visit Summary.  MyChart is used to connect with patients for Virtual Visits (Telemedicine).  Patients are able to view lab/test results, encounter notes, upcoming appointments, etc.  Non-urgent messages can be sent to your provider as well.   To learn more about what you can do with MyChart, go to [email or web address].    Your next appointment:   1 year(s)  The format for your next appointment:   In Person  Provider:   Lewayne Bunting, MD{or one of the following Advanced Practice Providers on your designated Care Team:   Francis Dowse, [place] Casimiro Needle "Mardelle Matte" [place], [place] Earnest Rosier, NP  Remote monitoring is used to monitor your Pacemaker/ ICD from home. This monitoring reduces the number of office visits required  to check your device to one time per year. It allows [place] to keep an eye on the functioning of your device to ensure it is working properly. You are scheduled for a device check from home on 10/31/22. You may send your transmission at any time that day. If you have a wireless device, the transmission will be sent automatically. After your physician reviews your transmission, you will receive a postcard with your next transmission date.  Important Information About Sugar

## 2022-10-31 ENCOUNTER — Ambulatory Visit (INDEPENDENT_AMBULATORY_CARE_PROVIDER_SITE_OTHER): Payer: 59

## 2022-10-31 DIAGNOSIS — I429 Cardiomyopathy, unspecified: Secondary | ICD-10-CM

## 2022-11-01 LAB — CUP PACEART REMOTE DEVICE CHECK
Battery Remaining Longevity: 46 mo
Battery Remaining Percentage: 48 %
Battery Voltage: 2.92 V
Brady Statistic RV Percent Paced: 0 %
Date Time Interrogation Session: 20240821043022
HighPow Impedance: 80 Ohm
HighPow Impedance: 80 Ohm
Implantable Lead Connection Status: 753985
Implantable Lead Implant Date: 20180102
Implantable Lead Location: 753860
Implantable Lead Model: 7122
Implantable Pulse Generator Implant Date: 20180102
Lead Channel Impedance Value: 490 Ohm
Lead Channel Pacing Threshold Amplitude: 1.5 V
Lead Channel Pacing Threshold Pulse Width: 0.6 ms
Lead Channel Sensing Intrinsic Amplitude: 9.1 mV
Lead Channel Setting Pacing Amplitude: 3 V
Lead Channel Setting Pacing Pulse Width: 0.6 ms
Lead Channel Setting Sensing Sensitivity: 0.5 mV
Pulse Gen Serial Number: 1246759
Zone Setting Status: 755011

## 2022-11-10 NOTE — Progress Notes (Signed)
Remote ICD transmission.   

## 2022-11-25 ENCOUNTER — Other Ambulatory Visit: Payer: Self-pay | Admitting: Internal Medicine

## 2022-11-25 DIAGNOSIS — E782 Mixed hyperlipidemia: Secondary | ICD-10-CM

## 2022-11-27 ENCOUNTER — Other Ambulatory Visit: Payer: Self-pay

## 2023-01-30 ENCOUNTER — Ambulatory Visit (INDEPENDENT_AMBULATORY_CARE_PROVIDER_SITE_OTHER): Payer: 59

## 2023-01-30 DIAGNOSIS — R55 Syncope and collapse: Secondary | ICD-10-CM

## 2023-01-30 DIAGNOSIS — I429 Cardiomyopathy, unspecified: Secondary | ICD-10-CM

## 2023-01-31 LAB — CUP PACEART REMOTE DEVICE CHECK
Battery Remaining Longevity: 44 mo
Battery Remaining Percentage: 46 %
Battery Voltage: 2.92 V
Brady Statistic RV Percent Paced: 0 %
Date Time Interrogation Session: 20241119034207
HighPow Impedance: 81 Ohm
HighPow Impedance: 81 Ohm
Implantable Lead Connection Status: 753985
Implantable Lead Implant Date: 20180102
Implantable Lead Location: 753860
Implantable Lead Model: 7122
Implantable Pulse Generator Implant Date: 20180102
Lead Channel Impedance Value: 480 Ohm
Lead Channel Pacing Threshold Amplitude: 1.5 V
Lead Channel Pacing Threshold Pulse Width: 0.6 ms
Lead Channel Sensing Intrinsic Amplitude: 7.6 mV
Lead Channel Setting Pacing Amplitude: 3 V
Lead Channel Setting Pacing Pulse Width: 0.6 ms
Lead Channel Setting Sensing Sensitivity: 0.5 mV
Pulse Gen Serial Number: 1246759
Zone Setting Status: 755011

## 2023-02-03 DIAGNOSIS — T7840XA Allergy, unspecified, initial encounter: Secondary | ICD-10-CM | POA: Diagnosis not present

## 2023-02-05 ENCOUNTER — Ambulatory Visit (INDEPENDENT_AMBULATORY_CARE_PROVIDER_SITE_OTHER): Payer: 59 | Admitting: Internal Medicine

## 2023-02-05 ENCOUNTER — Encounter: Payer: Self-pay | Admitting: Internal Medicine

## 2023-02-05 VITALS — BP 110/64 | HR 60 | Temp 98.4°F | Ht 67.0 in | Wt 172.0 lb

## 2023-02-05 DIAGNOSIS — R7989 Other specified abnormal findings of blood chemistry: Secondary | ICD-10-CM

## 2023-02-05 DIAGNOSIS — R739 Hyperglycemia, unspecified: Secondary | ICD-10-CM | POA: Diagnosis not present

## 2023-02-05 DIAGNOSIS — L509 Urticaria, unspecified: Secondary | ICD-10-CM | POA: Diagnosis not present

## 2023-02-05 DIAGNOSIS — Z23 Encounter for immunization: Secondary | ICD-10-CM | POA: Diagnosis not present

## 2023-02-05 DIAGNOSIS — E782 Mixed hyperlipidemia: Secondary | ICD-10-CM

## 2023-02-05 DIAGNOSIS — E538 Deficiency of other specified B group vitamins: Secondary | ICD-10-CM

## 2023-02-05 MED ORDER — METHYLPREDNISOLONE ACETATE 80 MG/ML IJ SUSP
80.0000 mg | Freq: Once | INTRAMUSCULAR | Status: AC
  Administered 2023-02-05: 80 mg via INTRAMUSCULAR

## 2023-02-05 NOTE — Progress Notes (Unsigned)
Patient ID: Chad [name], male   DOB: [birth year], 52 y.o.   MRN: [id number]        Chief Complaint: follow up hives, low vit d, hld, hyperglycemia, low b12       HPI:  Chad [name] is a 52 y.o. male here with 3 days onset marked hives rash to torso and extremities, mild intermittent but marked itching.  Pt denies chest pain, increased sob or doe, wheezing, orthopnea, PND, increased LE swelling, palpitations, dizziness or syncope.   Pt denies polydipsia, polyuria, or new focal neuro s/s.    Pt denies fever, wt loss, night sweats, loss of appetite, or other constitutional symptoms  Due for flu shot       Wt Readings from Last 3 Encounters:  02/05/23 172 lb (78 kg)  10/24/22 163 lb (73.9 kg)  08/04/22 167 lb (75.8 kg)   BP Readings from Last 3 Encounters:  02/05/23 110/64  10/24/22 122/80  08/04/22 122/70         Past Medical History:  Diagnosis Date   AICD (automatic cardioverter/defibrillator) present    Brugada syndrome    s/p ICD 12/07   DVT (deep venous thrombosis) (HCC)    Dysrhythmia    Echocardiogram    Echo 10/19: EF 60-65, no RWMA, mild LAE, mild TR, PASP 33   History of Carotid Doppler ultrasound    Carotid US 10/19: normal   Hyperlipidemia    Presence of permanent cardiac pacemaker    Syncope    s/p ICD 12/07   Past Surgical History:  Procedure Laterality Date   BACK SURGERY     CARDIAC DEFIBRILLATOR PLACEMENT  02/2006   St. Jude Atlas   FINGER TENDON REPAIR Left    2nd digit   ICD GENERATOR CHANGE  03/14/2016   ICD GENERATOR REMOVAL AND INSERTION OF NEW ICD, WITH LEAD  REMOVAL EXTRACTION Hattie Perch 03/14/2016   ICD LEAD REMOVAL N/A 03/14/2016   Procedure: ICD GENERATOR REMOVAL AND INSERTION OF NEW ICD, WITH LEAD  REMOVAL EXTRACTION;  Surgeon: Marinus Maw, MD;  Location: [facility] OR;  Service: Cardiovascular;  Laterality: N/A;   LUMBAR DISC SURGERY  1997   TEE WITHOUT CARDIOVERSION N/A 03/14/2016   Procedure: TRANSESOPHAGEAL ECHOCARDIOGRAM (TEE);  Surgeon: Marinus Maw, MD;   Location: [facility] OR;  Service: Cardiovascular;  Laterality: N/A;   UPPER EXTREMITY VENOGRAPHY Bilateral 05/12/2016   Procedure: Central Venography;  Surgeon: Sherren Kerns, MD;  Location: [facility] INVASIVE CV LAB;  Service: Cardiovascular;  Laterality: Bilateral;    reports that he quit smoking about 11 years ago. His smoking use included cigarettes. He started smoking about 34 years ago. He has a 23 pack-year smoking history. He has never used smokeless tobacco. He reports current alcohol use of about 6.0 standard drinks of alcohol per week. He reports that he does not use drugs. family history includes Diabetes in his mother; Heart disease in his mother; Hyperlipidemia in his mother. No Known Allergies Current Outpatient Medications on File Prior to Visit  Medication Sig Dispense Refill   acidophilus (RISAQUAD) CAPS capsule Take 1 capsule by mouth daily.     aspirin EC 81 MG tablet Take 81 mg by mouth daily.     atorvastatin (LIPITOR) 20 MG tablet TAKE 1 TABLET BY MOUTH EVERY DAY 90 tablet 3   diphenhydrAMINE (BENADRYL) 25 mg capsule Take 25 mg by mouth every 6 (six) hours as needed.     ketoconazole (NIZORAL) 2 % cream Apply 1 Application topically daily. 30 g  1   methylPREDNISolone (MEDROL DOSEPAK) 4 MG TBPK tablet Take by mouth.     metoprolol succinate (TOPROL XL) 25 MG 24 hr tablet Take 1 tablet (25 mg total) by mouth daily. 90 tablet 2   No current facility-administered medications on file prior to visit.        ROS:  All others reviewed and negative.  Objective        PE:  BP 110/64 (BP Location: Right Arm, Patient Position: Sitting, Cuff Size: Normal)   Pulse 60   Temp 98.4 F (36.9 C) (Oral)   Ht 5\' 7"  (1.702 m)   Wt 172 lb (78 kg)   SpO2 98%   BMI 26.94 kg/m                 Constitutional: Pt appears in NAD               HENT: Head: NCAT.                Right Ear: External ear normal.                 Left Ear: External ear normal.                Eyes: . Pupils are equal,  round, and reactive to light. Conjunctivae and EOM are normal               Nose: without d/c or deformity               Neck: Neck supple. Gross normal ROM               Cardiovascular: Normal rate and regular rhythm.                 Pulmonary/Chest: Effort normal and breath sounds without rales or wheezing.                Abd:  Soft, NT, ND, + BS, no organomegaly               Neurological: Pt is alert. At baseline orientation, motor grossly intact               Skin: Skin is warm., LE edema - none, has diffuse wheal and flare lesions to torso and extremities               Psychiatric: Pt behavior is normal without agitation   Micro: none  Cardiac tracings I have personally interpreted today:  none  Pertinent Radiological findings (summarize): none   Lab Results  Component Value Date   WBC 7.4 08/04/2022   HGB 14.4 08/04/2022   HCT 44.4 08/04/2022   PLT 309.0 08/04/2022   GLUCOSE 98 08/04/2022   CHOL 187 08/04/2022   TRIG 183.0 (H) 08/04/2022   HDL 52.80 08/04/2022   LDLDIRECT 72.0 03/20/2018   LDLCALC 98 08/04/2022   ALT 41 08/04/2022   AST 28 08/04/2022   NA 141 08/04/2022   K 4.2 08/04/2022   CL 102 08/04/2022   CREATININE 0.76 08/04/2022   BUN 16 08/04/2022   CO2 30 08/04/2022   TSH 1.26 08/04/2022   PSA 0.34 08/04/2022   HGBA1C 5.9 08/04/2022   Assessment/Plan:  Chad [name] is a 52 y.o. Asian [4] male with  has a past medical history of AICD (automatic cardioverter/defibrillator) present, Brugada syndrome, DVT (deep venous thrombosis) (HCC), Dysrhythmia, Echocardiogram, History of Carotid Doppler ultrasound, Hyperlipidemia, Presence of permanent cardiac pacemaker, and Syncope.  Low  vitamin D level Last vitamin D Lab Results  Component Value Date   VD25OH 23.82 (L) 08/04/2022   Low, to start oral replacement   Hyperlipidemia Lab Results  Component Value Date   LDLCALC 98 08/04/2022   Stable, pt to cont low chol diet, cont lipitor 20  qd   Hyperglycemia Lab Results  Component Value Date   HGBA1C 5.9 08/04/2022   Stable, pt to continue current medical treatment  - diet, wt control   B12 deficiency Lab Results  Component Value Date   VITAMINB12 >1500 (H) 08/04/2022   Stable, cont oral replacement - b12 1000 mcg qd   Hives Mild to mod, for depomedrol 80 mg IM, prednisone taper,,  to f/u any worsening symptoms or concerns  Followup: Return in about 6 months (around 08/05/2023).  Oliver Barre, MD 02/07/2023 7:54 PM [facility]

## 2023-02-05 NOTE — Patient Instructions (Signed)
You had the flu shot today  You had the steroid shot today  Ok to also to take OTC Zyrtec 10 mg per day as needed  Please continue all other medications as before, and refills have been done if requested.  Please have the pharmacy call with any other refills you may need.  Please continue your efforts at being more active, low cholesterol diet, and weight control..  Please keep your appointments with your specialists as you may have planned  Please make an Appointment to return in 6 months, or sooner if needed

## 2023-02-07 ENCOUNTER — Encounter: Payer: Self-pay | Admitting: Internal Medicine

## 2023-02-07 DIAGNOSIS — L509 Urticaria, unspecified: Secondary | ICD-10-CM | POA: Insufficient documentation

## 2023-02-07 NOTE — Assessment & Plan Note (Addendum)
Lab Results  Component Value Date   LDLCALC 98 08/04/2022   Stable, pt to cont low chol diet, cont lipitor 20 qd

## 2023-02-07 NOTE — Assessment & Plan Note (Signed)
Mild to mod, for depomedrol 80 mg IM, prednisone taper,,  to f/u any worsening symptoms or concerns

## 2023-02-07 NOTE — Assessment & Plan Note (Signed)
Lab Results  Component Value Date   VITAMINB12 >1500 (H) 08/04/2022   Stable, cont oral replacement - b12 1000 mcg qd

## 2023-02-07 NOTE — Assessment & Plan Note (Signed)
Lab Results  Component Value Date   HGBA1C 5.9 08/04/2022   Stable, pt to continue current medical treatment  - diet, wt control

## 2023-02-07 NOTE — Assessment & Plan Note (Signed)
Last vitamin D Lab Results  Component Value Date   VD25OH 23.82 (L) 08/04/2022   Low, to start oral replacement

## 2023-02-26 NOTE — Addendum Note (Signed)
Addended by: Geralyn Flash D on: 02/26/2023 01:41 PM   Modules accepted: Orders

## 2023-02-26 NOTE — Progress Notes (Signed)
Remote ICD transmission.   

## 2023-03-30 ENCOUNTER — Other Ambulatory Visit: Payer: Self-pay | Admitting: Physician Assistant

## 2023-04-24 ENCOUNTER — Telehealth: Payer: Self-pay

## 2023-04-24 DIAGNOSIS — Z95810 Presence of automatic (implantable) cardiac defibrillator: Secondary | ICD-10-CM

## 2023-04-24 NOTE — Telephone Encounter (Signed)
Alert remote transmission: Shock -1 VF zone event on 2/9 @ 2340 x 28 sec with V-rate 260 bpm treated with ATP x1 and shock x1.  Irregular R-R with variable R wave morphology.  Sending for review. -5 SVT, V-rates 160s, longest x 58 sec.  Regular R-R.   -23 NSVT, longest x 24 sec, V-rates 160-247 bpm.  No EGMs to view.   Follow up as scheduled. [place], CVRS   After review of episode that received therapy I am unable to determine initiation as it may have been out of detection NOR does the HVT delivered terminate the ventricular rhythm; however, the morphology changes into a distinctly different monomorphic wide complex tachycardia below detection.   Called pt. Was returning home from out of town. [place] and he felt palpitations, dizziness, fatigue. After he got shocked he felt very fatigued and progressively felt better and has been asymptomatic since. He is compliant with his medications. The only ones he is taking right now is ASA 81 daily, lipitor 20mg  daily and metop succ 25mg  Daily. He is not currently taking methylprednisolone. Sending to MD for review. I attached VF/VT episode w/ treatment. I also attached an regular irregular appearing SVT classified EGM.              Second Episode

## 2023-04-24 NOTE — Telephone Encounter (Signed)
BMET Mag ordered. Going to [facility] tomorrow. Scheduled for next available w/ RU. Told EP scheduler if anything sooner opens up to try to move up this pts appointment.

## 2023-04-26 ENCOUNTER — Other Ambulatory Visit: Payer: Self-pay

## 2023-04-26 DIAGNOSIS — Z95810 Presence of automatic (implantable) cardiac defibrillator: Secondary | ICD-10-CM

## 2023-04-26 NOTE — Progress Notes (Unsigned)
Electrophysiology Office Note:   Date:  04/27/2023  ID:  Chad [name], DOB Jul 09, 1970, MRN [id number]  Primary Cardiologist: Lewayne Bunting, MD Primary Heart Failure: None Electrophysiologist: Lewayne Bunting, MD       History of Present Illness:   Chad [name] is a 53 y.o. male with h/o Brugada syndrome, chronic left axillary DVT seen today for acute visit due to ICD shock.    Remote monitoring altered of HV therapy on 04/22/23 at 2340 > episode lasted 28 sec with V -rate of 260 bpm which was treated with ATP x1, then shock x1. EGM review shows irregular R-R.  There were 5 SVT episodes and 23 NSVT longest lasting 24 seconds (no EGM for review). The patient was called and he reported [place] the airport.  After the shock, he felt dizziness, fatigue and palpitations.  He reported medication compliance.  After the shock, he progressively began to feel better.  He currently reports he felt funny before his shock at the airport. He noted a sensation in his chest that felt almost like when he paces but much more intense. He took an extra metoprolol but was almost immediately shocked after swallowing the medication.  He reports he felt back to normal after the shock.  He has been compliant with medications, no swelling or other acute concerns.  He denies chest pain, palpitations, dyspnea, PND, orthopnea, nausea, vomiting, dizziness, syncope, edema, weight gain, or early satiety.   Review of systems complete and found to be negative unless listed in HPI.    EP Information / Studies Reviewed:    EKG is ordered today. Personal review as below.  EKG Interpretation Date/Time:  Friday April 27 2023 08:43:57 EST Ventricular Rate:  60 PR Interval:  186 QRS Duration:  126 QT Interval:  420 QTC Calculation: 420 R Axis:   -13  Text Interpretation: Normal sinus rhythm Non-specific intra-ventricular conduction block Confirmed by Canary Brim ([id number]) on 04/27/2023 8:54:01 AM   ICD Interrogation-  reviewed in  detail today,  See PACEART report.  Device History: Abbott Single Chamber ICD implanted 03/14/2016 for syncope, NICM RV lead failure with evidence of externalization, system extracted 03/14/2016 / revised  History of appropriate therapy: No, inappropriate shock for atrial arrhythmia 04/22/23 History of AAD therapy: No   Studies:  ECHO 12/2017 > LVEF 60-65%, LA mildly dilated  Cardiac Monitor 08/2022 > NSR with rare PAC's/PVC's. No VT, SVT or AF   Arrhythmia / AAD VT    Physical Exam:   VS:  BP (!) 120/90   Pulse 60   Ht 5\' 7"  (1.702 m)   Wt 170 lb 6.4 oz (77.3 kg)   SpO2 97%   BMI 26.69 kg/m    Wt Readings from Last 3 Encounters:  04/27/23 170 lb 6.4 oz (77.3 kg)  02/05/23 172 lb (78 kg)  10/24/22 163 lb (73.9 kg)     GEN: Well nourished, well developed in no acute distress NECK: No JVD; No carotid bruits CARDIAC: Regular rate and rhythm, no murmurs, rubs, gallops, device site without edema/erythema or tethering RESPIRATORY:  Clear to auscultation without rales, wheezing or rhonchi  ABDOMEN: Soft, non-tender, non-distended EXTREMITIES:  No edema; No deformity   ASSESSMENT AND PLAN:    Brugada Syndrome with VT s/p Abbott single chamber ICD  -euvolemic today -Stable on an appropriate medical regimen -Normal ICD function -See Pace Art report -No changes today  SVT  -increase Toprol to 50mg  daily -discriminators do not clearly match for VT, narrow, more  likely atrial in origin > is irregular at times. On EGM of 2/9 for shock episode, there is an area with ~10-12 beats where discriminators match and there is a clear morphology change that may be a dual tachycardia, but otherwise appears to be atrial AF / SVT  -recent BMP with K+ 4.3, Mg+ 2.4  Disposition:   Follow up with Dr. Ladona Ridgel in 3 months   Signed, Canary Brim, NP-C, AGACNP-BC [facility] - Electrophysiology  04/27/2023, 1:25 PM

## 2023-04-27 ENCOUNTER — Encounter: Payer: Self-pay | Admitting: Pulmonary Disease

## 2023-04-27 ENCOUNTER — Ambulatory Visit: Payer: No Typology Code available for payment source | Attending: Pulmonary Disease | Admitting: Pulmonary Disease

## 2023-04-27 VITALS — BP 120/90 | HR 60 | Ht 67.0 in | Wt 170.4 lb

## 2023-04-27 DIAGNOSIS — I471 Supraventricular tachycardia, unspecified: Secondary | ICD-10-CM

## 2023-04-27 DIAGNOSIS — Z95810 Presence of automatic (implantable) cardiac defibrillator: Secondary | ICD-10-CM

## 2023-04-27 DIAGNOSIS — I429 Cardiomyopathy, unspecified: Secondary | ICD-10-CM | POA: Diagnosis not present

## 2023-04-27 DIAGNOSIS — I498 Other specified cardiac arrhythmias: Secondary | ICD-10-CM

## 2023-04-27 LAB — BASIC METABOLIC PANEL
BUN/Creatinine Ratio: 14 (ref 9–20)
BUN: 12 mg/dL (ref 6–24)
CO2: 23 mmol/L (ref 20–29)
Calcium: 9.8 mg/dL (ref 8.7–10.2)
Chloride: 102 mmol/L (ref 96–106)
Creatinine, Ser: 0.87 mg/dL (ref 0.76–1.27)
Glucose: 97 mg/dL (ref 70–99)
Potassium: 4.3 mmol/L (ref 3.5–5.2)
Sodium: 143 mmol/L (ref 134–144)
eGFR: 104 mL/min/{1.73_m2} (ref >59)

## 2023-04-27 LAB — CUP PACEART INCLINIC DEVICE CHECK
Date Time Interrogation Session: 20250214115522
Implantable Lead Connection Status: 753985
Implantable Lead Implant Date: 20180102
Implantable Lead Location: 753860
Implantable Lead Model: 7122
Implantable Pulse Generator Implant Date: 20180102
Pulse Gen Serial Number: 1246759

## 2023-04-27 LAB — MAGNESIUM: Magnesium: 2.4 mg/dL — ABNORMAL HIGH (ref 1.6–2.3)

## 2023-04-27 MED ORDER — METOPROLOL SUCCINATE ER 50 MG PO TB24: 50.0000 mg | ORAL_TABLET | Freq: Every day | ORAL | 3 refills | Status: DC

## 2023-04-27 NOTE — Patient Instructions (Signed)
Medication Instructions:   START TAKING :  METOPROLOL 50 MG ONCE A DAY   *If you need a refill on your cardiac medications before your next appointment, please call your pharmacy*   Lab Work:  NONE ORDERED  TODAY    If you have labs (blood work) drawn today and your tests are completely normal, you will receive your results only by: MyChart Message (if you have MyChart) OR A paper copy in the mail If you have any lab test that is abnormal or we need to change your treatment, we will call you to review the results.    Testing/Procedures: NONE ORDERED  TODAY     Follow-Up: At [facility] you and your health needs are our priority.  As part of our continuing mission to provide you with exceptional heart care, we have created designated Provider Care Teams.  These Care Teams include your primary Cardiologist (physician) and Advanced Practice Providers (APPs -  Physician Assistants and Nurse Practitioners) who all work together to provide you with the care you need, when you need it.  We recommend signing up for the patient portal called "MyChart".  Sign up information is provided on this After Visit Summary.  MyChart is used to connect with patients for Virtual Visits (Telemedicine).  Patients are able to view lab/test results, encounter notes, upcoming appointments, etc.  Non-urgent messages can be sent to your provider as well.   To learn more about what you can do with MyChart, go to [email or web address].    Your next appointment:   3 month(s)  ( CONTACT  CASSIE HALL/ ANGELINE HAMMER FOR EP SCHEDULING ISSUES )  Provider:    You may see Lewayne Bunting, MD ONLY!!   Other Instructions

## 2023-05-01 ENCOUNTER — Ambulatory Visit (INDEPENDENT_AMBULATORY_CARE_PROVIDER_SITE_OTHER): Payer: 59

## 2023-05-01 DIAGNOSIS — I429 Cardiomyopathy, unspecified: Secondary | ICD-10-CM

## 2023-05-04 LAB — CUP PACEART REMOTE DEVICE CHECK
Battery Remaining Longevity: 42 mo
Battery Remaining Percentage: 43 %
Battery Voltage: 2.92 V
Brady Statistic RV Percent Paced: 0 %
Date Time Interrogation Session: 20250221041855
HighPow Impedance: 84 Ohm
HighPow Impedance: 84 Ohm
Implantable Lead Connection Status: 753985
Implantable Lead Implant Date: 20180102
Implantable Lead Location: 753860
Implantable Lead Model: 7122
Implantable Pulse Generator Implant Date: 20180102
Lead Channel Impedance Value: 480 Ohm
Lead Channel Pacing Threshold Amplitude: 1.5 V
Lead Channel Pacing Threshold Pulse Width: 0.6 ms
Lead Channel Sensing Intrinsic Amplitude: 11.8 mV
Lead Channel Setting Pacing Amplitude: 3 V
Lead Channel Setting Pacing Pulse Width: 0.6 ms
Lead Channel Setting Sensing Sensitivity: 0.5 mV
Pulse Gen Serial Number: 1246759
Zone Setting Status: 755011

## 2023-05-08 ENCOUNTER — Encounter: Payer: 59 | Admitting: Physician Assistant

## 2023-06-07 NOTE — Addendum Note (Signed)
 Addended by: Elease Etienne A on: 06/07/2023 01:00 PM   Modules accepted: Orders

## 2023-06-07 NOTE — Progress Notes (Signed)
 Remote ICD transmission.

## 2023-07-31 ENCOUNTER — Ambulatory Visit (INDEPENDENT_AMBULATORY_CARE_PROVIDER_SITE_OTHER): Payer: 59

## 2023-07-31 DIAGNOSIS — I429 Cardiomyopathy, unspecified: Secondary | ICD-10-CM

## 2023-08-01 ENCOUNTER — Ambulatory Visit: Payer: Self-pay | Admitting: Internal Medicine

## 2023-08-01 LAB — CUP PACEART REMOTE DEVICE CHECK
Battery Remaining Longevity: 40 mo
Battery Remaining Percentage: 41 %
Battery Voltage: 2.9 V
Brady Statistic RV Percent Paced: 1 %
Date Time Interrogation Session: 20250521055538
HighPow Impedance: 75 Ohm
HighPow Impedance: 75 Ohm
Implantable Lead Connection Status: 753985
Implantable Lead Implant Date: 20180102
Implantable Lead Location: 753860
Implantable Lead Model: 7122
Implantable Pulse Generator Implant Date: 20180102
Lead Channel Impedance Value: 460 Ohm
Lead Channel Pacing Threshold Amplitude: 1.5 V
Lead Channel Pacing Threshold Pulse Width: 0.6 ms
Lead Channel Sensing Intrinsic Amplitude: 8.8 mV
Lead Channel Setting Pacing Amplitude: 3 V
Lead Channel Setting Pacing Pulse Width: 0.6 ms
Lead Channel Setting Sensing Sensitivity: 0.5 mV
Pulse Gen Serial Number: 1246759
Zone Setting Status: 755011

## 2023-08-07 ENCOUNTER — Ambulatory Visit: Payer: 59 | Admitting: Internal Medicine

## 2023-08-09 ENCOUNTER — Encounter: Payer: Self-pay | Admitting: Internal Medicine

## 2023-08-09 ENCOUNTER — Ambulatory Visit: Payer: No Typology Code available for payment source | Attending: Internal Medicine | Admitting: Internal Medicine

## 2023-08-09 VITALS — BP 114/86 | HR 62 | Ht 67.0 in | Wt 171.8 lb

## 2023-08-09 DIAGNOSIS — I498 Other specified cardiac arrhythmias: Secondary | ICD-10-CM

## 2023-08-09 LAB — CUP PACEART INCLINIC DEVICE CHECK
Date Time Interrogation Session: 20250529101631
Implantable Lead Connection Status: 753985
Implantable Lead Implant Date: 20180102
Implantable Lead Location: 753860
Implantable Lead Model: 7122
Implantable Pulse Generator Implant Date: 20180102
Pulse Gen Serial Number: 1246759

## 2023-08-09 NOTE — Progress Notes (Signed)
 HPI Chad [name] returns today after a long absence from our EP clinic for ongoing evaluation of his ventricular arrhythmias s/p ICD insertion. He had a h/o a broken lead and had an extraction and insertion of a new device over 6 years ago. He developed a swollen left arm after surgery and was diagnosed with an occluded vein. Over time his left arm swelling improved. He had episodic pain and swelling in his neck, shoulder and chest on the left with has now resolved. He denies palpitations but has NSVT.  No Known Allergies   Current Outpatient Medications  Medication Sig Dispense Refill   aspirin  EC 81 MG tablet Take 81 mg by mouth daily.     atorvastatin  (LIPITOR) 20 MG tablet TAKE 1 TABLET BY MOUTH EVERY DAY 90 tablet 3   ketoconazole  (NIZORAL ) 2 % cream Apply 1 Application topically daily. 30 g 1   metoprolol  succinate (TOPROL -XL) 50 MG 24 hr tablet Take 1 tablet (50 mg total) by mouth daily. 90 tablet 3   No current facility-administered medications for this visit.     Past Medical History:  Diagnosis Date   AICD (automatic cardioverter/defibrillator) present    Brugada syndrome    s/p ICD 12/07   DVT (deep venous thrombosis) (HCC)    Dysrhythmia    Echocardiogram    Echo 10/19: EF 60-65, no RWMA, mild LAE, mild TR, PASP 33   History of Carotid Doppler ultrasound    Carotid US  10/19: normal   Hyperlipidemia    Presence of permanent cardiac pacemaker    Syncope    s/p ICD 12/07    ROS:   All systems reviewed and negative except as noted in the HPI.   Past Surgical History:  Procedure Laterality Date   BACK SURGERY     CARDIAC DEFIBRILLATOR PLACEMENT  02/2006   St. Jude Atlas   FINGER TENDON REPAIR Left    2nd digit   ICD GENERATOR CHANGE  03/14/2016   ICD GENERATOR REMOVAL AND INSERTION OF NEW ICD, WITH LEAD  REMOVAL EXTRACTION Maximo Spar 03/14/2016   ICD LEAD REMOVAL N/A 03/14/2016   Procedure: ICD GENERATOR REMOVAL AND INSERTION OF NEW ICD, WITH LEAD  REMOVAL  EXTRACTION;  Surgeon: Tammie Fall, MD;  Location: [facility] OR;  Service: Cardiovascular;  Laterality: N/A;   LUMBAR DISC SURGERY  1997   TEE WITHOUT CARDIOVERSION N/A 03/14/2016   Procedure: TRANSESOPHAGEAL ECHOCARDIOGRAM (TEE);  Surgeon: Tammie Fall, MD;  Location: [facility] OR;  Service: Cardiovascular;  Laterality: N/A;   UPPER EXTREMITY VENOGRAPHY Bilateral 05/12/2016   Procedure: Central Venography;  Surgeon: Richrd Char, MD;  Location: [facility] INVASIVE CV LAB;  Service: Cardiovascular;  Laterality: Bilateral;     Family History  Problem Relation Age of Onset   Hyperlipidemia Mother    Heart disease Mother    Diabetes Mother      Social History   Socioeconomic History   Marital status: Divorced    Spouse name: Not on file   Number of children: 2   Years of education: GED   Highest education level: Not on file  Occupational History    Employer: [employer]    Comment: [employer]  Tobacco Use   Smoking status: Former    Current packs/day: 0.00    Average packs/day: 1 pack/day for 23.0 years (23.0 ttl pk-yrs)    Types: Cigarettes    Start date: 03/13/1988    Quit date: 03/14/2011    Years since quitting:  12.4   Smokeless tobacco: Never  Vaping Use   Vaping status: Never Used  Substance and Sexual Activity   Alcohol use: Yes    Alcohol/week: 6.0 standard drinks of alcohol    Types: 6 Cans of beer per week   Drug use: No   Sexual activity: Never  Other Topics Concern   Not on file  Social History Narrative   Married   Right handed   One story home   Social Drivers of Health   Financial Resource Strain: Not on file  Food Insecurity: Not on file  Transportation Needs: Not on file  Physical Activity: Unknown (11/13/2018)   Received from [facility] visits prior to 05/13/2022., [facility] visits prior to 05/13/2022.   Exercise Vital Sign    Days of Exercise per Week: 0 days    Minutes of Exercise per Session: Not on file  Stress: Not on  file  Social Connections: Not on file  Intimate Partner Violence: Not on file     BP 114/86   Pulse 62   Ht 5\' 7"  (1.702 m)   Wt 171 lb 12.8 oz (77.9 kg)   SpO2 98%   BMI 26.91 kg/m   Physical Exam:  Well appearing NAD HEENT: Unremarkable Neck:  No JVD, no thyromegally Lymphatics:  No adenopathy Back:  No CVA tenderness Lungs:  Clear with no wheezes HEART:  Regular rate rhythm, no murmurs, no rubs, no clicks Abd:  soft, positive bowel sounds, no organomegally, no rebound, no guarding Ext:  2 plus pulses, no edema, no cyanosis, no clubbing Skin:  No rashes no nodules Neuro:  CN II through XII intact, motor grossly intact  DEVICE  Normal device function.  See PaceArt for details.   Assess/Plan:  1. Brugada syndrome - he has had no sustained ventricular arrhythmias since his last visit and no ICD therapies. 2. VT - he has had some NSVT but is asymptomatic. He will undergo watchful waiting. Continue toprol  for now. 3. ICD - his St. Jude device is working normally.   His left arm is essentially the same size as the right arm.   Debbi Failing.D.

## 2023-08-09 NOTE — Patient Instructions (Signed)

## 2023-09-25 NOTE — Progress Notes (Signed)
 Remote ICD transmission.

## 2023-09-25 NOTE — Addendum Note (Signed)
 Addended by: VICCI SELLER A on: 09/25/2023 09:20 AM   Modules accepted: Orders

## 2023-09-27 ENCOUNTER — Ambulatory Visit: Payer: Self-pay | Admitting: Internal Medicine

## 2023-09-27 ENCOUNTER — Ambulatory Visit (INDEPENDENT_AMBULATORY_CARE_PROVIDER_SITE_OTHER)

## 2023-09-27 ENCOUNTER — Ambulatory Visit (INDEPENDENT_AMBULATORY_CARE_PROVIDER_SITE_OTHER): Admitting: Internal Medicine

## 2023-09-27 ENCOUNTER — Encounter: Payer: Self-pay | Admitting: Internal Medicine

## 2023-09-27 ENCOUNTER — Other Ambulatory Visit: Payer: Self-pay | Admitting: Internal Medicine

## 2023-09-27 VITALS — BP 120/74 | HR 66 | Temp 97.9°F | Ht 67.0 in | Wt 166.0 lb

## 2023-09-27 DIAGNOSIS — E538 Deficiency of other specified B group vitamins: Secondary | ICD-10-CM | POA: Diagnosis not present

## 2023-09-27 DIAGNOSIS — R739 Hyperglycemia, unspecified: Secondary | ICD-10-CM | POA: Diagnosis not present

## 2023-09-27 DIAGNOSIS — M542 Cervicalgia: Secondary | ICD-10-CM

## 2023-09-27 DIAGNOSIS — E559 Vitamin D deficiency, unspecified: Secondary | ICD-10-CM | POA: Diagnosis not present

## 2023-09-27 DIAGNOSIS — Z125 Encounter for screening for malignant neoplasm of prostate: Secondary | ICD-10-CM

## 2023-09-27 DIAGNOSIS — Z0001 Encounter for general adult medical examination with abnormal findings: Secondary | ICD-10-CM

## 2023-09-27 DIAGNOSIS — R1310 Dysphagia, unspecified: Secondary | ICD-10-CM

## 2023-09-27 DIAGNOSIS — R7989 Other specified abnormal findings of blood chemistry: Secondary | ICD-10-CM

## 2023-09-27 DIAGNOSIS — F1010 Alcohol abuse, uncomplicated: Secondary | ICD-10-CM

## 2023-09-27 DIAGNOSIS — E782 Mixed hyperlipidemia: Secondary | ICD-10-CM

## 2023-09-27 LAB — CBC WITH DIFFERENTIAL/PLATELET
Basophils Absolute: 0 K/uL (ref 0.0–0.1)
Basophils Relative: 0.6 % (ref 0.0–3.0)
Eosinophils Absolute: 0.1 K/uL (ref 0.0–0.7)
Eosinophils Relative: 1.4 % (ref 0.0–5.0)
HCT: 46 % (ref 39.0–52.0)
Hemoglobin: 15.5 g/dL (ref 13.0–17.0)
Lymphocytes Relative: 51.9 % — ABNORMAL HIGH (ref 12.0–46.0)
Lymphs Abs: 2.7 K/uL (ref 0.7–4.0)
MCHC: 33.6 g/dL (ref 30.0–36.0)
MCV: 93.8 fl (ref 78.0–100.0)
Monocytes Absolute: 0.4 K/uL (ref 0.1–1.0)
Monocytes Relative: 7.3 % (ref 3.0–12.0)
Neutro Abs: 2 K/uL (ref 1.4–7.7)
Neutrophils Relative %: 38.8 % — ABNORMAL LOW (ref 43.0–77.0)
Platelets: 353 K/uL (ref 150.0–400.0)
RBC: 4.91 Mil/uL (ref 4.22–5.81)
RDW: 13.4 % (ref 11.5–15.5)
WBC: 5.2 K/uL (ref 4.0–10.5)

## 2023-09-27 LAB — VITAMIN B12: Vitamin B-12: 405 pg/mL (ref 211–911)

## 2023-09-27 LAB — LIPID PANEL
Cholesterol: 215 mg/dL — ABNORMAL HIGH (ref 0–200)
HDL: 44.5 mg/dL (ref >39.00)
NonHDL: 170.06
Total CHOL/HDL Ratio: 5
Triglycerides: 567 mg/dL — ABNORMAL HIGH (ref 0.0–149.0)
VLDL: 113.4 mg/dL — ABNORMAL HIGH (ref 0.0–40.0)

## 2023-09-27 LAB — URINALYSIS, ROUTINE W REFLEX MICROSCOPIC
Bilirubin Urine: NEGATIVE
Ketones, ur: NEGATIVE
Leukocytes,Ua: NEGATIVE
Nitrite: NEGATIVE
Specific Gravity, Urine: 1.02 (ref 1.000–1.030)
Total Protein, Urine: NEGATIVE
Urine Glucose: NEGATIVE
Urobilinogen, UA: 0.2 (ref 0.0–1.0)
pH: 6 (ref 5.0–8.0)

## 2023-09-27 LAB — TSH: TSH: 0.97 u[IU]/mL (ref 0.35–5.50)

## 2023-09-27 LAB — BASIC METABOLIC PANEL WITH GFR
BUN: 12 mg/dL (ref 6–23)
CO2: 25 meq/L (ref 19–32)
Calcium: 9.5 mg/dL (ref 8.4–10.5)
Chloride: 104 meq/L (ref 96–112)
Creatinine, Ser: 0.79 mg/dL (ref 0.40–1.50)
GFR: 101.8 mL/min (ref >60.00)
Glucose, Bld: 104 mg/dL — ABNORMAL HIGH (ref 70–99)
Potassium: 4.1 meq/L (ref 3.5–5.1)
Sodium: 139 meq/L (ref 135–145)

## 2023-09-27 LAB — HEPATIC FUNCTION PANEL
ALT: 57 U/L — ABNORMAL HIGH (ref 0–53)
AST: 44 U/L — ABNORMAL HIGH (ref 0–37)
Albumin: 4.6 g/dL (ref 3.5–5.2)
Alkaline Phosphatase: 66 U/L (ref 39–117)
Bilirubin, Direct: 0.1 mg/dL (ref 0.0–0.3)
Total Bilirubin: 0.8 mg/dL (ref 0.2–1.2)
Total Protein: 7.4 g/dL (ref 6.0–8.3)

## 2023-09-27 LAB — PSA: PSA: 0.41 ng/mL (ref 0.10–4.00)

## 2023-09-27 LAB — VITAMIN D 25 HYDROXY (VIT D DEFICIENCY, FRACTURES): VITD: 43.44 ng/mL (ref 30.00–100.00)

## 2023-09-27 LAB — MICROALBUMIN / CREATININE URINE RATIO
Creatinine,U: 134.2 mg/dL
Microalb Creat Ratio: 9.8 mg/g (ref 0.0–30.0)
Microalb, Ur: 1.3 mg/dL (ref 0.0–1.9)

## 2023-09-27 LAB — HEMOGLOBIN A1C: Hgb A1c MFr Bld: 6.4 % (ref 4.6–6.5)

## 2023-09-27 LAB — LDL CHOLESTEROL, DIRECT: Direct LDL: 79 mg/dL

## 2023-09-27 MED ORDER — MELOXICAM 15 MG PO TABS: 15.0000 mg | ORAL_TABLET | Freq: Every day | ORAL | 1 refills | Status: DC | PRN

## 2023-09-27 MED ORDER — FENOFIBRATE 145 MG PO TABS: 145.0000 mg | ORAL_TABLET | Freq: Every day | ORAL | 3 refills | Status: DC

## 2023-09-27 MED ORDER — SILVER SULFADIAZINE 1 % EX CREA: 1.0000 | TOPICAL_CREAM | Freq: Every day | CUTANEOUS | 0 refills | Status: AC

## 2023-09-27 MED ORDER — ATORVASTATIN CALCIUM 20 MG PO TABS: ORAL_TABLET | ORAL | 3 refills | Status: DC

## 2023-09-27 MED ORDER — METOPROLOL SUCCINATE ER 50 MG PO TB24: 50.0000 mg | ORAL_TABLET | Freq: Every day | ORAL | 3 refills | Status: DC

## 2023-09-27 NOTE — Patient Instructions (Signed)
 Please take all new medication as prescribed - the meloxicam  as needed for neck pain, and the burn cream for the arm  Please continue all other medications as before, and refills have been done if requested.  Please have the pharmacy call with any other refills you may need.  Please continue your efforts at being more active, low cholesterol diet, and weight control.  You are otherwise up to date with prevention measures today.  Please keep your appointments with your specialists as you may have planned  You will be contacted regarding the referral for: Sports Medicine on the first floor  Please go to the XRAY Department in the first floor for the x-ray testing  Please go to the LAB at the blood drawing area for the tests to be done  You will be contacted by phone if any changes need to be made immediately.  Otherwise, you will receive a letter about your results with an explanation, but please check with MyChart first.  Please make an Appointment to return in 6 months, or sooner if needed
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
## 2023-09-27 NOTE — Progress Notes (Signed)
 The test results show that your current treatment is OK, as the tests are stable, except the triglycerides are VERY high, with a risk for pancreatitis.  We should ask you to start fenofibrate  145 mg per day to get this down.  This is easy to take, and I will send now.  otherwise.  Please continue the same plan.  Staff to please inform pt, I will do rx x 1

## 2023-09-27 NOTE — Assessment & Plan Note (Signed)
 Etiology unclear, declines GI referral for now

## 2023-09-27 NOTE — Assessment & Plan Note (Signed)
 Last vitamin D Lab Results  Component Value Date   VD25OH 23.82 (L) 08/04/2022   Low, to start oral replacement

## 2023-09-27 NOTE — Assessment & Plan Note (Signed)
 C/w msk strain, can't r/o underlying ddd or djd - for c spine films, mobic  15 every day prn, flexeril 5 tid prn, and refer sports medicine

## 2023-09-27 NOTE — Assessment & Plan Note (Signed)

## 2023-09-27 NOTE — Assessment & Plan Note (Signed)
 Lab Results  Component Value Date   LDLCALC 98 08/04/2022   Stable, pt to continue current statin lipitor 20 qd

## 2023-09-27 NOTE — Assessment & Plan Note (Signed)
 Lab Results  Component Value Date   HGBA1C 5.9 08/04/2022   Stable, pt to continue current medical treatment  - diet, wt control

## 2023-09-27 NOTE — Assessment & Plan Note (Signed)
 An ongoing issue, pt encouraged to moderate or quit, pt vague on amount of intake

## 2023-09-27 NOTE — Assessment & Plan Note (Signed)
 Lab Results  Component Value Date   VITAMINB12 >1500 (H) 08/04/2022   Stable, cont oral replacement - b12 1000 mcg qd

## 2023-10-08 NOTE — Progress Notes (Deleted)
    Ben Jackson D.CLEMENTEEN [facility] [address] [place] [address] Phone: [phone]   Assessment and Plan:     There are no diagnoses linked to this encounter.  ***   Pertinent previous records reviewed include ***    Follow Up: ***     Subjective:   I, Chad [name], am serving as a Neurosurgeon for Doctor Morene Mace  Chief Complaint: neck pain   HPI:   10/09/2023 Patient is a 53 year old male with neck pain. Patient states   Relevant Historical Information: ***  Additional pertinent review of systems negative.   Current Outpatient Medications:    aspirin  EC 81 MG tablet, Take 81 mg by mouth daily., Disp: , Rfl:    atorvastatin  (LIPITOR) 20 MG tablet, TAKE 1 TABLET BY MOUTH EVERY DAY, Disp: 90 tablet, Rfl: 3   fenofibrate  (TRICOR ) 145 MG tablet, Take 1 tablet (145 mg total) by mouth daily., Disp: 90 tablet, Rfl: 3   ketoconazole  (NIZORAL ) 2 % cream, Apply 1 Application topically daily., Disp: 30 g, Rfl: 1   meloxicam  (MOBIC ) 15 MG tablet, Take 1 tablet (15 mg total) by mouth daily as needed., Disp: 90 tablet, Rfl: 1   metoprolol  succinate (TOPROL -XL) 50 MG 24 hr tablet, Take 1 tablet (50 mg total) by mouth daily., Disp: 90 tablet, Rfl: 3   silver  sulfADIAZINE  (SILVADENE ) 1 % cream, Apply 1 Application topically daily., Disp: 50 g, Rfl: 0   Objective:     There were no vitals filed for this visit.    There is no height or weight on file to calculate BMI.    Physical Exam:    ***   Electronically signed by:  Odis Mace D.[facility] 12:39 PM 10/08/23

## 2023-10-09 ENCOUNTER — Ambulatory Visit: Admitting: Sports Medicine

## 2023-10-12 ENCOUNTER — Other Ambulatory Visit: Payer: Self-pay | Admitting: Internal Medicine

## 2023-10-12 DIAGNOSIS — E782 Mixed hyperlipidemia: Secondary | ICD-10-CM

## 2023-10-23 NOTE — Progress Notes (Deleted)
    [name] [address] [place] [address] Phone: [phone]   Assessment and Plan:     There are no diagnoses linked to this encounter.  ***   Pertinent previous records reviewed include ***    Follow Up: ***     Subjective:   I, Chad [name], am serving as a Neurosurgeon for Chad [name]  Chief Complaint: neck pain   HPI:   10/24/2023 Patient is a 53 year old male with neck pain. Patient states   Relevant Historical Information: ***  Additional pertinent review of systems negative.   Current Outpatient Medications:    aspirin  EC 81 MG tablet, Take 81 mg by mouth daily., Disp: , Rfl:    atorvastatin  (LIPITOR) 20 MG tablet, TAKE 1 TABLET BY MOUTH EVERY DAY, Disp: 90 tablet, Rfl: 3   fenofibrate  (TRICOR ) 145 MG tablet, Take 1 tablet (145 mg total) by mouth daily., Disp: 90 tablet, Rfl: 3   ketoconazole  (NIZORAL ) 2 % cream, Apply 1 Application topically daily., Disp: 30 g, Rfl: 1   meloxicam  (MOBIC ) 15 MG tablet, Take 1 tablet (15 mg total) by mouth daily as needed., Disp: 90 tablet, Rfl: 1   metoprolol  succinate (TOPROL -XL) 50 MG 24 hr tablet, Take 1 tablet (50 mg total) by mouth daily., Disp: 90 tablet, Rfl: 3   silver  sulfADIAZINE  (SILVADENE ) 1 % cream, Apply 1 Application topically daily., Disp: 50 g, Rfl: 0   Objective:     There were no vitals filed for this visit.    There is no height or weight on file to calculate BMI.    Physical Exam:    ***   Electronically signed by:  Chad [name] 7:38 AM 10/23/23

## 2023-10-24 ENCOUNTER — Ambulatory Visit: Admitting: Sports Medicine

## 2023-10-30 ENCOUNTER — Ambulatory Visit (INDEPENDENT_AMBULATORY_CARE_PROVIDER_SITE_OTHER): Payer: 59

## 2023-10-30 DIAGNOSIS — I429 Cardiomyopathy, unspecified: Secondary | ICD-10-CM | POA: Diagnosis not present

## 2023-11-06 ENCOUNTER — Telehealth: Payer: Self-pay

## 2023-11-06 DIAGNOSIS — M542 Cervicalgia: Secondary | ICD-10-CM

## 2023-11-06 NOTE — Telephone Encounter (Signed)
 Copied from CRM [id number]. Topic: Referral - Question >> Nov 06, 2023 11:00 AM Anairis L wrote: Reason for CRM: PT would like a new referral sent to [facility] sports medicine he had two appointments and was a no show.

## 2023-11-07 LAB — CUP PACEART REMOTE DEVICE CHECK
Battery Remaining Longevity: 38 mo
Battery Remaining Percentage: 39 %
Battery Voltage: 2.9 V
Brady Statistic RV Percent Paced: 0 %
Date Time Interrogation Session: 20250819022818
HighPow Impedance: 80 Ohm
HighPow Impedance: 80 Ohm
Implantable Lead Connection Status: 753985
Implantable Lead Implant Date: 20180102
Implantable Lead Location: 753860
Implantable Lead Model: 7122
Implantable Pulse Generator Implant Date: 20180102
Lead Channel Impedance Value: 450 Ohm
Lead Channel Pacing Threshold Amplitude: 1.5 V
Lead Channel Pacing Threshold Pulse Width: 0.6 ms
Lead Channel Sensing Intrinsic Amplitude: 3.6 mV
Lead Channel Setting Pacing Amplitude: 3 V
Lead Channel Setting Pacing Pulse Width: 0.6 ms
Lead Channel Setting Sensing Sensitivity: 0.5 mV
Pulse Gen Serial Number: 1246759
Zone Setting Status: 755011

## 2023-11-08 ENCOUNTER — Ambulatory Visit: Payer: Self-pay | Admitting: Internal Medicine

## 2023-11-09 NOTE — Telephone Encounter (Signed)
[place] referral don

## 2023-11-13 ENCOUNTER — Ambulatory Visit: Payer: Self-pay

## 2023-11-13 ENCOUNTER — Encounter: Payer: Self-pay | Admitting: Sports Medicine

## 2023-11-13 NOTE — Telephone Encounter (Signed)
 FYI Only or Action Required?: FYI only for provider.  Patient was last seen in primary care on 09/27/2023 by Norleen Lynwood ORN, MD.  Called Nurse Triage reporting Fatigue.  Symptoms began year and worsening.  Interventions attempted: Nothing.  Symptoms are: gradually worsening.  Triage Disposition: See PCP When Office is Open (Within 3 Days)  Patient/caregiver understands and will follow disposition?:     Copied from CRM #[id number]. Topic: Clinical - Red Word Triage >> Nov 13, 2023  2:32 PM Chad [name] wrote: [facility] that prompted transfer to Nurse Triage: very sleepy    he wakes uP H e is very tired , sleepy, about 15-20 mins into doing whatever he is doing he is sleepy even when driving. very sleepy . Reason for Disposition  [1] MILD weakness (e.g., does not interfere with ability to work, go to school, normal activities) AND [2] persists > 1 week  Answer Assessment - Initial Assessment Questions 1. DESCRIPTION: Describe how you are feeling.     Sleepy 15-20 minutes after driving: pt state it is not normal to be sleepy like this:  pt stated sleepy all the times 2. SEVERITY: How bad is it?  Can you stand and walk?     yes 3. ONSET: When did these symptoms begin? (e.g., hours, days, weeks, months)     Year and worsening 4. CAUSE: What do you think is causing the weakness or fatigue? (e.g., not drinking enough fluids, medical problem, trouble sleeping)     no 5. NEW MEDICINES:  Have you started on any new medicines recently? (e.g., opioid pain medicines, benzodiazepines, muscle relaxants, antidepressants, antihistamines, neuroleptics, beta blockers)     no 6. OTHER SYMPTOMS: Do you have any other symptoms? (e.g., chest pain, fever, cough, SOB, vomiting, diarrhea, bleeding, other areas of pain)     no 7. PREGNANCY: Is there any chance you are pregnant? When was your last menstrual period?     na  Protocols used: Weakness (Generalized) and Fatigue-A-AH

## 2023-11-15 ENCOUNTER — Ambulatory Visit (INDEPENDENT_AMBULATORY_CARE_PROVIDER_SITE_OTHER): Admitting: Internal Medicine

## 2023-11-15 ENCOUNTER — Encounter: Payer: Self-pay | Admitting: Internal Medicine

## 2023-11-15 VITALS — BP 100/76 | HR 71 | Temp 97.8°F | Ht 67.0 in | Wt 169.0 lb

## 2023-11-15 DIAGNOSIS — E782 Mixed hyperlipidemia: Secondary | ICD-10-CM | POA: Diagnosis not present

## 2023-11-15 DIAGNOSIS — F5101 Primary insomnia: Secondary | ICD-10-CM | POA: Diagnosis not present

## 2023-11-15 DIAGNOSIS — G4710 Hypersomnia, unspecified: Secondary | ICD-10-CM

## 2023-11-15 DIAGNOSIS — R739 Hyperglycemia, unspecified: Secondary | ICD-10-CM | POA: Diagnosis not present

## 2023-11-15 DIAGNOSIS — M542 Cervicalgia: Secondary | ICD-10-CM

## 2023-11-15 DIAGNOSIS — E538 Deficiency of other specified B group vitamins: Secondary | ICD-10-CM | POA: Diagnosis not present

## 2023-11-15 DIAGNOSIS — R7989 Other specified abnormal findings of blood chemistry: Secondary | ICD-10-CM

## 2023-11-15 MED ORDER — FENOFIBRATE 145 MG PO TABS: 145.0000 mg | ORAL_TABLET | Freq: Every day | ORAL | 3 refills | Status: DC

## 2023-11-15 MED ORDER — TRAZODONE HCL 50 MG PO TABS: 25.0000 mg | ORAL_TABLET | Freq: Every evening | ORAL | 1 refills | Status: DC | PRN

## 2023-11-15 NOTE — Patient Instructions (Signed)
 Please take all new medication as prescribed - the fenofibrate  145 mg per day for the LDL and triglycerides  Please take all new medication as prescribed  - the trazodone  at bedtime for sleep as needed  Please continue all other medications as before, and refills have been done if requested.  Please have the pharmacy call with any other refills you may need.  Please continue your efforts at being more active, low cholesterol diet, and weight control.  You are otherwise up to date with prevention measures today.  Please keep your appointments with your specialists as you may have planned  You will be contacted regarding the referral for: orthopedic surgury for the neck  You will be contacted regarding the referral for: Pulmonary for the daytime sleepiness  We can hold on lab testing today  Please make an Appointment to return in 6 months, or sooner if needed

## 2023-11-15 NOTE — Progress Notes (Unsigned)
 Patient ID: Chad [name], male   DOB: December 21, 1970, 53 y.o.   MRN: [id number]        Chief Complaint: follow up hld, post neck pain, hypersomnolence with driving and fatigue, insomnia       HPI:  Chad [name] is a 53 y.o. male here with recurring post neck pain, has been referred to sports med but did not make his appt so not able to be seen there further.  No radicular symptoms.  Also has difficulty with getting to sleep most nights, also has significant daytime somnolence especially with driving more than 30 min.  Has not yet started fenofibrate .  Pt denies chest pain, increased sob or doe, wheezing, orthopnea, PND, increased LE swelling, palpitations, dizziness or syncope.   Pt denies polydipsia, polyuria, or new focal neuro s/s.          Wt Readings from Last 3 Encounters:  11/15/23 169 lb (76.7 kg)  09/27/23 166 lb (75.3 kg)  08/09/23 171 lb 12.8 oz (77.9 kg)   BP Readings from Last 3 Encounters:  11/15/23 100/76  09/27/23 120/74  08/09/23 114/86         Past Medical History:  Diagnosis Date   AICD (automatic cardioverter/defibrillator) present    Brugada syndrome    s/p ICD 12/07   DVT (deep venous thrombosis) (HCC)    Dysrhythmia    Echocardiogram    Echo 10/19: EF 60-65, no RWMA, mild LAE, mild TR, PASP 33   History of Carotid Doppler ultrasound    Carotid US  10/19: normal   Hyperlipidemia    Presence of permanent cardiac pacemaker    Syncope    s/p ICD 12/07   Past Surgical History:  Procedure Laterality Date   BACK SURGERY     CARDIAC DEFIBRILLATOR PLACEMENT  02/2006   [employer]. Jude Atlas   FINGER TENDON REPAIR Left    2nd digit   ICD GENERATOR CHANGE  03/14/2016   ICD GENERATOR REMOVAL AND INSERTION OF NEW ICD, WITH LEAD  REMOVAL EXTRACTION thelbert 03/14/2016   ICD LEAD REMOVAL N/A 03/14/2016   Procedure: ICD GENERATOR REMOVAL AND INSERTION OF NEW ICD, WITH LEAD  REMOVAL EXTRACTION;  Surgeon: Danelle LELON Birmingham, MD;  Location: [facility] OR;  Service: Cardiovascular;  Laterality: N/A;   LUMBAR  DISC SURGERY  1997   TEE WITHOUT CARDIOVERSION N/A 03/14/2016   Procedure: TRANSESOPHAGEAL ECHOCARDIOGRAM (TEE);  Surgeon: Danelle LELON Birmingham, MD;  Location: [facility] OR;  Service: Cardiovascular;  Laterality: N/A;   UPPER EXTREMITY VENOGRAPHY Bilateral 05/12/2016   Procedure: Central Venography;  Surgeon: Carlin FORBES Haddock, MD;  Location: [facility] INVASIVE CV LAB;  Service: Cardiovascular;  Laterality: Bilateral;    reports that he quit smoking about 12 years ago. His smoking use included cigarettes. He started smoking about 35 years ago. He has a 23 pack-year smoking history. He has never used smokeless tobacco. He reports current alcohol use of about 6.0 standard drinks of alcohol per week. He reports that he does not use drugs. family history includes Diabetes in his mother; Heart disease in his mother; Hyperlipidemia in his mother. No Known Allergies Current Outpatient Medications on File Prior to Visit  Medication Sig Dispense Refill   aspirin  EC 81 MG tablet Take 81 mg by mouth daily.     atorvastatin  (LIPITOR) 20 MG tablet TAKE 1 TABLET BY MOUTH EVERY DAY 90 tablet 3   ketoconazole  (NIZORAL ) 2 % cream Apply 1 Application topically daily. 30 g 1   meloxicam  (MOBIC ) 15 MG tablet  Take 1 tablet (15 mg total) by mouth daily as needed. 90 tablet 1   metoprolol  succinate (TOPROL -XL) 50 MG 24 hr tablet Take 1 tablet (50 mg total) by mouth daily. 90 tablet 3   silver  sulfADIAZINE  (SILVADENE ) 1 % cream Apply 1 Application topically daily. 50 g 0   No current facility-administered medications on file prior to visit.        ROS:  All others reviewed and negative.  Objective        PE:  BP 100/76   Pulse 71   Temp 97.8 F (36.6 C)   Ht 5' 7 (1.702 m)   Wt 169 lb (76.7 kg)   SpO2 96%   BMI 26.47 kg/m                 Constitutional: Pt appears in NAD               HENT: Head: NCAT.                Right Ear: External ear normal.                 Left Ear: External ear normal.                Eyes: . Pupils  are equal, round, and reactive to light. Conjunctivae and EOM are normal               Nose: without d/c or deformity               Neck: Neck supple. Gross normal ROM               Cardiovascular: Normal rate and regular rhythm.                 Pulmonary/Chest: Effort normal and breath sounds without rales or wheezing.                Abd:  Soft, NT, ND, + BS, no organomegaly               Neurological: Pt is alert. At baseline orientation, motor grossly intact               Skin: Skin is warm. No rashes, no other new lesions, LE edema - none               Psychiatric: Pt behavior is normal without agitation   Micro: none  Cardiac tracings I have personally interpreted today:  none  Pertinent Radiological findings (summarize): none   Lab Results  Component Value Date   WBC 5.2 09/27/2023   HGB 15.5 09/27/2023   HCT 46.0 09/27/2023   PLT 353.0 09/27/2023   GLUCOSE 104 (H) 09/27/2023   CHOL 215 (H) 09/27/2023   TRIG (H) 09/27/2023    567.0 Triglyceride is over 400; calculations on Lipids are invalid.   HDL 44.50 09/27/2023   LDLDIRECT 79.0 09/27/2023   LDLCALC 98 08/04/2022   ALT 57 (H) 09/27/2023   AST 44 (H) 09/27/2023   NA 139 09/27/2023   K 4.1 09/27/2023   CL 104 09/27/2023   CREATININE 0.79 09/27/2023   BUN 12 09/27/2023   CO2 25 09/27/2023   TSH 0.97 09/27/2023   PSA 0.41 09/27/2023   HGBA1C 6.4 09/27/2023   MICROALBUR 1.3 09/27/2023   Assessment/Plan:  Chad [name] is a 53 y.o. Asian [4] male with  has a past medical history of AICD (automatic cardioverter/defibrillator) present, Brugada syndrome, DVT (deep  venous thrombosis) (HCC), Dysrhythmia, Echocardiogram, History of Carotid Doppler ultrasound, Hyperlipidemia, Presence of permanent cardiac pacemaker, and Syncope.  B12 deficiency Lab Results  Component Value Date   VITAMINB12 405 09/27/2023   Stable, cont replacement - b12 1000 mcg monthlyl IM  Hyperglycemia Lab Results  Component Value Date   HGBA1C 6.4  09/27/2023   Stable, pt to continue current medical treatment  - diet wt control   Hyperlipidemia Lab Results  Component Value Date   LDLCALC 98 08/04/2022   Uncontrolled,, pt to continue current statin lipitor 20 mg, and add fenofibrate  145 mg every day given high triglycerides  Low vitamin D  level Last vitamin D  Lab Results  Component Value Date   VD25OH 43.44 09/27/2023   Stable, cont oral replacement   Posterior neck pain Persistent , pt asking for ortho referral  Insomnia Mild to mod, for trazodone  50 mg at bedtime prn, to f/u any worsening symptoms or concerns  Hypersomnolence Pt with suspicion for OSA - for pulm referral  Followup: Return in about 6 months (around 05/14/2024).  Lynwood Rush, MD 11/16/2023 7:30 PM [facility]  Primary Care - [facility] Internal Medicine

## 2023-11-16 ENCOUNTER — Encounter: Payer: Self-pay | Admitting: Internal Medicine

## 2023-11-16 DIAGNOSIS — G4710 Hypersomnia, unspecified: Secondary | ICD-10-CM | POA: Insufficient documentation

## 2023-11-16 NOTE — Assessment & Plan Note (Signed)
 Last vitamin D  Lab Results  Component Value Date   VD25OH 43.44 09/27/2023   Stable, cont oral replacement

## 2023-11-16 NOTE — Assessment & Plan Note (Signed)
 Lab Results  Component Value Date   HGBA1C 6.4 09/27/2023   Stable, pt to continue current medical treatment  - diet wt control

## 2023-11-16 NOTE — Assessment & Plan Note (Signed)
 Mild to mod, for trazodone 50 mg at bedtime prn,  to f/u any worsening symptoms or concerns

## 2023-11-16 NOTE — Assessment & Plan Note (Addendum)
 Lab Results  Component Value Date   LDLCALC 98 08/04/2022   Uncontrolled,, pt to continue current statin lipitor 20 mg, and add fenofibrate  145 mg every day given high triglycerides

## 2023-11-16 NOTE — Assessment & Plan Note (Signed)
 Persistent , pt asking for ortho referral

## 2023-11-16 NOTE — Assessment & Plan Note (Signed)
 Pt with suspicion for OSA - for pulm referral

## 2023-11-16 NOTE — Assessment & Plan Note (Addendum)
 Lab Results  Component Value Date   VITAMINB12 405 09/27/2023   Stable, cont replacement - b12 1000 mcg monthlyl IM

## 2023-12-05 NOTE — Progress Notes (Signed)
Remote ICD Transmission.

## 2023-12-06 ENCOUNTER — Other Ambulatory Visit: Payer: Self-pay | Admitting: Internal Medicine

## 2023-12-06 DIAGNOSIS — E782 Mixed hyperlipidemia: Secondary | ICD-10-CM

## 2023-12-06 MED ORDER — ATORVASTATIN CALCIUM 20 MG PO TABS: 20.0000 mg | ORAL_TABLET | Freq: Every day | ORAL | 3 refills | Status: DC

## 2023-12-06 NOTE — Telephone Encounter (Signed)
 Med refill

## 2023-12-06 NOTE — Telephone Encounter (Signed)
 Copied from CRM [id number]. Topic: Clinical - Medication Refill >> Dec 06, 2023 12:03 PM [place] J wrote: Medication: atorvastatin  (LIPITOR) 20 MG tablet  Has the patient contacted their pharmacy? Yes (Agent: If no, request that the patient contact the pharmacy for the refill. If patient does not wish to contact the pharmacy document the reason why and proceed with request.) (Agent: If yes, when and what did the pharmacy advise?) Pharmacy instructed patient to call pharmacy.  This is the patient's preferred pharmacy:  [id number] [place], [place] - [address] AT [facility] [address] [place] [address] Phone: [phone] Fax: [phone]  Is this the correct pharmacy for this prescription? Yes If no, delete pharmacy and type the correct one.   Has the prescription been filled recently? No  Is the patient out of the medication? Yes  Has the patient been seen for an appointment in the last year OR does the patient have an upcoming appointment? Yes  Can we respond through MyChart? No  Agent: Please be advised that Rx refills may take up to 3 business days. We ask that you follow-up with your pharmacy.

## 2023-12-07 ENCOUNTER — Encounter (HOSPITAL_BASED_OUTPATIENT_CLINIC_OR_DEPARTMENT_OTHER): Payer: Self-pay | Admitting: Obstetrics and Gynecology

## 2024-02-11 ENCOUNTER — Ambulatory Visit

## 2024-02-25 LAB — CUP PACEART REMOTE DEVICE CHECK
Battery Remaining Longevity: 37 mo
Battery Remaining Percentage: 38 %
Battery Voltage: 2.9 V
Brady Statistic RV Percent Paced: 1 %
Date Time Interrogation Session: 20251201040536
HighPow Impedance: 88 Ohm
HighPow Impedance: 88 Ohm
Implantable Lead Connection Status: 753985
Implantable Lead Implant Date: 20180102
Implantable Lead Location: 753860
Implantable Lead Model: 7122
Implantable Pulse Generator Implant Date: 20180102
Lead Channel Impedance Value: 490 Ohm
Lead Channel Pacing Threshold Amplitude: 1.5 V
Lead Channel Pacing Threshold Pulse Width: 0.6 ms
Lead Channel Sensing Intrinsic Amplitude: 6.2 mV
Lead Channel Setting Pacing Amplitude: 3 V
Lead Channel Setting Pacing Pulse Width: 0.6 ms
Lead Channel Setting Sensing Sensitivity: 0.5 mV
Pulse Gen Serial Number: 1246759
Zone Setting Status: 755011

## 2024-02-25 NOTE — Progress Notes (Signed)
 Remote ICD Transmission

## 2024-02-26 ENCOUNTER — Ambulatory Visit: Payer: Self-pay | Admitting: Internal Medicine

## 2024-03-21 ENCOUNTER — Ambulatory Visit: Payer: Self-pay

## 2024-03-21 NOTE — Telephone Encounter (Signed)
 FYI Only or Action Required?: FYI only for provider: ED advised.  Patient was last seen in primary care on 11/15/2023 by Norleen Lynwood ORN, MD.  Called Nurse Triage reporting Fever, Fatigue, and Rectal Bleeding.  Symptoms began several months ago.  Interventions attempted: Nothing.  Symptoms are: gradually worsening.  Triage Disposition: Go to ED Now (Notify PCP)  Patient/caregiver understands and will follow disposition?: Yes                                  1. DESCRIPTION: Describe how you are feeling.     Intermittent tiredness, reports feeling feverish after he consumes alcohol 2. SEVERITY: How bad is it?  Can you stand and walk?     Mild, reports he is able to walk normally 3. ONSET: When did these symptoms begin? (e.g., hours, days, weeks, months)     3-4 months ago 4. CAUSE: What do you think is causing the weakness or fatigue? (e.g., not drinking enough fluids, medical problem, trouble sleeping)     Unsure 6. OTHER SYMPTOMS: Do you have any other symptoms? (e.g., chest pain, fever, cough, SOB, vomiting, diarrhea, bleeding, other areas of pain)     Blood in stool- reports this has been an ongoing issues for years and that he sees a specialist for this, but states fatigue is a new onset, reports feeling feverish, but has not taken his temperature, burning sensation in stomach    This RN advised ED. Patient verbalized understanding and stated he would be able to contact someone to drive him.   Reason for Disposition  Black or tarry bowel movements  Protocols used: Weakness (Generalized) and Fatigue-A-AH  Copied from CRM #[id number]. Topic: Clinical - Red Word Triage >> Mar 21, 2024  9:21 AM Tiffini S wrote: Red Word that prompted transfer to Nurse Triage: Patient drunk two beers and have a fever and tired- have never happened before

## 2024-03-31 ENCOUNTER — Ambulatory Visit: Admitting: Internal Medicine

## 2024-04-03 ENCOUNTER — Ambulatory Visit: Payer: Self-pay | Admitting: Internal Medicine

## 2024-04-03 ENCOUNTER — Ambulatory Visit: Admitting: Internal Medicine

## 2024-04-03 ENCOUNTER — Encounter: Payer: Self-pay | Admitting: Internal Medicine

## 2024-04-03 VITALS — BP 124/80 | HR 50 | Temp 97.9°F | Ht 67.0 in | Wt 170.0 lb

## 2024-04-03 DIAGNOSIS — E559 Vitamin D deficiency, unspecified: Secondary | ICD-10-CM

## 2024-04-03 DIAGNOSIS — R7989 Other specified abnormal findings of blood chemistry: Secondary | ICD-10-CM

## 2024-04-03 DIAGNOSIS — Z23 Encounter for immunization: Secondary | ICD-10-CM

## 2024-04-03 DIAGNOSIS — G250 Essential tremor: Secondary | ICD-10-CM | POA: Diagnosis not present

## 2024-04-03 DIAGNOSIS — K219 Gastro-esophageal reflux disease without esophagitis: Secondary | ICD-10-CM

## 2024-04-03 DIAGNOSIS — G252 Other specified forms of tremor: Secondary | ICD-10-CM | POA: Diagnosis not present

## 2024-04-03 DIAGNOSIS — R739 Hyperglycemia, unspecified: Secondary | ICD-10-CM | POA: Diagnosis not present

## 2024-04-03 DIAGNOSIS — E782 Mixed hyperlipidemia: Secondary | ICD-10-CM

## 2024-04-03 LAB — LIPID PANEL
Cholesterol: 179 mg/dL (ref 28–200)
HDL: 52.1 mg/dL
LDL Cholesterol: 89 mg/dL (ref 10–99)
NonHDL: 127.02
Total CHOL/HDL Ratio: 3
Triglycerides: 188 mg/dL — ABNORMAL HIGH (ref 10.0–149.0)
VLDL: 37.6 mg/dL (ref 0.0–40.0)

## 2024-04-03 LAB — VITAMIN D 25 HYDROXY (VIT D DEFICIENCY, FRACTURES): VITD: 49.57 ng/mL (ref 30.00–100.00)

## 2024-04-03 MED ORDER — ATORVASTATIN CALCIUM 20 MG PO TABS: 20.0000 mg | ORAL_TABLET | Freq: Every day | ORAL | 3 refills | Status: AC

## 2024-04-03 MED ORDER — TRAZODONE HCL 50 MG PO TABS: 25.0000 mg | ORAL_TABLET | Freq: Every evening | ORAL | 1 refills | Status: AC | PRN

## 2024-04-03 MED ORDER — METOPROLOL SUCCINATE ER 50 MG PO TB24: 50.0000 mg | ORAL_TABLET | Freq: Every day | ORAL | 3 refills | Status: AC

## 2024-04-03 MED ORDER — FENOFIBRATE 145 MG PO TABS: 145.0000 mg | ORAL_TABLET | Freq: Every day | ORAL | 3 refills | Status: AC

## 2024-04-03 NOTE — Assessment & Plan Note (Signed)
 Lab Results  Component Value Date   HGBA1C 6.4 09/27/2023   Stable, pt to continue current medical treatment  - diet, wt control

## 2024-04-03 NOTE — Assessment & Plan Note (Signed)
 Last vitamin D  Lab Results  Component Value Date   VD25OH 49.57 04/03/2024   Stable, cont oral replacement but at lower dose 5000 u qd

## 2024-04-03 NOTE — Progress Notes (Signed)
 The test results show that your current treatment is OK, as the tests are stable.  Please continue the same plan.  There is no other need for change of treatment or further evaluation based on these results, at this time.  thanks

## 2024-04-03 NOTE — Progress Notes (Signed)
 Patient ID: Chad [name], male   DOB: Dec 17, 1970, 54 y.o.   MRN: [id number]        Chief Complaint: follow up gerd, hyperlipidemia, head tremor and low vit d       HPI:  Chad [name] is a 54 y.o. male here overall doing ok, Pt denies chest pain, increased sob or doe, wheezing, orthopnea, PND, increased LE swelling, palpitations, dizziness or syncope.   Pt denies polydipsia, polyuria, or new focal neuro s/s.  Denies worsening reflux, abd pain, dysphagia, n/v, bowel change or blood.  Good compliance with medications including the fenofibrate ,  Has been taking 10000 u Vit D and wondering if too much.  Also has head tremor which has been worsening  Due for flu shot       Wt Readings from Last 3 Encounters:  04/03/24 170 lb (77.1 kg)  11/15/23 169 lb (76.7 kg)  09/27/23 166 lb (75.3 kg)   BP Readings from Last 3 Encounters:  04/03/24 124/80  11/15/23 100/76  09/27/23 120/74         Past Medical History:  Diagnosis Date   AICD (automatic cardioverter/defibrillator) present    Brugada syndrome    s/p ICD 12/07   DVT (deep venous thrombosis) (HCC)    Dysrhythmia    Echocardiogram    Echo 10/19: EF 60-65, no RWMA, mild LAE, mild TR, PASP 33   History of Carotid Doppler ultrasound    Carotid US  10/19: normal   Hyperlipidemia    Presence of permanent cardiac pacemaker    Syncope    s/p ICD 12/07   Past Surgical History:  Procedure Laterality Date   BACK SURGERY     CARDIAC DEFIBRILLATOR PLACEMENT  02/2006   [employer]   FINGER TENDON REPAIR Left    2nd digit   ICD GENERATOR CHANGE  03/14/2016   ICD GENERATOR REMOVAL AND INSERTION OF NEW ICD, WITH LEAD  REMOVAL EXTRACTION thelbert 03/14/2016   ICD LEAD REMOVAL N/A 03/14/2016   Procedure: ICD GENERATOR REMOVAL AND INSERTION OF NEW ICD, WITH LEAD  REMOVAL EXTRACTION;  Surgeon: Danelle LELON Birmingham, MD;  Location: [facility] OR;  Service: Cardiovascular;  Laterality: N/A;   LUMBAR DISC SURGERY  1997   TEE WITHOUT CARDIOVERSION N/A 03/14/2016   Procedure:  TRANSESOPHAGEAL ECHOCARDIOGRAM (TEE);  Surgeon: Danelle LELON Birmingham, MD;  Location: [facility] OR;  Service: Cardiovascular;  Laterality: N/A;   UPPER EXTREMITY VENOGRAPHY Bilateral 05/12/2016   Procedure: Central Venography;  Surgeon: Carlin FORBES Haddock, MD;  Location: [facility] INVASIVE CV LAB;  Service: Cardiovascular;  Laterality: Bilateral;    reports that he quit smoking about 13 years ago. His smoking use included cigarettes. He started smoking about 36 years ago. He has a 23 pack-year smoking history. He has never used smokeless tobacco. He reports current alcohol use of about 6.0 standard drinks of alcohol per week. He reports that he does not use drugs. family history includes Diabetes in his mother; Heart disease in his mother; Hyperlipidemia in his mother. Allergies[1] Medications Ordered Prior to Encounter[2]      ROS:  All others reviewed and negative.  Objective        PE:  BP 124/80 (BP Location: Left Arm, Patient Position: Sitting, Cuff Size: Normal)   Pulse (!) 50   Temp 97.9 F (36.6 C) (Oral)   Ht 5' 7 (1.702 m)   Wt 170 lb (77.1 kg)   SpO2 98%   BMI 26.63 kg/m  Constitutional: Pt appears in NAD               HENT: Head: NCAT.                Right Ear: External ear normal.                 Left Ear: External ear normal.                Eyes: . Pupils are equal, round, and reactive to light. Conjunctivae and EOM are normal               Nose: without d/c or deformity               Neck: Neck supple. Gross normal ROM               Cardiovascular: Normal rate and regular rhythm.                 Pulmonary/Chest: Effort normal and breath sounds without rales or wheezing.                Abd:  Soft, NT, ND, + BS, no organomegaly               Neurological: Pt is alert. At baseline orientation, motor grossly intact, -+ mild tremor head               Skin: Skin is warm. No rashes, no other new lesions, LE edema - none               Psychiatric: Pt behavior is normal without  agitation   Micro: none  Cardiac tracings I have personally interpreted today:  none  Pertinent Radiological findings (summarize): none   Lab Results  Component Value Date   WBC 5.2 09/27/2023   HGB 15.5 09/27/2023   HCT 46.0 09/27/2023   PLT 353.0 09/27/2023   GLUCOSE 104 (H) 09/27/2023   CHOL 179 04/03/2024   TRIG 188.0 (H) 04/03/2024   HDL 52.10 04/03/2024   LDLDIRECT 79.0 09/27/2023   LDLCALC 89 04/03/2024   ALT 57 (H) 09/27/2023   AST 44 (H) 09/27/2023   NA 139 09/27/2023   K 4.1 09/27/2023   CL 104 09/27/2023   CREATININE 0.79 09/27/2023   BUN 12 09/27/2023   CO2 25 09/27/2023   TSH 0.97 09/27/2023   PSA 0.41 09/27/2023   HGBA1C 6.4 09/27/2023   MICROALBUR 1.3 09/27/2023   Assessment/Plan:  Chad [name] is a 54 y.o. Asian [4] male with  has a past medical history of AICD (automatic cardioverter/defibrillator) present, Brugada syndrome, DVT (deep venous thrombosis) (HCC), Dysrhythmia, Echocardiogram, History of Carotid Doppler ultrasound, Hyperlipidemia, Presence of permanent cardiac pacemaker, and Syncope.  Benign head tremor Also for neuro referral, consider primidone  Hyperglycemia Lab Results  Component Value Date   HGBA1C 6.4 09/27/2023   Stable, pt to continue current medical treatment  - diet, wt control   Hyperlipidemia Lab Results  Component Value Date   CHOL 179 04/03/2024   HDL 52.10 04/03/2024   LDLCALC 89 04/03/2024   LDLDIRECT 79.0 09/27/2023   TRIG 188.0 (H) 04/03/2024   CHOLHDL 3 04/03/2024   Improved, continue fenofibrate  145 mg qd  Low vitamin D  level Last vitamin D  Lab Results  Component Value Date   VD25OH 49.57 04/03/2024   Stable, cont oral replacement but at lower dose 5000 u qd  Gastroesophageal reflux disease Improved with change per GI to omperazole 40 mg every  day,  to f/u any worsening symptoms or concerns  Followup: Return in about 6 months (around 10/01/2024).  Lynwood Rush, MD 04/03/2024 7:11 PM [facility]     [1] No Known Allergies [2]  Current Outpatient Medications on File Prior to Visit  Medication Sig Dispense Refill   aspirin  EC 81 MG tablet Take 81 mg by mouth daily.     ketoconazole  (NIZORAL ) 2 % cream Apply 1 Application topically daily. 30 g 1   silver  sulfADIAZINE  (SILVADENE ) 1 % cream Apply 1 Application topically daily. 50 g 0   No current facility-administered medications on file prior to visit.

## 2024-04-03 NOTE — Patient Instructions (Addendum)
 You had the flu shot today  Ok to cut back on the OTC Vitamin D3 to 5000 units per day (not 10000 which is likely too much)  Please continue all other medications as before, and refills have been done   Please have the pharmacy call with any other refills you may need.  Please continue your efforts at being more active, low cholesterol diet, and weight control.  Please keep your appointments with your specialists as you may have planned  You will be contacted regarding the referral for: Neurology  Please go to the LAB at the blood drawing area for the tests to be done  You will be contacted by phone if any changes need to be made immediately.  Otherwise, you will receive a letter about your results with an explanation, but please check with MyChart first.  Please make an Appointment to return in 6 months, or sooner if needed

## 2024-04-03 NOTE — Assessment & Plan Note (Signed)
 Improved with change per GI to omperazole 40 mg every day,  to f/u any worsening symptoms or concerns

## 2024-04-03 NOTE — Assessment & Plan Note (Signed)
 Lab Results  Component Value Date   CHOL 179 04/03/2024   HDL 52.10 04/03/2024   LDLCALC 89 04/03/2024   LDLDIRECT 79.0 09/27/2023   TRIG 188.0 (H) 04/03/2024   CHOLHDL 3 04/03/2024   Improved, continue fenofibrate  145 mg qd

## 2024-04-03 NOTE — Assessment & Plan Note (Signed)
 Also for neuro referral, consider primidone

## 2024-04-29 ENCOUNTER — Ambulatory Visit

## 2024-05-12 ENCOUNTER — Ambulatory Visit

## 2024-05-14 ENCOUNTER — Ambulatory Visit: Admitting: Internal Medicine

## 2024-07-29 ENCOUNTER — Ambulatory Visit

## 2024-08-11 ENCOUNTER — Ambulatory Visit

## 2024-09-18 ENCOUNTER — Encounter: Admitting: Family Medicine

## 2024-10-28 ENCOUNTER — Ambulatory Visit

## 2024-11-10 ENCOUNTER — Ambulatory Visit

## 2025-01-27 ENCOUNTER — Ambulatory Visit
